# Patient Record
Sex: Male | Born: 1960 | Race: White | Hispanic: No | Marital: Married | Smoking: Former smoker
Health system: Southern US, Community
[De-identification: ages and names within clinical notes are randomized; demographics above are authoritative.]

## PROBLEM LIST (undated history)

## (undated) ENCOUNTER — Ambulatory Visit

## (undated) ENCOUNTER — Encounter

## (undated) ENCOUNTER — Encounter: Attending: Gastroenterology | Primary: Gastroenterology

## (undated) ENCOUNTER — Telehealth

## (undated) ENCOUNTER — Ambulatory Visit: Payer: BLUE CROSS/BLUE SHIELD | Attending: Gastroenterology | Primary: Gastroenterology

## (undated) ENCOUNTER — Telehealth
Attending: Pharmacist Clinician (PhC)/ Clinical Pharmacy Specialist | Primary: Pharmacist Clinician (PhC)/ Clinical Pharmacy Specialist

## (undated) ENCOUNTER — Encounter
Attending: Pharmacist Clinician (PhC)/ Clinical Pharmacy Specialist | Primary: Pharmacist Clinician (PhC)/ Clinical Pharmacy Specialist

## (undated) ENCOUNTER — Ambulatory Visit: Payer: BLUE CROSS/BLUE SHIELD

## (undated) ENCOUNTER — Ambulatory Visit: Attending: Hematology & Oncology | Primary: Hematology & Oncology

## (undated) ENCOUNTER — Encounter
Attending: Student in an Organized Health Care Education/Training Program | Primary: Student in an Organized Health Care Education/Training Program

## (undated) ENCOUNTER — Ambulatory Visit: Payer: PRIVATE HEALTH INSURANCE

## (undated) ENCOUNTER — Ambulatory Visit: Attending: Medical | Primary: Medical

## (undated) ENCOUNTER — Encounter: Attending: Internal Medicine | Primary: Internal Medicine

## (undated) DIAGNOSIS — E785 Hyperlipidemia, unspecified: Secondary | ICD-10-CM

## (undated) DIAGNOSIS — I4891 Unspecified atrial fibrillation: Secondary | ICD-10-CM

## (undated) DIAGNOSIS — K8020 Calculus of gallbladder without cholecystitis without obstruction: Secondary | ICD-10-CM

## (undated) DIAGNOSIS — K219 Gastro-esophageal reflux disease without esophagitis: Secondary | ICD-10-CM

## (undated) DIAGNOSIS — G510 Bell's palsy: Secondary | ICD-10-CM

## (undated) DIAGNOSIS — M109 Gout, unspecified: Secondary | ICD-10-CM

## (undated) DIAGNOSIS — I10 Essential (primary) hypertension: Secondary | ICD-10-CM

## (undated) DIAGNOSIS — M1990 Unspecified osteoarthritis, unspecified site: Secondary | ICD-10-CM

## (undated) DIAGNOSIS — G4730 Sleep apnea, unspecified: Secondary | ICD-10-CM

## (undated) DIAGNOSIS — K635 Polyp of colon: Secondary | ICD-10-CM

## (undated) DIAGNOSIS — K922 Gastrointestinal hemorrhage, unspecified: Secondary | ICD-10-CM

## (undated) DIAGNOSIS — D696 Thrombocytopenia, unspecified: Secondary | ICD-10-CM

## (undated) DIAGNOSIS — R519 Headache, unspecified: Secondary | ICD-10-CM

## (undated) DIAGNOSIS — E119 Type 2 diabetes mellitus without complications: Secondary | ICD-10-CM

## (undated) DIAGNOSIS — I864 Gastric varices: Secondary | ICD-10-CM

## (undated) DIAGNOSIS — G8929 Other chronic pain: Secondary | ICD-10-CM

## (undated) DIAGNOSIS — K7460 Unspecified cirrhosis of liver: Secondary | ICD-10-CM

## (undated) HISTORY — PX: BACK SURGERY: SHX140

## (undated) HISTORY — DX: Gastric varices: I86.4

## (undated) HISTORY — PX: OTHER SURGICAL HISTORY: SHX169

## (undated) HISTORY — PX: KNEE SURGERY: SHX244

## (undated) HISTORY — PX: CARDIAC VALVE REPLACEMENT: SHX585

## (undated) HISTORY — PX: ABDOMINAL SURGERY: SHX537

## (undated) HISTORY — PX: NASAL SINUS SURGERY: SHX719

## (undated) HISTORY — DX: Calculus of gallbladder without cholecystitis without obstruction: K80.20

## (undated) HISTORY — PX: HERNIA REPAIR: SHX51

## (undated) HISTORY — PX: COLON SURGERY: SHX602

## (undated) HISTORY — PX: APPENDECTOMY: SHX54

## (undated) HISTORY — PX: JOINT REPLACEMENT: SHX530

## (undated) HISTORY — PX: CARDIAC CATHETERIZATION: SHX172

---

## 2012-09-04 DIAGNOSIS — M109 Gout, unspecified: Secondary | ICD-10-CM | POA: Insufficient documentation

## 2012-09-04 DIAGNOSIS — F5101 Primary insomnia: Secondary | ICD-10-CM | POA: Insufficient documentation

## 2012-09-04 DIAGNOSIS — G43009 Migraine without aura, not intractable, without status migrainosus: Secondary | ICD-10-CM | POA: Insufficient documentation

## 2012-09-04 DIAGNOSIS — G4733 Obstructive sleep apnea (adult) (pediatric): Secondary | ICD-10-CM | POA: Insufficient documentation

## 2012-09-04 DIAGNOSIS — K219 Gastro-esophageal reflux disease without esophagitis: Secondary | ICD-10-CM | POA: Insufficient documentation

## 2012-09-04 DIAGNOSIS — Z96652 Presence of left artificial knee joint: Secondary | ICD-10-CM | POA: Insufficient documentation

## 2012-10-03 DIAGNOSIS — E1142 Type 2 diabetes mellitus with diabetic polyneuropathy: Secondary | ICD-10-CM | POA: Insufficient documentation

## 2012-10-03 DIAGNOSIS — E1165 Type 2 diabetes mellitus with hyperglycemia: Secondary | ICD-10-CM | POA: Insufficient documentation

## 2013-01-12 DIAGNOSIS — I10 Essential (primary) hypertension: Secondary | ICD-10-CM | POA: Insufficient documentation

## 2013-04-23 DIAGNOSIS — R7401 Elevation of levels of liver transaminase levels: Secondary | ICD-10-CM | POA: Insufficient documentation

## 2013-04-23 DIAGNOSIS — E1142 Type 2 diabetes mellitus with diabetic polyneuropathy: Secondary | ICD-10-CM | POA: Insufficient documentation

## 2013-10-29 DIAGNOSIS — H04121 Dry eye syndrome of right lacrimal gland: Secondary | ICD-10-CM | POA: Insufficient documentation

## 2014-01-23 DIAGNOSIS — R251 Tremor, unspecified: Secondary | ICD-10-CM | POA: Insufficient documentation

## 2014-01-23 DIAGNOSIS — E785 Hyperlipidemia, unspecified: Secondary | ICD-10-CM | POA: Insufficient documentation

## 2014-01-23 DIAGNOSIS — E1169 Type 2 diabetes mellitus with other specified complication: Secondary | ICD-10-CM | POA: Insufficient documentation

## 2015-05-30 DIAGNOSIS — M5416 Radiculopathy, lumbar region: Secondary | ICD-10-CM | POA: Insufficient documentation

## 2015-07-30 DIAGNOSIS — M79606 Pain in leg, unspecified: Secondary | ICD-10-CM | POA: Insufficient documentation

## 2015-07-30 DIAGNOSIS — M545 Low back pain, unspecified: Secondary | ICD-10-CM | POA: Insufficient documentation

## 2015-11-03 DIAGNOSIS — R29810 Facial weakness: Secondary | ICD-10-CM | POA: Insufficient documentation

## 2015-11-13 DIAGNOSIS — R519 Headache, unspecified: Secondary | ICD-10-CM | POA: Insufficient documentation

## 2016-04-16 DIAGNOSIS — L659 Nonscarring hair loss, unspecified: Secondary | ICD-10-CM | POA: Insufficient documentation

## 2018-10-15 ENCOUNTER — Other Ambulatory Visit: Payer: Self-pay

## 2018-10-15 ENCOUNTER — Ambulatory Visit
Admission: EM | Admit: 2018-10-15 | Discharge: 2018-10-15 | Disposition: A | Discharge status: Home / Self Care | Payer: 59

## 2018-10-15 ENCOUNTER — Encounter: Payer: Self-pay | Admitting: Emergency Medicine

## 2018-10-15 DIAGNOSIS — M5442 Lumbago with sciatica, left side: Secondary | ICD-10-CM | POA: Diagnosis not present

## 2018-10-15 HISTORY — DX: Type 2 diabetes mellitus without complications: E11.9

## 2018-10-15 MED ORDER — PREDNISONE 10 MG PO TABS: ORAL_TABLET | ORAL | 0 refills | Status: DC

## 2018-10-15 MED ORDER — CYCLOBENZAPRINE HCL 10 MG PO TABS: 10.0000 mg | ORAL_TABLET | Freq: Two times a day (BID) | ORAL | 0 refills | Status: DC | PRN

## 2018-10-15 NOTE — ED Provider Notes (Signed)
[facility] ____________________________________________  Time seen: Approximately 3:41 PM  I have reviewed the triage vital signs and the nursing notes.   HISTORY  Chief Complaint Back Pain   HPI Juan Garza is a 58 y.o. male presenting for evaluation of 3 weeks of low back pain.  Patient reports he has a history of chronic back pain with 2 previous lumbar surgeries.  Reports lumbar fusion L4-S1.  Reports of the last 3 weeks he has been having pain to the low back, sometimes more on the right side and sometimes more on the left.  States having some intermittent pain described as a burning pain going down left posterior leg.  Denies paresthesias.  Denies urinary bowel retention or incontinence.  Denies fall or direct injury.  States this is been gradual and not abrupt onset.  No fevers or rash.  States he has been taken over-the-counter Tylenol and ibuprofen which does help and it will ease up some but no resolution.  States feels like similar previous flareup.  Continues to eat and drink well.  Reports otherwise doing well.  No recent fever or sickness.  Denies chest pain or shortness of breath.   Past Medical History:  Diagnosis Date  . Diabetes mellitus without complication ([place])     There are no active problems to display for this patient.   Past Surgical History:  Procedure Laterality Date  . ABDOMINAL SURGERY    . APPENDECTOMY    . BACK SURGERY    . HERNIA REPAIR    . KNEE SURGERY Left      No current facility-administered medications for this encounter.   Current Outpatient Medications:  .  atorvastatin (LIPITOR) 10 MG tablet, Take 10 mg by mouth daily., Disp: , Rfl:  .  empagliflozin (JARDIANCE) 10 MG TABS tablet, Take 10 mg by mouth daily., Disp: , Rfl:  .  gabapentin (NEURONTIN) 300 MG capsule, Take 300 mg by mouth at bedtime., Disp: , Rfl:  .  glipiZIDE (GLUCOTROL) 10 MG tablet, Take 10 mg by mouth daily before breakfast., Disp: , Rfl:  .   lisinopril (ZESTRIL) 10 MG tablet, Take 10 mg by mouth daily., Disp: , Rfl:  .  omeprazole (PRILOSEC) 40 MG capsule, Take 40 mg by mouth daily., Disp: , Rfl:  .  propranolol (INDERAL) 20 MG tablet, Take 20 mg by mouth 2 (two) times daily., Disp: , Rfl:  .  sitaGLIPtin-metformin (JANUMET) 50-1000 MG tablet, Take 1 tablet by mouth daily., Disp: , Rfl:  .  cyclobenzaprine (FLEXERIL) 10 MG tablet, Take 1 tablet (10 mg total) by mouth 2 (two) times daily as needed for muscle spasms. Do not drive while taking as can cause drowsiness, Disp: 15 tablet, Rfl: 0 .  predniSONE (DELTASONE) 10 MG tablet, Start 60 mg po day one, then 50 mg po day two, taper by 10 mg daily until complete., Disp: 21 tablet, Rfl: 0  Allergies Imitrex [sumatriptan], Seroquel [quetiapine fumarate], and Adhesive [tape]  Family History  Problem Relation Age of Onset  . Atrial fibrillation Mother   . Diabetes Father     Social History Social History   Tobacco Use  . Smoking status: Former Research scientist (life sciences)  . Smokeless tobacco: Never Used  Substance Use Topics  . Alcohol use: Yes  . Drug use: Never    Review of Systems Constitutional: No fever. ENT: No sore throat. Cardiovascular: Denies chest pain. Respiratory: Denies shortness of breath. Gastrointestinal: No abdominal pain.  No nausea, no vomiting.  No diarrhea.  No constipation. Genitourinary: Negative for dysuria. Musculoskeletal: Positive for back pain. Skin: Negative for rash. Neurological: Negative for focal weakness or numbness.  ____________________________________________   PHYSICAL EXAM:  VITAL SIGNS: ED Triage Vitals  Enc Vitals Group     BP 10/15/18 1425 (!) 143/83     Pulse Rate 10/15/18 1425 70     Resp 10/15/18 1425 16     Temp 10/15/18 1425 98.4 F (36.9 C)     Temp Source 10/15/18 1425 Oral     SpO2 10/15/18 1425 99 %     Weight 10/15/18 1418 275 lb (124.7 kg)     Height 10/15/18 1418 6\' 2"  (1.88 m)     Head Circumference --      Peak Flow --       Pain Score 10/15/18 1418 6     Pain Loc --      Pain Edu? --      Excl. in [place]? --     Constitutional: Alert and oriented. Well appearing and in no acute distress. Eyes: Conjunctivae are normal.  ENT      Head: Normocephalic and atraumatic. Cardiovascular: Normal rate, regular rhythm. Grossly normal heart sounds.  Good peripheral circulation. Respiratory: Normal respiratory effort without tachypnea nor retractions. Breath sounds are clear and equal bilaterally. No wheezes, rales, rhonchi. Gastrointestinal: Soft and nontender. No CVA tenderness. Musculoskeletal: No midline cervical or thoracic tenderness palpation.  Bilateral pedal pulses equal and easily palpated.   Except: Mild midline lower lumbar tenderness palpation over surgical scar, no right tenderness, left paralumbar tenderness and over greater sciatic notch, no rash, no edema, minimal pain with right and left lumbar rotation, pain with flexion and extension but full range of motion present.  No saddle anesthesia.  Changes positions quickly.  No pain with standing bilateral knee lifts. Neurologic:  Normal speech and language. No gross focal neurologic deficits are appreciated. Speech is normal. No gait instability.  Skin:  Skin is warm, dry and intact. No rash noted. Psychiatric: Mood and affect are normal. Speech and behavior are normal. Patient exhibits appropriate insight and judgment   ___________________________________________   LABS (all labs ordered are listed, but only abnormal results are displayed)  Labs Reviewed - No data to display   PROCEDURES Procedures    INITIAL IMPRESSION / ASSESSMENT AND PLAN / ED COURSE  Pertinent labs & imaging results that were available during my care of the patient were reviewed by me and considered in my medical decision making (see chart for details).  Well-appearing patient.  No acute distress.  Low back pain acute on chronic.  No trauma, will defer x-ray, patient agrees.   Will treat with prednisone and PRN Flexeril, patient reports he is taken these medications in the past and done well.  Ice, heat, stretching.  Supportive care.Discussed indication, risks and benefits of medications with patient.  Discussed follow up with Primary care physician this week. Discussed follow up and return parameters including no resolution or any worsening concerns. Patient verbalized understanding and agreed to plan.   ____________________________________________   FINAL CLINICAL IMPRESSION(S) / ED DIAGNOSES  Final diagnoses:  Acute midline low back pain with left-sided sciatica     ED Discharge Orders         Ordered    predniSONE (DELTASONE) 10 MG tablet     10/15/18 1513    cyclobenzaprine (FLEXERIL) 10 MG tablet  2 times daily PRN     10/15/18 1513  Note: This dictation was prepared with Dragon dictation along with smaller phrase technology. Any transcriptional errors that result from this process are unintentional.         Marylene Land, NP 10/15/18 1549

## 2018-10-15 NOTE — Discharge Instructions (Signed)
Take medication as prescribed. Rest. Drink plenty of fluids. Ice/Heat.   Follow up with your primary care physician this week as needed. Return to Urgent care for new or worsening concerns.

## 2018-10-15 NOTE — ED Triage Notes (Signed)
Patient c/o lower back pain for the past 3 weeks.  Patient denies injury or fall.

## 2018-12-18 ENCOUNTER — Ambulatory Visit
Admission: EM | Admit: 2018-12-18 | Discharge: 2018-12-18 | Disposition: A | Discharge status: Home / Self Care | Payer: 59 | Attending: Family Medicine | Admitting: Family Medicine

## 2018-12-18 ENCOUNTER — Other Ambulatory Visit: Payer: Self-pay

## 2018-12-18 DIAGNOSIS — M5416 Radiculopathy, lumbar region: Secondary | ICD-10-CM | POA: Diagnosis not present

## 2018-12-18 MED ORDER — METAXALONE 800 MG PO TABS: 800.0000 mg | ORAL_TABLET | Freq: Three times a day (TID) | ORAL | 0 refills | Status: DC

## 2018-12-18 MED ORDER — MELOXICAM 15 MG PO TABS: 15.0000 mg | ORAL_TABLET | Freq: Every day | ORAL | 0 refills | Status: DC

## 2018-12-18 MED ORDER — KETOROLAC TROMETHAMINE 60 MG/2ML IM SOLN
60.0000 mg | Freq: Once | INTRAMUSCULAR | Status: AC
  Administered 2018-12-18: 60 mg via INTRAMUSCULAR

## 2018-12-18 NOTE — ED Triage Notes (Signed)
Pt states he fell 3 weeks ago onto concrete. Pain to middle of back and shooting down left leg.

## 2018-12-18 NOTE — ED Provider Notes (Signed)
[facility]    CSN: [id number] Arrival date & time: 12/18/18  0931      History   Chief Complaint Chief Complaint  Patient presents with  . Fall    HPI Juan Garza is a 58 y.o. male.   HPI  38-old male presents with low back pain with radiation into his left lower extremity.  He states that he fell 3 weeks ago onto concrete.  He had swelling of his left leg for quite some time but that has since subsided.  Now he is having continued back pain with radicular symptoms.  He is diabetic.  He has a previous history of back surgery.  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  He denies any bowel or bladder dysfunction       Past Medical History:  Diagnosis Date  . Diabetes mellitus without complication ([place])     There are no active problems to display for this patient.   Past Surgical History:  Procedure Laterality Date  . ABDOMINAL SURGERY    . APPENDECTOMY    . BACK SURGERY    . HERNIA REPAIR    . KNEE SURGERY Left        Home Medications    Prior to Admission medications   Medication Sig Start Date End Date Taking? Authorizing Provider  finasteride (PROSCAR) 5 MG tablet Take 5 mg by mouth daily.   Yes [provider]  atorvastatin (LIPITOR) 10 MG tablet Take 10 mg by mouth daily.    [provider]  empagliflozin (JARDIANCE) 10 MG TABS tablet Take 10 mg by mouth daily.    [provider]  gabapentin (NEURONTIN) 300 MG capsule Take 300 mg by mouth at bedtime.    [provider]  glipiZIDE (GLUCOTROL) 10 MG tablet Take 10 mg by mouth daily before breakfast.    [provider]  lisinopril (ZESTRIL) 10 MG tablet Take 10 mg by mouth daily.    [provider]  meloxicam (MOBIC) 15 MG tablet Take 1 tablet (15 mg total) by mouth daily. 12/18/18   Lorin Picket, PA-C  metaxalone (SKELAXIN) 800 MG tablet Take 1 tablet (800 mg total)  by mouth 3 (three) times daily. 12/18/18   Lorin Picket, PA-C  omeprazole (PRILOSEC) 40 MG capsule Take 40 mg by mouth daily.    [provider]  propranolol (INDERAL) 20 MG tablet Take 20 mg by mouth 2 (two) times daily.    [provider]  sitaGLIPtin-metformin (JANUMET) 50-1000 MG tablet Take 1 tablet by mouth daily.    [provider]    Family History Family History  Problem Relation Age of Onset  . Atrial fibrillation Mother   . Diabetes Father     Social History Social History   Tobacco Use  . Smoking status: Former Research scientist (life sciences)  . Smokeless tobacco: Never Used  Substance Use Topics  . Alcohol use: Yes    Comment: social  . Drug use: Never     Allergies   Imitrex [sumatriptan], Seroquel [quetiapine fumarate], and Adhesive [tape]   Review of Systems Review of Systems  Constitutional: Positive for activity change. Negative for appetite change, chills and fatigue.  Musculoskeletal: Positive for back pain and myalgias.  All other systems reviewed and are negative.    Physical Exam Triage Vital Signs ED Triage Vitals  Enc Vitals Group     BP 12/18/18 0949 (!) 163/81  Pulse Rate 12/18/18 0949 75     Resp 12/18/18 0949 18     Temp 12/18/18 0949 98.1 F (36.7 C)     Temp Source 12/18/18 0949 Oral     SpO2 12/18/18 0949 100 %     Weight 12/18/18 0945 281 lb (127.5 kg)     Height 12/18/18 0945 6\' 3"  (1.905 m)     Head Circumference --      Peak Flow --      Pain Score 12/18/18 0945 7     Pain Loc --      Pain Edu? --      Excl. in [place]? --    No data found.  Updated Vital Signs BP (!) 163/81 (BP Location: Right Arm)   Pulse 75   Temp 98.1 F (36.7 C) (Oral)   Resp 18   Ht 6\' 3"  (1.905 m)   Wt 281 lb (127.5 kg)   SpO2 100%   BMI 35.12 kg/m   Visual Acuity Right Eye Distance:   Left Eye Distance:   Bilateral Distance:    Right Eye Near:   Left Eye Near:    Bilateral Near:     Physical Exam Vitals signs and nursing  note reviewed.  Constitutional:      Appearance: Normal appearance.  Eyes:     Conjunctiva/sclera: Conjunctivae normal.  Neck:     Musculoskeletal: Normal range of motion and neck supple.  Cardiovascular:     Rate and Rhythm: Normal rate and regular rhythm.     Heart sounds: Normal heart sounds.  Pulmonary:     Effort: Pulmonary effort is normal.     Breath sounds: Normal breath sounds.  Musculoskeletal: Normal range of motion.     Comments: Examination of the lumbar spine shows the patient having limited range of motion with pain able to forward flex with his hands to the level of his mid thighs.  With resuming upright posture is more difficult.  Lateral flexion is painful bilaterally.  He is able to toe and heel walk.  DTR shows a absent knee jerk on the left.  It is 2+/4 on the right.  Sensation shows a hip esthesia to light touch over the medial left calf.  Straight leg raise testing on the right is cross positisve  causing left-sided low back and leg pain.  90 degrees bilaterally negative on the left.  Skin:    General: Skin is warm and dry.  Neurological:     General: No focal deficit present.     Mental Status: He is alert and oriented to person, place, and time.  Psychiatric:        Mood and Affect: Mood normal.        Behavior: Behavior normal.        Thought Content: Thought content normal.        Judgment: Judgment normal.      UC Treatments / Results  Labs (all labs ordered are listed, but only abnormal results are displayed) Labs Reviewed - No data to display  EKG   Radiology No results found.  Procedures Procedures (including critical care time)  Medications Ordered in UC Medications  ketorolac (TORADOL) injection 60 mg (60 mg Intramuscular Given 12/18/18 1026)    Initial Impression / Assessment and Plan / UC Course  I have reviewed the triage vital signs and the nursing notes.  Pertinent labs & imaging results that were available during my care of the  patient were reviewed by  me and considered in my medical decision making (see chart for details).   58 year old male presents with low back pain with radiation into the left lower extremity.  He fell approximate 3 weeks ago and has recovered mostly except for his back.  He was given an injection of Toradol.  I will also treat him conservatively with Mobic and Skelaxin.  He should follow-up with his primary care physician if he is not improving.   Final Clinical Impressions(s) / UC Diagnoses   Final diagnoses:  Left lumbar radiculopathy     Discharge Instructions     Avoid symptoms as much as possible.  Sitting lifting or bending are the worst things and are likely to exacerbate your back pain.  If you are not improving or worsening ,return to our clinic.    ED Prescriptions    Medication Sig Dispense Auth. Provider   meloxicam (MOBIC) 15 MG tablet Take 1 tablet (15 mg total) by mouth daily. 30 tablet Lorin Picket, PA-C   metaxalone (SKELAXIN) 800 MG tablet Take 1 tablet (800 mg total) by mouth 3 (three) times daily. 21 tablet Lorin Picket, PA-C     PDMP not reviewed this encounter.   Lorin Picket, PA-C 12/18/18 1735

## 2018-12-18 NOTE — Discharge Instructions (Addendum)
Avoid symptoms as much as possible.  Sitting lifting or bending are the worst things and are likely to exacerbate your back pain.  If you are not improving or worsening ,return to our clinic.

## 2018-12-22 ENCOUNTER — Ambulatory Visit
Admission: EM | Admit: 2018-12-22 | Discharge: 2018-12-22 | Disposition: A | Discharge status: Home / Self Care | Payer: 59 | Attending: Emergency Medicine | Admitting: Emergency Medicine

## 2018-12-22 ENCOUNTER — Encounter: Payer: Self-pay | Admitting: Emergency Medicine

## 2018-12-22 ENCOUNTER — Ambulatory Visit (INDEPENDENT_AMBULATORY_CARE_PROVIDER_SITE_OTHER): Payer: 59

## 2018-12-22 ENCOUNTER — Other Ambulatory Visit: Payer: Self-pay

## 2018-12-22 DIAGNOSIS — M545 Low back pain: Secondary | ICD-10-CM

## 2018-12-22 DIAGNOSIS — M25552 Pain in left hip: Secondary | ICD-10-CM

## 2018-12-22 DIAGNOSIS — M5442 Lumbago with sciatica, left side: Secondary | ICD-10-CM | POA: Diagnosis not present

## 2018-12-22 DIAGNOSIS — W19XXXD Unspecified fall, subsequent encounter: Secondary | ICD-10-CM

## 2018-12-22 DIAGNOSIS — M79605 Pain in left leg: Secondary | ICD-10-CM

## 2018-12-22 MED ORDER — TIZANIDINE HCL 4 MG PO TABS: 4.0000 mg | ORAL_TABLET | Freq: Three times a day (TID) | ORAL | 0 refills | Status: DC | PRN

## 2018-12-22 MED ORDER — NAPROXEN 500 MG PO TABS: 500.0000 mg | ORAL_TABLET | Freq: Two times a day (BID) | ORAL | 0 refills | Status: DC

## 2018-12-22 MED ORDER — METHYLPREDNISOLONE 4 MG PO TBPK: ORAL_TABLET | Freq: Every day | ORAL | 0 refills | Status: DC

## 2018-12-22 NOTE — ED Provider Notes (Signed)
HPI  SUBJECTIVE:  Juan Garza is a 58 y.o. male who presents with persistent left low back pain with radiation down his left leg.  He describes it as sharp, stabbing, constant, states that he can "barely walk" secondary to the pain.  Patient had a mechanical fall 3 weeks ago onto concrete, landing on his left knee and buttock.  States that his foot slipped out from underneath him.  Patient was seen here 4 days ago for this back pain,  given Toradol with significant improvement in his symptoms, sent home with Mobic and Skelaxin.  No imaging done on that visit.  He states that he has been taking the Mobic, Skelaxin, and taking Tylenol and ibuprofen as well.  Symptoms are better with lying flat, worse with sitting, standing, walking, lying on his right side.  No urinary or fecal incontinence, urinary retention, fevers, saddle anesthesia.  No leg weakness.  No numbness or tingling in his leg.  Patient states that his hip is "okay".  He has a past medical history of diabetes, hypertension, 2 lumbar spinal fusions, left knee replacement.  He is not on any anticoagulants or antiplatelet medications.  PMD: In [place].  None here.   Past Medical History:  Diagnosis Date  . Diabetes mellitus without complication [facility])     Past Surgical History:  Procedure Laterality Date  . ABDOMINAL SURGERY    . APPENDECTOMY    . BACK SURGERY    . HERNIA REPAIR    . KNEE SURGERY Left     Family History  Problem Relation Age of Onset  . Atrial fibrillation Mother   . Diabetes Father     Social History   Tobacco Use  . Smoking status: Former Research scientist (life sciences)  . Smokeless tobacco: Never Used  Substance Use Topics  . Alcohol use: Yes    Comment: social  . Drug use: Never    No current facility-administered medications for this encounter.   Current Outpatient Medications:  .  atorvastatin (LIPITOR) 10 MG tablet, Take 10 mg by mouth daily., Disp: , Rfl:  .  empagliflozin (JARDIANCE) 10 MG TABS tablet, Take 10 mg  by mouth daily., Disp: , Rfl:  .  finasteride (PROSCAR) 5 MG tablet, Take 5 mg by mouth daily., Disp: , Rfl:  .  gabapentin (NEURONTIN) 300 MG capsule, Take 300 mg by mouth at bedtime., Disp: , Rfl:  .  glipiZIDE (GLUCOTROL) 10 MG tablet, Take 10 mg by mouth daily before breakfast., Disp: , Rfl:  .  lisinopril (ZESTRIL) 10 MG tablet, Take 10 mg by mouth daily., Disp: , Rfl:  .  omeprazole (PRILOSEC) 40 MG capsule, Take 40 mg by mouth daily., Disp: , Rfl:  .  propranolol (INDERAL) 20 MG tablet, Take 20 mg by mouth 2 (two) times daily., Disp: , Rfl:  .  sitaGLIPtin-metformin (JANUMET) 50-1000 MG tablet, Take 1 tablet by mouth daily., Disp: , Rfl:  .  methylPREDNISolone (MEDROL DOSEPAK) 4 MG TBPK tablet, Take by mouth daily. Follow package instructions, Disp: 21 tablet, Rfl: 0 .  naproxen (NAPROSYN) 500 MG tablet, Take 1 tablet (500 mg total) by mouth 2 (two) times daily., Disp: 20 tablet, Rfl: 0 .  tiZANidine (ZANAFLEX) 4 MG tablet, Take 1 tablet (4 mg total) by mouth every 8 (eight) hours as needed for muscle spasms., Disp: 30 tablet, Rfl: 0  Allergies  Allergen Reactions  . Imitrex [Sumatriptan] Other (See Comments)    shaking  . Seroquel [Quetiapine Fumarate] Other (See Comments)  Leg spasms  . Adhesive [Tape] Rash     ROS  As noted in HPI.   Physical Exam  BP (!) 149/78 (BP Location: Left Arm)   Pulse 64   Temp 97.7 F (36.5 C) (Oral)   Resp 16   Ht 6\' 2"  (1.88 m)   Wt 127 kg   SpO2 100%   BMI 35.95 kg/m   Constitutional: Well developed, well nourished, appears uncomfortable. Eyes:  EOMI, conjunctiva normal bilaterally HENT: Normocephalic, atraumatic,mucus membranes moist Respiratory: Normal inspiratory effort Cardiovascular: Normal rate GI: nondistended skin: No rash, skin intact Musculoskeletal: Healed L-spine surgical scar. - paralumbar, QL tenderness,  -paralumbar muscle spasm. No bony tenderness.  No Tenderness over the SI joint.  Tenderness left lateral hip and  along IT band. intact DP pulses, CR<2 secs all digits bilaterally.  Pain with left internal hip rotation, flexion extension at the knee, flexion extension at the hip.  SLR positive left side.  Sensation baseline light touch bilaterally for Pt, DTR's KJ intact bilaterally, slightly diminished on the left.  Motor symmetric bilateral 5/5 hip flexion, quadriceps, hamstrings, EHL, foot dorsiflexion, foot plantarflexion, gait antalgic but without apparent new ataxia.  Knee nontender. Neurologic: Alert & oriented x 3, no focal neuro deficits Psychiatric: Speech and behavior appropriate   ED Course   Medications - No data to display  Orders Placed This Encounter  Procedures  . DG Hip Unilat With Pelvis 2-3 Views Left    Standing Status:   Standing    Number of Occurrences:   1    Order Specific Question:   Symptom/Reason for Exam    Answer:   Leg pain, diffuse, left [[id number]]    Order Specific Question:   Call Results- Best Contact Number?    Answer:   [phone]    Order Specific Question:   Radiology Contrast Protocol - do NOT remove file path    Answer:   [email or web address]  . DG Lumbar Spine Complete    Standing Status:   Standing    Number of Occurrences:   1    Order Specific Question:   Reason for Exam (SYMPTOM  OR DIAGNOSIS REQUIRED)    Answer:   fall 3 weeks ago L radicular leg pain. s/p 2 lumbar fusions    No results found for this or any previous visit (from the past 24 hour(s)). Dg Lumbar Spine Complete  Result Date: 12/22/2018 CLINICAL DATA:  Pain status post fall EXAM: LUMBAR SPINE - COMPLETE 4+ VIEW COMPARISON:  None. FINDINGS: The patient is status post prior L3 through L5 posterior fusion. The hardware appears intact. There are interbody spacers at the L3-L4 and L4-L5 levels. There is no acute displaced fracture. No dislocation. Multilevel facet arthrosis is noted throughout the lower lumbar spine. IMPRESSION: No acute osseous  abnormality. Electronically Signed   By: Constance Holster M.D.   On: 12/22/2018 18:24   Dg Hip Unilat With Pelvis 2-3 Views Left  Result Date: 12/22/2018 CLINICAL DATA:  Left hip pain after fall 3 weeks ago. Intense occasion over the last 2 days. EXAM: DG HIP (WITH OR WITHOUT PELVIS) 2-3V LEFT COMPARISON:  None. FINDINGS: Amorphous calcification just above the left greater trochanter. Spurring of both femoral heads. Preserved articular space in the hips. No well-defined fracture. IMPRESSION: 1. Amorphous calcification just above the left greater trochanter, suspicious for calcific tendinopathy. 2. Mild degenerative spurring of both femoral heads. 3. No acute bony findings are identified. Electronically Signed   By: Cindra Eves.D.  On: 12/22/2018 18:23    ED Clinical Impression  1. Acute left-sided low back pain with left-sided sciatica   2. Leg pain, diffuse, left   3. Fall, subsequent encounter      ED Assessment/Plan  Previous records reviewed.  As noted in HPI.  Will x-ray L-spine and hip.  Will reevaluate.  Patient took ibuprofen and Mobic today within 6 hours of evaluation, with holding Toradol.  [place] Narcotic database reviewed for this patient, and feel that the risk/benefit ratio today is favorable for proceeding with a prescription for controlled substance.  No opiate prescriptions since 2019.  Reviewed imaging independently. L-spine hardware intact.  No acute displaced fracture.  No acute bony findings left hip.  See radiology report for full details.  Patient has responded to NSAIDs in the past, states that the Toradol that he got last time really helped him.   Will discontinue the Mobic, try Naprosyn 500 mg twice daily with a Tylenol containing product.  will discontinue the Skelaxin, try Zanaflex.  We discussed the risks and benefits of doing a steroid taper, patient agreed to try a taper.  Advised him that this will elevate his sugars.  He will need to follow-up with a  neurosurgeon if not getting any better.  Providing information for several practices in [place].  Work note return on Tuesday.  He is not working over the weekend.   will also provide primary care list for ongoing care.  Discussed imaging, MDM, treatment plan, and plan for follow-up with patient. Discussed sn/sx that should prompt return to the ED. patient agrees with plan.   Meds ordered this encounter  Medications  . tiZANidine (ZANAFLEX) 4 MG tablet    Sig: Take 1 tablet (4 mg total) by mouth every 8 (eight) hours as needed for muscle spasms.    Dispense:  30 tablet    Refill:  0  . naproxen (NAPROSYN) 500 MG tablet    Sig: Take 1 tablet (500 mg total) by mouth 2 (two) times daily.    Dispense:  20 tablet    Refill:  0  . methylPREDNISolone (MEDROL DOSEPAK) 4 MG TBPK tablet    Sig: Take by mouth daily. Follow package instructions    Dispense:  21 tablet    Refill:  0    *This clinic note was created using Dragon dictation software. Therefore, there may be occasional mistakes despite careful proofreading.   ?    Melynda Ripple, MD 12/22/18 1900

## 2018-12-22 NOTE — Discharge Instructions (Addendum)
Your x-rays were negative for any acute fractures today.  The hardware seems to be in place.  Stop the Mobic and the Skelaxin.  We are going to try 500 mg of Naprosyn twice a day.  You may take a Tylenol containing product 4 times a day.  Take 2 extra strength, or 1000 mg of Tylenol with the Naprosyn for mild to moderate pain or 1-2 Norco for severe pain.  You can take either plain Tylenol or d the Norco an additional 2 times a day.  Do not take Tylenol and Norco as they both have Tylenol in them and too much Tylenol can hurt your liver.  The steroids will elevate your glucose.  Keep a close eye on this.  Stop the Skelaxin, try the Zanaflex.  Fortunately, there are no neurosurgeons in [place], however there are several excellent practices in [place].  Give them a call if you are not better in 5 to 7 days.  Here is a list of primary care providers who are taking new patients:  Dr. Otilio Miu, Dr. Adline Potter [address] [place] [address] [place] [address]  [phone]  [facility] [address] [place], [place] [address] [phone]  [facility]  [phone] [place], [place] [address]  Here are clinics/ other resources who will see you if you do not have insurance. Some have certain criteria that you must meet. Call them and find out what they are:  [facility]: [address], [place], [place] [address] Phone: [phone] Hours: First and Third Saturdays of each Month, 9 a.m. - 1 p.m.  [facility]: [address] [place], [place], [place] [address] Phone: [phone] Hours: Tuesday, 4 p.m. - 8 p.m. Thursday, 1 p.m. - 8 p.m. Wednesday, 9 a.m. - [facility] [address] [place], [place] [address] Phone: [phone] Pharmacy Phone Number: [phone] Dental Phone Number: [phone] [place] Help:  [phone]  Dental Hours: Monday - Thursday, 8 a.m. - 6 p.m.  [facility] [address] [place], [place] [address] Phone: [phone] Pharmacy Phone Number: [phone] [facility] Insurance Help: [phone]  [facility] [address] [place], [place] [address] Phone: [phone] Pharmacy Phone Number: [phone] [facility] Insurance Help: [phone]  [facility] [address] [place], [place] [address] Phone: [phone] [facility] Insurance Help: [phone]   [address] [place], [place] [address] Phone: [phone]  Go to www.goodrx.com to look up your medications. This will give you a list of where you can find your prescriptions at the most affordable prices. Or ask the pharmacist what the cash price is, or if they have any other discount programs available to help make your medication more affordable. This can be less expensive than what you would pay with insurance.

## 2018-12-22 NOTE — ED Triage Notes (Signed)
Patient c/o left leg pain that has not improved since he was seen back on 11/9 for fall and left leg injury.

## 2018-12-25 ENCOUNTER — Encounter
Admit: 2018-12-25 | Discharge: 2018-12-26 | Disposition: A | Discharge status: Home / Self Care | Payer: PRIVATE HEALTH INSURANCE

## 2018-12-25 DIAGNOSIS — M549 Dorsalgia, unspecified: Principal | ICD-10-CM

## 2018-12-25 MED ORDER — OXYCODONE 5 MG TABLET
ORAL_TABLET | Freq: Four times a day (QID) | ORAL | 0 refills | 2 days | Status: CP | PRN
Start: 2018-12-25 — End: 2018-12-30

## 2018-12-25 MED ORDER — LIDOCAINE 5 % TOPICAL PATCH
MEDICATED_PATCH | TRANSDERMAL | 0 refills | 14.00000 days | Status: CP
Start: 2018-12-25 — End: 2019-01-24

## 2019-01-03 DIAGNOSIS — R29898 Other symptoms and signs involving the musculoskeletal system: Secondary | ICD-10-CM | POA: Insufficient documentation

## 2019-01-03 DIAGNOSIS — Z981 Arthrodesis status: Secondary | ICD-10-CM | POA: Insufficient documentation

## 2019-02-09 HISTORY — PX: LUMBAR DISC SURGERY: SHX700

## 2019-08-08 ENCOUNTER — Encounter: Payer: Self-pay | Admitting: Emergency Medicine

## 2019-08-08 ENCOUNTER — Ambulatory Visit
Admission: EM | Admit: 2019-08-08 | Discharge: 2019-08-08 | Disposition: A | Discharge status: Home / Self Care | Payer: Commercial Managed Care - PPO | Attending: Family Medicine | Admitting: Family Medicine

## 2019-08-08 ENCOUNTER — Other Ambulatory Visit: Payer: Self-pay

## 2019-08-08 DIAGNOSIS — E11628 Type 2 diabetes mellitus with other skin complications: Secondary | ICD-10-CM | POA: Diagnosis not present

## 2019-08-08 DIAGNOSIS — L089 Local infection of the skin and subcutaneous tissue, unspecified: Secondary | ICD-10-CM

## 2019-08-08 MED ORDER — CLINDAMYCIN HCL 150 MG PO CAPS: 450.0000 mg | ORAL_CAPSULE | Freq: Three times a day (TID) | ORAL | 0 refills | Status: AC

## 2019-08-08 MED ORDER — MUPIROCIN 2 % EX OINT: 1.0000 "application " | TOPICAL_OINTMENT | Freq: Two times a day (BID) | CUTANEOUS | 0 refills | Status: AC

## 2019-08-08 NOTE — Discharge Instructions (Signed)
Keep a close eye.  Antibiotic as prescribed.  If worsens, go to the ER.  Take care  Dr. Lacinda Axon

## 2019-08-08 NOTE — ED Provider Notes (Signed)
[facility]    CSN: [id number] Arrival date & time: 08/08/19  6237  History   Chief Complaint Chief Complaint  Patient presents with  . Toe Pain    right great toe   HPI  59 year old male presents with the above complaint.  Patient is diabetic.  Patient reports that over the past 4 to 5 days he has had what he believes is an infection of his right great toe.  He has an area of callus with a central opening and some drainage.  Surrounding erythema.  Rates his pain as 1/10 in severity.  He has been applying topical antibiotic ointment without resolution.  No fever.  No other associated symptoms.  No other complaints.  Past Medical History:  Diagnosis Date  . Diabetes mellitus without complication ([place])   Hyperlipidemia  hypertension  Past Surgical History:  Procedure Laterality Date  . ABDOMINAL SURGERY    . APPENDECTOMY    . BACK SURGERY    . HERNIA REPAIR    . KNEE SURGERY Left     Home Medications    Prior to Admission medications   Medication Sig Start Date End Date Taking? Authorizing Provider  atorvastatin (LIPITOR) 10 MG tablet Take 10 mg by mouth daily.   Yes [provider]  dutasteride (AVODART) 0.5 MG capsule Take by mouth. 12/18/18  Yes [provider]  empagliflozin (JARDIANCE) 10 MG TABS tablet Take 10 mg by mouth daily.   Yes [provider]  eszopiclone (LUNESTA) 2 MG TABS tablet Take by mouth. 04/26/19  Yes [provider]  finasteride (PROSCAR) 5 MG tablet Take 5 mg by mouth daily.   Yes [provider]  glipiZIDE (GLUCOTROL) 10 MG tablet Take 10 mg by mouth daily before breakfast.   Yes [provider]  lisinopril (ZESTRIL) 10 MG tablet Take 10 mg by mouth daily.   Yes [provider]  naproxen (NAPROSYN) 500 MG tablet Take 1 tablet (500 mg total) by mouth 2 (two) times daily. 12/22/18  Yes Melynda Ripple, MD  omeprazole (PRILOSEC) 40 MG capsule Take 40 mg by mouth daily.   Yes  [provider]  pregabalin (LYRICA) 75 MG capsule Take by mouth. 01/18/19  Yes [provider]  propranolol (INDERAL) 20 MG tablet Take 20 mg by mouth 2 (two) times daily.   Yes [provider]  Semaglutide,0.25 or 0.5MG /DOS, (OZEMPIC, 0.25 OR 0.5 MG/DOSE,) 2 MG/1.5ML SOPN Inject into the skin. 05/22/19  Yes [provider]  clindamycin (CLEOCIN) 150 MG capsule Take 3 capsules (450 mg total) by mouth 3 (three) times daily for 10 days. 08/08/19 08/18/19  Coral Spikes, DO  mupirocin ointment (BACTROBAN) 2 % Apply 1 application topically 2 (two) times daily for 7 days. 08/08/19 08/15/19  Coral Spikes, DO  gabapentin (NEURONTIN) 300 MG capsule Take 300 mg by mouth at bedtime.  08/08/19  [provider]  sitaGLIPtin-metformin (JANUMET) 50-1000 MG tablet Take 1 tablet by mouth daily.  08/08/19  [provider]    Family History Family History  Problem Relation Age of Onset  . Atrial fibrillation Mother   . Diabetes Father     Social History Social History   Tobacco Use  . Smoking status: Former Research scientist (life sciences)  . Smokeless tobacco: Never Used  Vaping Use  . Vaping Use: Never used  Substance Use Topics  . Alcohol use: Yes    Comment: social  . Drug use: Never     Allergies   Imitrex [  sumatriptan], Seroquel [quetiapine fumarate], and Adhesive [tape]   Review of Systems Review of Systems  Constitutional: Negative for fever.  Skin: Positive for wound.   Physical Exam Triage Vital Signs ED Triage Vitals  Enc Vitals Group     BP 08/08/19 0856 134/69     Pulse Rate 08/08/19 0856 64     Resp 08/08/19 0856 18     Temp 08/08/19 0856 98.2 F (36.8 C)     Temp Source 08/08/19 0856 Oral     SpO2 08/08/19 0856 98 %     Weight 08/08/19 0853 260 lb (117.9 kg)     Height 08/08/19 0853 6\' 2"  (1.88 m)     Head Circumference --      Peak Flow --      Pain Score 08/08/19 0853 1     Pain Loc --      Pain Edu? --      Excl. in [place]? --    Updated  Vital Signs BP 134/69 (BP Location: Right Arm)   Pulse 64   Temp 98.2 F (36.8 C) (Oral)   Resp 18   Ht 6\' 2"  (1.88 m)   Wt 117.9 kg   SpO2 98%   BMI 33.38 kg/m   Visual Acuity Right Eye Distance:   Left Eye Distance:   Bilateral Distance:    Right Eye Near:   Left Eye Near:    Bilateral Near:     Physical Exam Vitals and nursing note reviewed.  Constitutional:      General: He is not in acute distress.    Appearance: Normal appearance. He is not ill-appearing.  HENT:     Head: Normocephalic and atraumatic.  Eyes:     General:        Right eye: No discharge.        Left eye: No discharge.     Conjunctiva/sclera: Conjunctivae normal.  Pulmonary:     Effort: Pulmonary effort is normal. No respiratory distress.  Feet:     Comments: Right great toe -medial aspect of the toe with callus with a central opening.  Small amount of pus exuded with pressure.  Surrounding erythema. Neurological:     Mental Status: He is alert.  Psychiatric:        Mood and Affect: Mood normal.        Behavior: Behavior normal.    UC Treatments / Results  Labs (all labs ordered are listed, but only abnormal results are displayed) Labs Reviewed - No data to display  EKG   Radiology No results found.  Procedures Procedures (including critical care time)  Medications Ordered in UC Medications - No data to display  Initial Impression / Assessment and Plan / UC Course  I have reviewed the triage vital signs and the nursing notes.  Pertinent labs & imaging results that were available during my care of the patient were reviewed by me and considered in my medical decision making (see chart for details).    59 year old male presents with a diabetic foot infection.  Treating with clindamycin and Bactroban ointment.  Final Clinical Impressions(s) / UC Diagnoses   Final diagnoses:  Diabetic foot infection [facility])     Discharge Instructions     Keep a close eye.  Antibiotic as  prescribed.  If worsens, go to the ER.  Take care  Dr. Lacinda Axon    ED Prescriptions    Medication Sig Dispense Auth. Provider   clindamycin (CLEOCIN) 150 MG capsule Take 3  capsules (450 mg total) by mouth 3 (three) times daily for 10 days. 90 capsule Myria Steenbergen G, DO   mupirocin ointment (BACTROBAN) 2 % Apply 1 application topically 2 (two) times daily for 7 days. 22 g Coral Spikes, DO     PDMP not reviewed this encounter.   Coral Spikes, [place] 08/08/19 [phone]

## 2019-08-08 NOTE — ED Triage Notes (Signed)
Patient c/o infection in right great toe x 4-5 days. He thinks it may have started with a spider bite.

## 2019-10-09 DIAGNOSIS — L649 Androgenic alopecia, unspecified: Secondary | ICD-10-CM | POA: Insufficient documentation

## 2019-10-09 DIAGNOSIS — Z79899 Other long term (current) drug therapy: Secondary | ICD-10-CM | POA: Insufficient documentation

## 2019-11-26 NOTE — Progress Notes (Signed)
[facility]  [address] [place], [place] [address] Phone: [phone]  Fax: [phone]   Clinic Day:  11/27/2019  Referring physician: Toni Arthurs, NP  Chief Complaint: Juan Garza is a 59 y.o. male with leukopenia and thrombocytopenia who is referred in consultation by Toni Arthurs, NP for assessment and management.   HPI: The patient saw Toni Arthurs, NP on 11/14/2019 for a follow up regarding foot pain. Hematocrit was 43.3, hemoglobin 14.2, MCV 85.9, platelets 77,000, WBC 3,600 (ANC 2,000).  CBC followed: 04/28/2010:  Hematocrit 37.4, hemoglobin 13.0, MCV 87.8, platelets 173,000, WBC 12,700. 12/29/2012:  Hematocrit 47.9, hemoglobin 16.1, MCV 92.6, platelets 164,000, WBC   5,890. 01/16/2014:  Hematocrit 48.7, hemoglobin 17.0, MCV 87.4, platelets 135,000, WBC   5,820. 12/04/2014:  Hematocrit 49.3, hemoglobin 17.3, NCV 88.2, platelets 158,000, WBC   7,700. 07/22/2015:  Hematocrit 39.4, hemoglobin 13.8, MCV 87.9, platelets 122,000, WBC   8,300. 09/24/2016:  Hematocrit 48.0, hemoglobin 16.7, MCV 87.8, platelets 103,000, WBC   5,930. 12/26/2017:  Hematocrit 45.8, hemoglobin 15.3, MCV 88.8, platelets   78,000, WBC   4,480. 05/28/2019:  Hematocrit 36.6, hemoglobin 10.7, MCV 77.1, platelets   84,000, WBC   3,290 ([place] 1,960). 10/17/2019:  Hematocrit 44.1, hemoglobin 14.0, MCV 85.5, platelets   78,000, WBC   3,800 ([place] 2,180).  11/14/2019:  Hematocrit 43.3, hemoglobin 14.2, MCV 85.9, platelets   77,000, WBC   3,600 (ANC 2,000).  Additional labs:  Ferritin was 9 on 05/28/2019 and 19 on 10/17/2019. Iron saturation was 6% and TIBC was 467 on 05/28/2019. Reticulocyte count was 1.34% on 10/17/2019. Sed rate was 2 and CRP was 1 on 11/14/2019.  SPEP was normal on 10/17/2019. Vitamin B12 was 838 on 10/17/2019.  At home hemoccult test was positive on 10/25/2019.  The patient had Bell's palsy in 2017. He has a history of upper GI bleed felt related to  ibuprofen in 06/2019.  The patient's father had a stroke.  Symptomatically, he has been "ok".  He reports occasional headaches, appetite loss, weight loss (40 lbs in 6 months), nosebleeds once per week, reflux, occasional blood in the stool, right/middle abdominal pain, dark stools, and nausea. He has pain in his left 2nd finger and hips. He denies any issues with infections.  The patient denies fevers, sweats, changes in vision, runny nose, sore throat, cough, shortness of breath, chest pain, palpitations, vomiting, diarrhea, urinary symptoms, skin changes, numbness, weakness, balance or coordination problems, and bleeding of any kind.  The patient follows a gluten-free diet. He eats a lot of rice and beans, cheesecakes, applesauce, ice cream, and milk. He eats red meat 3-4 x per week. He started taking oral iron once daily in the beginning of 10/2019. He had previously taken it twice daily. He denies ice pica or any other cravings. He reports early satiety.  He had a problem with one of the discs in his back about a year ago and was taking a lot of ibuprofen and Tylenol. He went in for routine labs in 05/2019 and "everything came back low." He has an appointment with Dr. Alice Reichert next week.  He fractured his right foot in 10/2019 but it was not diagnosed until 2 weeks ago.  He had a partial colectomy and appendectomy in 2007 due to a polyp.  He does not take any herbal products. He has diabetes. He has never had a transfusion. He denies a family history or blood disorders or cancer.  He denies a history of hepatitis. He consents  to HIV and hepatitis testing.   Past Medical History:  Diagnosis Date  . Diabetes mellitus without complication [place])     Past Surgical History:  Procedure Laterality Date  . ABDOMINAL SURGERY    . APPENDECTOMY    . BACK SURGERY    . HERNIA REPAIR    . KNEE SURGERY Left     Family History  Problem Relation Age of Onset  . Atrial fibrillation Mother   .  Diabetes Father     Social History:  reports that he has quit smoking. He has never used smokeless tobacco. He reports current alcohol use. He reports that he does not use drugs. He smoked 1.5 packs per day for ~20 years and quit in 1996. He denies drug use. He drinks 3 drinks weekly, usually rum and coke. 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 He lives in [place]. 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 The patient is alone today.  Allergies:  Allergies  Allergen Reactions  . Imitrex [Sumatriptan] Other (See Comments)    shaking  . Seroquel [Quetiapine Fumarate] Other (See Comments)    Leg spasms  . Adhesive [Tape] Rash    Current Medications: Current Outpatient Medications  Medication Sig Dispense Refill  . atorvastatin (LIPITOR) 10 MG tablet Take 10 mg by mouth daily.    Marland Kitchen dutasteride (AVODART) 0.5 MG capsule Take by mouth.    . empagliflozin (JARDIANCE) 10 MG TABS tablet Take 10 mg by mouth daily.    . eszopiclone (LUNESTA) 2 MG TABS tablet Take by mouth.    . finasteride (PROSCAR) 5 MG tablet Take 5 mg by mouth daily.    Marland Kitchen lisinopril (ZESTRIL) 10 MG tablet Take 10 mg by mouth daily.    Marland Kitchen omeprazole (PRILOSEC) 40 MG capsule Take 40 mg by mouth daily.    . pregabalin (LYRICA) 75 MG capsule Take by mouth.    . propranolol (INDERAL) 20 MG tablet Take 20 mg by mouth 2 (two) times daily.    . Semaglutide,0.25 or 0.5MG /DOS, (OZEMPIC, 0.25 OR 0.5 MG/DOSE,) 2 MG/1.5ML SOPN Inject into the skin.    Marland Kitchen glipiZIDE (GLUCOTROL) 10 MG tablet Take 10 mg by mouth daily before breakfast. (Patient not taking: Reported on 11/27/2019)    . naproxen (NAPROSYN) 500 MG tablet Take 1 tablet (500 mg total) by mouth 2 (two) times daily. (Patient not taking: Reported on 11/27/2019) 20 tablet 0   No current facility-administered medications for this visit.    Review of Systems  Constitutional: Positive for weight loss (40 lbs). Negative for chills, diaphoresis, fever and malaise/fatigue.  HENT:  Positive for nosebleeds (once a week). Negative for congestion, ear discharge, ear pain, hearing loss, sinus pain, sore throat and tinnitus.   Eyes: Negative for blurred vision and double vision.  Respiratory: Negative for cough, hemoptysis, sputum production and shortness of breath.   Cardiovascular: Negative for chest pain, palpitations and leg swelling.  Gastrointestinal: Positive for abdominal pain (center or right side), blood in stool (occasional), heartburn (reflux), melena (on oral iron) and nausea. Negative for constipation, diarrhea and vomiting.       Poor appetite. Early satiety. Denies ice pica. Gluten-free diet.  Genitourinary: Negative for dysuria, frequency, hematuria and urgency.  Musculoskeletal: Positive for back pain and joint pain (hips, left 2nd finger). Negative for falls, myalgias and neck pain.       Fractured foot.  Skin: Negative for itching and rash.  Neurological: Positive for headaches. Negative for dizziness, tingling,  sensory change and weakness.  Endo/Heme/Allergies: Does not bruise/bleed easily.  Psychiatric/Behavioral: Negative for depression and memory loss. The patient is not nervous/anxious and does not have insomnia.   All other systems reviewed and are negative.  Performance status (ECOG): 1  Vitals Blood pressure 133/83, pulse 77, temperature (!) 97.1 F (36.2 C), temperature source Tympanic, resp. rate 18, height 6\' 2"  (1.88 m), weight 257 lb 11.5 oz (116.9 kg), SpO2 99 %.   Physical Exam Vitals and nursing note reviewed.  Constitutional:      General: He is not in acute distress.    Appearance: He is not diaphoretic.  HENT:     Head: Normocephalic and atraumatic.     Mouth/Throat:     Mouth: Mucous membranes are moist.     Pharynx: Oropharynx is clear.  Eyes:     General: No scleral icterus.    Extraocular Movements: Extraocular movements intact.     Conjunctiva/sclera: Conjunctivae normal.     Pupils: Pupils are equal, round, and reactive  to light.  Cardiovascular:     Rate and Rhythm: Normal rate and regular rhythm.     Heart sounds: Normal heart sounds. No murmur heard.   Pulmonary:     Effort: Pulmonary effort is normal. No respiratory distress.     Breath sounds: Normal breath sounds. No wheezing or rales.  Chest:     Chest wall: No tenderness.  Abdominal:     General: Bowel sounds are normal. There is no distension.     Palpations: Abdomen is soft. There is no hepatomegaly, splenomegaly or mass.     Tenderness: There is abdominal tenderness (RUQ). There is no guarding or rebound.  Musculoskeletal:        General: No swelling or tenderness. Normal range of motion.     Cervical back: Normal range of motion and neck supple.     Comments: Right foot fracture, wearing boot. Left axillary tenderness.  Lymphadenopathy:     Head:     Right side of head: No preauricular, posterior auricular or occipital adenopathy.     Left side of head: No preauricular, posterior auricular or occipital adenopathy.     Cervical: No cervical adenopathy.     Upper Body:     Right upper body: No supraclavicular or axillary adenopathy.     Left upper body: No supraclavicular or axillary adenopathy.     Lower Body: No right inguinal adenopathy. No left inguinal adenopathy.  Skin:    General: Skin is warm and dry.     Comments: Bruise on upper abdomen.  Neurological:     Mental Status: He is alert and oriented to person, place, and time.  Psychiatric:        Behavior: Behavior normal.        Thought Content: Thought content normal.        Judgment: Judgment normal.    No visits with results within 3 Day(s) from this visit.  Latest known visit with results is:  No results found for any previous visit.    Assessment:  Juan Garza is a 59 y.o. male with leukopenia and thrombocytopenia.  He denies any new medication or herbal product.  CBC on 11/14/2019 revealed a hematocrit 43.3, hemoglobin 14.2, MCV 85.9, platelets 77,000, WBC 3,600  (ANC 2,000).  SPEP was normal on 10/17/2019. Vitamin B12 was 838 on 10/17/2019.  TSH and free T4 were normal on 04/16/2019.  He has iron deficiency.  He has a history of upper GI bleed  felt related to ibuprofen in 06/2019. Ferritin was 9 on 05/28/2019 and 19 on 10/17/2019. Iron saturation was 6% and TIBC was 467 on 05/28/2019. Reticulocyte count was 1.34% on 10/17/2019. Sed rate was 2 and CRP was 1 on 11/14/2019.  At home hemoccult test was positive on 10/25/2019.  The patient received the [employer]good COVID-19 vaccine on 05/02/2019 and 05/23/2019.  Symptomatically, he notes appetite loss, weight loss (40 lbs in 6 months), nosebleeds once per week, occasional blood in the stool, right/middle abdominal pain, dark stools, and nausea.  Exam reveals no adenopathy or hepatosplenomegaly.  He follows a gluten-free diet. Exam reveals no adenopathy or hepatosplenomegaly.   Plan: 1.   Labs today:  CBC with diff, platelet count in a blue top tube, CMP, PT, PTT, hepatitis B core antibody total, hepatitis C antibody, HIV testing, ANA with reflex, H pylori serologies, folate, TSH, free T4, LDH. 2.   Peripheral smear for path review. 3.   Thrombocytopenia and leukopenia  Differential diagnosis includes: pseudothrombocytopenia, infections, autoimmune, medication, herbal products, alcohol, liver disease, vitamin deficiencies, marrow replacement.  Discuss work-up.  No new medications or herbal products implicated.  No history of recurrent infections. 4.   Iron deficiency  Patient has a h/o upper GI bleed related to ibuprofen in 06/2019.  Guaiac card was + on 10/25/2019.  Patient describes abdominal discomfort, appetite loss, and a 40 pound weight loss.  Patient is scheduled for a GI evaluation with Dr Alice Reichert.  Anticipate abdominal and pelvis CT in addition to endoscopies. 5.   RTC in 1 week for MD assessment, review of work-up and discussion regarding direction of therapy.  I discussed the assessment and treatment  plan with the patient.  The patient was provided an opportunity to ask questions and all were answered.  The patient agreed with the plan and demonstrated an understanding of the instructions.  The patient was advised to call back if the symptoms worsen or if the condition fails to improve as anticipated.  I provided 33 minutes of face-to-face time during this this encounter and > 50% was spent counseling as documented under my assessment and plan.  An additional 10 minutes were spent reviewing his chart (Epic and Care Everywhere) including notes, labs, and imaging studies.    Lubna Stegeman C. Mike Gip, MD, PhD    11/27/2019, 2:19 PM  I, Mirian Mo Tufford, am acting as Education administrator for [employer]. Mike Gip, MD, PhD.  I, Johnwilliam Shepperson C. Mike Gip, MD, have reviewed the above documentation for accuracy and completeness, and I agree with the above.

## 2019-11-27 ENCOUNTER — Other Ambulatory Visit: Payer: Self-pay

## 2019-11-27 ENCOUNTER — Inpatient Hospital Stay: Payer: Commercial Managed Care - PPO | Attending: Hematology and Oncology | Admitting: Hematology and Oncology

## 2019-11-27 ENCOUNTER — Inpatient Hospital Stay: Payer: Commercial Managed Care - PPO

## 2019-11-27 ENCOUNTER — Encounter: Payer: Self-pay | Admitting: Hematology and Oncology

## 2019-11-27 VITALS — BP 133/83 | HR 77 | Temp 97.1°F | Resp 18 | Ht 74.0 in | Wt 257.7 lb

## 2019-11-27 DIAGNOSIS — D696 Thrombocytopenia, unspecified: Secondary | ICD-10-CM | POA: Diagnosis present

## 2019-11-27 DIAGNOSIS — E119 Type 2 diabetes mellitus without complications: Secondary | ICD-10-CM | POA: Insufficient documentation

## 2019-11-27 DIAGNOSIS — D72819 Decreased white blood cell count, unspecified: Secondary | ICD-10-CM

## 2019-11-27 DIAGNOSIS — E611 Iron deficiency: Secondary | ICD-10-CM | POA: Diagnosis not present

## 2019-11-27 DIAGNOSIS — R630 Anorexia: Secondary | ICD-10-CM | POA: Insufficient documentation

## 2019-11-27 DIAGNOSIS — Z79899 Other long term (current) drug therapy: Secondary | ICD-10-CM | POA: Diagnosis not present

## 2019-11-27 DIAGNOSIS — D509 Iron deficiency anemia, unspecified: Secondary | ICD-10-CM

## 2019-11-27 DIAGNOSIS — R109 Unspecified abdominal pain: Secondary | ICD-10-CM | POA: Diagnosis not present

## 2019-11-27 DIAGNOSIS — R634 Abnormal weight loss: Secondary | ICD-10-CM

## 2019-11-27 LAB — CBC WITH DIFFERENTIAL/PLATELET
Abs Immature Granulocytes: 0.02 10*3/uL (ref 0.00–0.07)
Basophils Absolute: 0 10*3/uL (ref 0.0–0.1)
Basophils Relative: 1 %
Eosinophils Absolute: 0.1 10*3/uL (ref 0.0–0.5)
Eosinophils Relative: 2 %
HCT: 47.5 % (ref 39.0–52.0)
Hemoglobin: 15.3 g/dL (ref 13.0–17.0)
Immature Granulocytes: 0 %
Lymphocytes Relative: 20 %
Lymphs Abs: 1 10*3/uL (ref 0.7–4.0)
MCH: 27.4 pg (ref 26.0–34.0)
MCHC: 32.2 g/dL (ref 30.0–36.0)
MCV: 85.1 fL (ref 80.0–100.0)
Monocytes Absolute: 0.3 10*3/uL (ref 0.1–1.0)
Monocytes Relative: 6 %
Neutro Abs: 3.6 10*3/uL (ref 1.7–7.7)
Neutrophils Relative %: 71 %
Platelets: 82 10*3/uL — ABNORMAL LOW (ref 150–400)
RBC: 5.58 MIL/uL (ref 4.22–5.81)
RDW: 16.3 % — ABNORMAL HIGH (ref 11.5–15.5)
WBC: 5 10*3/uL (ref 4.0–10.5)
nRBC: 0 % (ref 0.0–0.2)

## 2019-11-27 LAB — COMPREHENSIVE METABOLIC PANEL
ALT: 45 U/L — ABNORMAL HIGH (ref 0–44)
AST: 45 U/L — ABNORMAL HIGH (ref 15–41)
Albumin: 4.3 g/dL (ref 3.5–5.0)
Alkaline Phosphatase: 64 U/L (ref 38–126)
Anion gap: 7 (ref 5–15)
BUN: 14 mg/dL (ref 6–20)
CO2: 26 mmol/L (ref 22–32)
Calcium: 9 mg/dL (ref 8.9–10.3)
Chloride: 104 mmol/L (ref 98–111)
Creatinine, Ser: 0.71 mg/dL (ref 0.61–1.24)
GFR, Estimated: >60 mL/min (ref >60)
Glucose, Bld: 134 mg/dL — ABNORMAL HIGH (ref 70–99)
Potassium: 4 mmol/L (ref 3.5–5.1)
Sodium: 137 mmol/L (ref 135–145)
Total Bilirubin: 1.8 mg/dL — ABNORMAL HIGH (ref 0.3–1.2)
Total Protein: 7.5 g/dL (ref 6.5–8.1)

## 2019-11-27 LAB — LACTATE DEHYDROGENASE: LDH: 176 U/L (ref 98–192)

## 2019-11-27 LAB — APTT: aPTT: 31 seconds (ref 24–36)

## 2019-11-27 LAB — PROTIME-INR
INR: 1.1 (ref 0.8–1.2)
Prothrombin Time: 14.1 seconds (ref 11.4–15.2)

## 2019-11-27 LAB — T4, FREE: Free T4: 0.7 ng/dL (ref 0.61–1.12)

## 2019-11-27 LAB — TSH: TSH: 1.579 u[IU]/mL (ref 0.350–4.500)

## 2019-11-27 LAB — PLATELET BY CITRATE: Platelet CT in Citrate: 81.4

## 2019-11-27 LAB — FOLATE: Folate: 6.8 ng/mL (ref >5.9)

## 2019-11-27 NOTE — Progress Notes (Signed)
The patient c/o lower back pain / right foot pain due to breaking it.  Pain level ( 4-5)

## 2019-11-28 LAB — H. PYLORI ANTIBODY, IGG: H Pylori IgG: 0.26 Index Value (ref 0.00–0.79)

## 2019-11-28 LAB — PATHOLOGIST SMEAR REVIEW

## 2019-11-28 LAB — HEPATITIS C ANTIBODY: HCV Ab: NONREACTIVE

## 2019-11-28 LAB — HIV ANTIBODY (ROUTINE TESTING W REFLEX): HIV Screen 4th Generation wRfx: NONREACTIVE

## 2019-11-28 LAB — ANA W/REFLEX: Anti Nuclear Antibody (ANA): NEGATIVE

## 2019-11-28 LAB — HEPATITIS B CORE ANTIBODY, TOTAL: Hep B Core Total Ab: NONREACTIVE

## 2019-12-02 LAB — COPPER, SERUM: Copper: 78 ug/dL (ref 69–132)

## 2019-12-03 NOTE — Progress Notes (Signed)
[facility]  [address] [place], [place] [address] Phone: [phone]  Fax: [phone]   Clinic Day:  12/04/2019  Referring physician: Ezequiel Kayser, MD  Chief Complaint: Juan Garza is a 59 y.o. male with leukopenia and thrombocytopenia who is seen for review of work-up and discussion regarding direction of therapy.  HPI: The patient was last seen in the hematology clinic on 11/27/2019 for new patient assessment. At that time, he was noted to have progressive thrombocytopenia since 07/2015 and a low WBC since 05/28/2019.  He denied any bleeding.  He was on a gluten free diet.  He denied any prior history of blood transfusions or hepatitis.  Work-up revealed a hematocrit of 47.5, hemoglobin 15.3, platelets 82,000, WBC 5,000. Platelet count in a citrate was 81,400.  AST was 45, ALT 45, and bilirubin 1.8. LDH was 176. Copper was 78 (normal). TSH was 1.579 with a free T4 of 0.70 (normal). Folate was 6.8. H pylori antibody was 0.26 (negative). ANA was negative. HIV antibody, hepatitis B core antibody, and hepatitis C antibody were negative.  PTT was 31 and INR 1.1.  Peripheral smear revealed mild thrombocytopenia without clumping, which has been persistent. RBCs were normochromic, with mild anisocytosis. There was no poikilocytosis or fragmentation.  There were no abnormal or immature leukocytes were identified.  During the interim, he has been "okay".  He denies any new concerns. He bruises easily. His weight has gone up slightly but he has intentionally been eating more. He still has back pain, hip pain, weekly nosebleeds, reflux, nausea, and black stools due to oral iron. His abdominal pain comes and goes. Denies any blood in the stool.  He has had an elevated bilirubin before but it always comes back down.    Past Medical History:  Diagnosis Date  . Diabetes mellitus without complication [facility])     Past Surgical History:  Procedure Laterality Date   . ABDOMINAL SURGERY    . APPENDECTOMY    . BACK SURGERY    . HERNIA REPAIR    . KNEE SURGERY Left     Family History  Problem Relation Age of Onset  . Atrial fibrillation Mother   . Diabetes Father     Social History:  reports that he has quit smoking. He has never used smokeless tobacco. He reports current alcohol use. He reports that he does not use drugs. He smoked 1.5 packs per day for ~20 years and quit in 1996. He denies drug use. He drinks 3 drinks weekly, usually rum and coke. He worked at a [employer] in his early 20s. He lives in [place]. 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 The patient is alone today.  Allergies:  Allergies  Allergen Reactions  . Imitrex [Sumatriptan] Other (See Comments)    shaking  . Seroquel [Quetiapine Fumarate] Other (See Comments)    Leg spasms  . Adhesive [Tape] Rash    Current Medications: Current Outpatient Medications  Medication Sig Dispense Refill  . atorvastatin (LIPITOR) 10 MG tablet Take 10 mg by mouth daily.    Marland Kitchen dutasteride (AVODART) 0.5 MG capsule Take by mouth.    . empagliflozin (JARDIANCE) 10 MG TABS tablet Take 10 mg by mouth daily.    . eszopiclone (LUNESTA) 2 MG TABS tablet Take by mouth.    . finasteride (PROSCAR) 5 MG tablet Take 5 mg by mouth daily.    Marland Kitchen lisinopril (ZESTRIL) 10 MG tablet Take 10 mg by mouth daily.    Marland Kitchen  omeprazole (PRILOSEC) 40 MG capsule Take 40 mg by mouth daily.    . pregabalin (LYRICA) 75 MG capsule Take by mouth.    . propranolol (INDERAL) 20 MG tablet Take 20 mg by mouth 2 (two) times daily.    . Semaglutide,0.25 or 0.5MG /DOS, (OZEMPIC, 0.25 OR 0.5 MG/DOSE,) 2 MG/1.5ML SOPN Inject into the skin.    Marland Kitchen glipiZIDE (GLUCOTROL) 10 MG tablet Take 10 mg by mouth daily before breakfast. (Patient not taking: Reported on 11/27/2019)    . naproxen (NAPROSYN) 500 MG tablet Take 1 tablet (500 mg total) by mouth 2 (two) times daily. (Patient not taking: Reported on 11/27/2019) 20 tablet 0   No current  facility-administered medications for this visit.    Review of Systems  Constitutional: Negative for chills, diaphoresis, fever, malaise/fatigue and weight loss (up 3 lbs).  HENT: Positive for nosebleeds (once a week). Negative for congestion, ear discharge, ear pain, hearing loss, sinus pain, sore throat and tinnitus.   Eyes: Negative for blurred vision and double vision.  Respiratory: Negative for cough, hemoptysis, sputum production and shortness of breath.   Cardiovascular: Negative for chest pain, palpitations and leg swelling.  Gastrointestinal: Positive for abdominal pain (comes and goes), heartburn (reflux), melena (on oral iron) and nausea. Negative for blood in stool, constipation, diarrhea and vomiting.       Trying to eat more.  Genitourinary: Negative for dysuria, frequency, hematuria and urgency.  Musculoskeletal: Positive for back pain and joint pain (hips, left 2nd finger). Negative for falls, myalgias and neck pain.  Skin: Negative for itching and rash.  Neurological: Negative for dizziness, tingling, sensory change, weakness and headaches.  Endo/Heme/Allergies: Bruises/bleeds easily.  Psychiatric/Behavioral: Negative for depression and memory loss. The patient is not nervous/anxious and does not have insomnia.   All other systems reviewed and are negative.   Performance status (ECOG): 1  Vitals Blood pressure 137/77, pulse 75, temperature 98.5 F (36.9 C), temperature source Tympanic, resp. rate 18, height 6\' 2"  (1.88 m), weight 260 lb 7.6 oz (118.2 kg), SpO2 98 %.   Physical Exam Vitals and nursing note reviewed.  Constitutional:      General: He is not in acute distress.    Appearance: He is not diaphoretic.  Eyes:     General: No scleral icterus.    Conjunctiva/sclera: Conjunctivae normal.     Comments: Blue eyes.  Neurological:     Mental Status: He is alert and oriented to person, place, and time.  Psychiatric:        Behavior: Behavior normal.         Thought Content: Thought content normal.        Judgment: Judgment normal.      No visits with results within 3 Day(s) from this visit.  Latest known visit with results is:  Appointment on 11/27/2019  Component Date Value Ref Range Status  . Path Review 11/27/2019 Blood smear is reviewed.   Final   Comment: There is mild thrombocytopenia without clumping, which has been persistent. RBCs are normochromic, with mild anisocytosis. There is no poikilocytosis or fragmentation. No abnormal or immature leukocytes are identified. Reviewed by Lemmie Evens. Dicie Beam, MD. Performed at [facility], [address] [place], [place] [address]   . H Pylori IgG 11/27/2019 0.26  0.00 - 0.79 Index Value Final   Comment: (NOTE)  Negative           <0.80                             Equivocal    0.80 - 0.89                             Positive           >0.89 Performed At: [facility] [place], [place] [id number] Rush Farmer MD [id number]   . LDH 11/27/2019 176  98 - 192 U/L Final   Performed at [facility], [address] [place], [place] [address]  . Copper 11/27/2019 78  69 - 132 ug/dL Final   Comment: (NOTE) This test was developed and its performance characteristics determined by [facility]. It has not been cleared or approved by the Food and Drug Administration.                                Detection Limit = 5 Performed At: [facility] [place], [place] [id number] Rush Farmer MD [id number]   . Free T4 11/27/2019 0.70  0.61 - 1.12 ng/dL Final   Comment: (NOTE) Biotin ingestion may interfere with free T4 tests. If the results are inconsistent with the TSH level, previous test results, or the clinical presentation, then consider biotin interference. If needed, order repeat testing after stopping biotin. Performed at [facility], [address] [place], [place] [address]   . TSH  11/27/2019 1.579  0.350 - 4.500 uIU/mL Final   Comment: Performed by a 3rd Generation assay with a functional sensitivity of <=0.01 uIU/mL. Performed at [facility] [address], [place], [place] [address]   . Folate 11/27/2019 6.8  >5.9 ng/mL Final   Performed at [facility], [address] [place], [place] [address]  . Anti Nuclear Antibody (ANA) 11/27/2019 Negative  Negative Final   Comment: (NOTE) Performed At: [facility] [place], [place] [id number] Rush Farmer MD [id number]   . HIV Screen 4th Generation wRfx 11/27/2019 Non Reactive  Non Reactive Final   Performed at [facility], [place] [address], [place], [place] [address]  . HCV Ab 11/27/2019 NON REACTIVE  NON REACTIVE Final   Comment: (NOTE) Nonreactive HCV antibody screen is consistent with no HCV infections,  unless recent infection is suspected or other evidence exists to indicate HCV infection.  Performed at [facility], [place] [address] [place], [place] [address]   . Hep B Core Total Ab 11/27/2019 NON REACTIVE  NON REACTIVE Final   Performed at [facility], [place] [address], [place], [place] [address]  . aPTT 11/27/2019 31  24 - 36 seconds Final   Performed at [facility], [address] [place], [place] [address]  . Prothrombin Time 11/27/2019 14.1  11.4 - 15.2 seconds Final  . INR 11/27/2019 1.1  0.8 - 1.2 Final   Comment: (NOTE) INR goal varies based on device and disease states. Performed at [facility], [address], [place], [place] [address]   . Platelet CT in Citrate 11/27/2019 81.4   Final   Comment: CONSISTANT WITH ORIGINAL PLATELET COUNT Performed at [facility], [address] [place], [place] [address]   . Sodium  11/27/2019 137  135 - 145 mmol/L Final  . Potassium 11/27/2019 4.0  3.5 - 5.1 mmol/L Final  . Chloride 11/27/2019 104  98 - 111 mmol/L Final  . CO2 11/27/2019 26  22 - 32 mmol/L  Final  . Glucose, Bld 11/27/2019 134* 70 - 99 mg/dL Final   Glucose reference range applies only to samples taken after fasting for at least 8 hours.  . BUN 11/27/2019 14  6 - 20 mg/dL Final  . Creatinine, Ser 11/27/2019 0.71  0.61 - 1.24 mg/dL Final  . Calcium 11/27/2019 9.0  8.9 - 10.3 mg/dL Final  . Total Protein 11/27/2019 7.5  6.5 - 8.1 g/dL Final  . Albumin 11/27/2019 4.3  3.5 - 5.0 g/dL Final  . AST 11/27/2019 45* 15 - 41 U/L Final  . ALT 11/27/2019 45* 0 - 44 U/L Final  . Alkaline Phosphatase 11/27/2019 64  38 - 126 U/L Final  . Total Bilirubin 11/27/2019 1.8* 0.3 - 1.2 mg/dL Final  . GFR, Estimated 11/27/2019 >60  >60 mL/min Final  . Anion gap 11/27/2019 7  5 - 15 Final   Performed at [facility], [address] [place], [place] [address]  . WBC 11/27/2019 5.0  4.0 - 10.5 K/uL Final  . RBC 11/27/2019 5.58  4.22 - 5.81 MIL/uL Final  . Hemoglobin 11/27/2019 15.3  13.0 - 17.0 g/dL Final  . HCT 11/27/2019 47.5  39 - 52 % Final  . MCV 11/27/2019 85.1  80.0 - 100.0 fL Final  . MCH 11/27/2019 27.4  26.0 - 34.0 pg Final  . MCHC 11/27/2019 32.2  30.0 - 36.0 g/dL Final  . RDW 11/27/2019 16.3* 11.5 - 15.5 % Final  . Platelets 11/27/2019 82* 150 - 400 K/uL Final   Comment: Immature Platelet Fraction may be clinically indicated, consider ordering this additional test [id number]   . nRBC 11/27/2019 0.0  0.0 - 0.2 % Final  . Neutrophils Relative % 11/27/2019 71  % Final  . Neutro Abs 11/27/2019 3.6  1.7 - 7.7 K/uL Final  . Lymphocytes Relative 11/27/2019 20  % Final  . Lymphs Abs 11/27/2019 1.0  0.7 - 4.0 K/uL Final  . Monocytes Relative 11/27/2019 6  % Final  . Monocytes Absolute 11/27/2019 0.3  0.1 - 1.0 K/uL Final  . Eosinophils Relative 11/27/2019 2  % Final  . Eosinophils Absolute 11/27/2019 0.1  0.0 - 0.5 K/uL Final  . Basophils Relative 11/27/2019 1  % Final  . Basophils Absolute 11/27/2019 0.0  0.0 - 0.1 K/uL Final  . Immature Granulocytes 11/27/2019 0  % Final    . Abs Immature Granulocytes 11/27/2019 0.02  0.00 - 0.07 K/uL Final   Performed at [facility], [address], [place], [place] [address]    Assessment:  Juan Garza is a 59 y.o. male with leukopenia and thrombocytopenia.  CBC on 11/14/2019 revealed a hematocrit 43.3, hemoglobin 14.2, MCV 85.9, platelets 77,000, WBC 3,600 (ANC 2,000).  SPEP was normal on 10/17/2019. Vitamin B12 was 838 on 10/17/2019.  TSH and free T4 were normal on 04/16/2019.  Work-up on 11/27/2019 revealed a hematocrit of 47.5, hemoglobin 15.3, platelets 82,000, WBC 5,000. Platelet count in a citrate was 81,400.  AST was 45, ALT 45, and bilirubin 1.8. Normal labs included PT (31), PT (14.1), LDH (176),  copper (78), TSH (1.579), free T4 (0.70), folate (6.8), ANA, H pylori antibody, HIV antibody, hepatitis B core antibody, and hepatitis C antibody were negative.  Peripheral smear revealed mild thrombocytopenia without clumping, which has been persistent. RBCs were normochromic, with mild anisocytosis. There was no poikilocytosis or fragmentation.  There were no abnormal or immature leukocytes were identified.  He has iron deficiency.  He has a history of upper GI bleed felt related to ibuprofen in 06/2019. Ferritin was 9 on 05/28/2019 and 19 on 10/17/2019. Iron saturation was 6% and TIBC was 467 on 05/28/2019. Reticulocyte count was 1.34% on 10/17/2019. Sed rate was 2 and CRP was 1 on 11/14/2019.  At home hemoccult test was positive on 10/25/2019.  The patient received the [place] COVID-19 vaccine on 05/02/2019 and 05/23/2019.  Symptomatically, he has felt "ok".  He bruises easily.  He has black stools due to oral iron. His abdominal pain comes and goes. He denies any blood in the stool.  Plan: 1.   Review work-up from 11/27/2019. 2   Thrombocytopenia and leukopenia             Platelet count 82,000.  WBC 5000 with a normal differential.    Leukopenia has resolved.  Peripheral smear revealed no  abnormalities.    Work-up revealed no evidence of pseudothrombocytopenia, autoimmune, infectious etiologies, thyroid dysfunction or copper deficiency.            No new medications or herbal products implicated.             No history of recurrent infections.  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   Check abdominal ultrasound to rule out splenomegaly (not c/w ITP).   We did tentatively discussed treatment options for ITP. 3.   Iron deficiency             Patient has a h/o upper GI bleed related to ibuprofen in 06/2019.             Guaiac card was + on 10/25/2019.             He notes a history of abdominal discomfort, appetite loss, and a 40 pound weight loss.             He is scheduled to see Dr. Alice Reichert for GI evaluation.             Given intermittent abdominal pain, would also proceed with abdominal and pelvis CT if endoscopies are negative. 4.   Abdominal ultrasound on 12/07/2019. 5.   RTC in 1 week for MD assessment, labs (CBC, LFTs, direct bilirubin, immature platelet fraction), and review of abdominal ultrasound.  I discussed the assessment and treatment plan with the patient.  The patient was provided an opportunity to ask questions and all were answered.  The patient agreed with the plan and demonstrated an understanding of the instructions.  The patient was advised to call back if the symptoms worsen or if the condition fails to improve as anticipated.   Charnee Turnipseed C. Mike Gip, MD, PhD    12/04/2019, 1:52 PM  I, Mirian Mo Tufford, am acting as Education administrator for [employer]. Mike Gip, MD, PhD.  I, Keanon Bevins C. Mike Gip, MD, have reviewed the above documentation for accuracy and completeness, and I agree with the above.

## 2019-12-04 ENCOUNTER — Other Ambulatory Visit: Payer: Self-pay

## 2019-12-04 ENCOUNTER — Inpatient Hospital Stay (HOSPITAL_BASED_OUTPATIENT_CLINIC_OR_DEPARTMENT_OTHER): Payer: Commercial Managed Care - PPO | Admitting: Hematology and Oncology

## 2019-12-04 ENCOUNTER — Encounter: Payer: Self-pay | Admitting: Hematology and Oncology

## 2019-12-04 VITALS — BP 137/77 | HR 75 | Temp 98.5°F | Resp 18 | Ht 74.0 in | Wt 260.5 lb

## 2019-12-04 DIAGNOSIS — D696 Thrombocytopenia, unspecified: Secondary | ICD-10-CM

## 2019-12-04 DIAGNOSIS — D72819 Decreased white blood cell count, unspecified: Secondary | ICD-10-CM

## 2019-12-04 DIAGNOSIS — D509 Iron deficiency anemia, unspecified: Secondary | ICD-10-CM | POA: Diagnosis not present

## 2019-12-04 NOTE — Progress Notes (Signed)
No new changes noted today 

## 2019-12-07 ENCOUNTER — Ambulatory Visit
Admission: RE | Admit: 2019-12-07 | Discharge: 2019-12-07 | Disposition: A | Discharge status: Home / Self Care | Payer: Commercial Managed Care - PPO | Source: Ambulatory Visit | Attending: Hematology and Oncology | Admitting: Hematology and Oncology

## 2019-12-07 ENCOUNTER — Other Ambulatory Visit: Payer: Self-pay

## 2019-12-07 DIAGNOSIS — D696 Thrombocytopenia, unspecified: Secondary | ICD-10-CM

## 2019-12-10 NOTE — Progress Notes (Signed)
[facility]  [address] [place], [place] [address] Phone: [phone]  Fax: [phone]   Clinic Day:  12/11/2019  Referring physician: Ezequiel Kayser, MD  Chief Complaint: Juan Garza is a 59 y.o. male with leukopenia and thrombocytopenia who is seen for review of interval abdominal ultrasound and discussion regarding direction of therapy.  HPI: The patient was last seen in the hematology clinic on 12/04/2019. At that time, he felt "ok".  He denied any excess bruising or bleeding.  Abdominal ultrasound on 12/07/2019 revealed diffusely increased echotexture throughout the liver compatible with fatty infiltration. Portal vein was patent on Doppler with normal direction of blood flow toward the liver.  There was splenomegaly (13.8 cm). There was cholelithiasis. There was no evidence of acute cholecystitis.  During the interim, he has been "ok".  He agrees to a bone marrow biopsy and states that he "would like to figure out what is going on."   Past Medical History:  Diagnosis Date  . Diabetes mellitus without complication [facility])     Past Surgical History:  Procedure Laterality Date  . ABDOMINAL SURGERY    . APPENDECTOMY    . BACK SURGERY    . HERNIA REPAIR    . KNEE SURGERY Left     Family History  Problem Relation Age of Onset  . Atrial fibrillation Mother   . Diabetes Father     Social History:  reports that he has quit smoking. He has never used smokeless tobacco. He reports current alcohol use. He reports that he does not use drugs. He smoked 1.5 packs per day for ~20 years and quit in 1996. He denies drug use. He drinks 3 drinks weekly, usually rum and coke. He worked at a [employer] in his early 70s. He lives in [place]. 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 The patient is alone today.  Allergies:  Allergies  Allergen Reactions  . Imitrex [Sumatriptan] Other (See Comments)    shaking  . Seroquel [Quetiapine Fumarate] Other (See  Comments)    Leg spasms  . Adhesive [Tape] Rash    Current Medications: Current Outpatient Medications  Medication Sig Dispense Refill  . atorvastatin (LIPITOR) 10 MG tablet Take 10 mg by mouth daily.    Marland Kitchen dutasteride (AVODART) 0.5 MG capsule Take by mouth.    . empagliflozin (JARDIANCE) 10 MG TABS tablet Take 10 mg by mouth daily.    . eszopiclone (LUNESTA) 2 MG TABS tablet Take by mouth.    . finasteride (PROSCAR) 5 MG tablet Take 5 mg by mouth daily.    Marland Kitchen glipiZIDE (GLUCOTROL) 10 MG tablet Take 10 mg by mouth daily before breakfast. (Patient not taking: Reported on 11/27/2019)    . lisinopril (ZESTRIL) 10 MG tablet Take 10 mg by mouth daily.    . naproxen (NAPROSYN) 500 MG tablet Take 1 tablet (500 mg total) by mouth 2 (two) times daily. (Patient not taking: Reported on 11/27/2019) 20 tablet 0  . omeprazole (PRILOSEC) 40 MG capsule Take 40 mg by mouth daily.    . pregabalin (LYRICA) 75 MG capsule Take by mouth.    . propranolol (INDERAL) 20 MG tablet Take 20 mg by mouth 2 (two) times daily.    . Semaglutide,0.25 or 0.5MG/DOS, (OZEMPIC, 0.25 OR 0.5 MG/DOSE,) 2 MG/1.5ML SOPN Inject into the skin.     No current facility-administered medications for this visit.    Review of Systems  Constitutional: Positive for weight loss (15 lbs). Negative for  chills, diaphoresis, fever and malaise/fatigue.       Feels "ok".  HENT: Positive for nosebleeds (once a week). Negative for congestion, ear discharge, ear pain, hearing loss, sinus pain, sore throat and tinnitus.   Eyes: Negative for blurred vision and double vision.  Respiratory: Negative for cough, hemoptysis, sputum production and shortness of breath.   Cardiovascular: Negative for chest pain, palpitations and leg swelling.  Gastrointestinal: Positive for abdominal pain (comes and goes), heartburn (reflux), melena (on oral iron) and nausea. Negative for blood in stool, constipation, diarrhea and vomiting.       Trying to eat more.    Genitourinary: Negative for dysuria, frequency, hematuria and urgency.  Musculoskeletal: Positive for back pain and joint pain (hips, left 2nd finger). Negative for falls, myalgias and neck pain.  Skin: Negative for itching and rash.  Neurological: Negative for dizziness, tingling, sensory change, weakness and headaches.  Endo/Heme/Allergies: Bruises/bleeds easily.  Psychiatric/Behavioral: Negative for depression and memory loss. The patient is not nervous/anxious and does not have insomnia.   All other systems reviewed and are negative.  Performance status (ECOG): 1  Vitals Blood pressure 128/78, pulse 76, resp. rate 18, height 6' 2" (1.88 m), weight 245 lb 14.8 oz (111.5 kg), SpO2 98 %.   Physical Exam Vitals and nursing note reviewed.  Constitutional:      General: He is not in acute distress.    Appearance: He is not diaphoretic.  HENT:     Head:     Comments: Short gray hair. Eyes:     General: No scleral icterus.    Conjunctiva/sclera: Conjunctivae normal.     Comments: Blue eyes.  Neurological:     Mental Status: He is alert and oriented to person, place, and time.  Psychiatric:        Behavior: Behavior normal.        Thought Content: Thought content normal.        Judgment: Judgment normal.    Appointment on 12/11/2019  Component Date Value Ref Range Status  . Immature Platelet Fraction 12/11/2019 7.9  1.2 - 8.6 % Final   Performed at [facility], [address] [place], [place] [address]  . WBC 12/11/2019 4.3  4.0 - 10.5 K/uL Final  . RBC 12/11/2019 5.42  4.22 - 5.81 MIL/uL Final  . Hemoglobin 12/11/2019 14.8  13.0 - 17.0 g/dL Final  . HCT 12/11/2019 46.1  39 - 52 % Final  . MCV 12/11/2019 85.1  80.0 - 100.0 fL Final  . MCH 12/11/2019 27.3  26.0 - 34.0 pg Final  . MCHC 12/11/2019 32.1  30.0 - 36.0 g/dL Final  . RDW 12/11/2019 16.3* 11.5 - 15.5 % Final  . Platelets 12/11/2019 77* 150 - 400 K/uL Final  . nRBC 12/11/2019 0.0  0.0 - 0.2 % Final  .  Neutrophils Relative % 12/11/2019 58  % Final  . Neutro Abs 12/11/2019 2.5  1.7 - 7.7 K/uL Final  . Lymphocytes Relative 12/11/2019 31  % Final  . Lymphs Abs 12/11/2019 1.3  0.7 - 4.0 K/uL Final  . Monocytes Relative 12/11/2019 8  % Final  . Monocytes Absolute 12/11/2019 0.3  0.1 - 1.0 K/uL Final  . Eosinophils Relative 12/11/2019 2  % Final  . Eosinophils Absolute 12/11/2019 0.1  0.0 - 0.5 K/uL Final  . Basophils Relative 12/11/2019 1  % Final  . Basophils Absolute 12/11/2019 0.0  0.0 - 0.1 K/uL Final  . Immature Granulocytes 12/11/2019 0  % Final  .  Abs Immature Granulocytes 12/11/2019 0.01  0.00 - 0.07 K/uL Final   Performed at [facility], [address] [place], [place] [address]  . Total Protein 12/11/2019 6.7  6.5 - 8.1 g/dL Final  . Albumin 12/11/2019 4.0  3.5 - 5.0 g/dL Final  . AST 12/11/2019 34  15 - 41 U/L Final  . ALT 12/11/2019 35  0 - 44 U/L Final  . Alkaline Phosphatase 12/11/2019 59  38 - 126 U/L Final  . Total Bilirubin 12/11/2019 1.5* 0.3 - 1.2 mg/dL Final  . Bilirubin, Direct 12/11/2019 0.3* 0.0 - 0.2 mg/dL Final  . Indirect Bilirubin 12/11/2019 1.2* 0.3 - 0.9 mg/dL Final   Performed at [facility], [address] [place], [place] [address]    Assessment:  Juan Garza is a 59 y.o. male with leukopenia and thrombocytopenia.  CBC on 11/14/2019 revealed a hematocrit 43.3, hemoglobin 14.2, MCV 85.9, platelets 77,000, WBC 3,600 (ANC 2,000).  SPEP was normal on 10/17/2019. Vitamin B12 was 838 on 10/17/2019.  TSH and free T4 were normal on 04/16/2019.  Work-up on 11/27/2019 revealed a hematocrit of 47.5, hemoglobin 15.3, platelets 82,000, WBC 5,000. Platelet count in a citrate was 81,400.  AST was 45, ALT 45, and bilirubin 1.8. Normal labs included PT (31), PT (14.1), LDH (176),  copper (78), TSH (1.579), free T4 (0.70), folate (6.8), ANA, H pylori antibody, HIV antibody, hepatitis B core antibody, and hepatitis C antibody were negative.     Peripheral smear revealed mild thrombocytopenia without clumping, which has been persistent. RBCs were normochromic, with mild anisocytosis. There was no poikilocytosis or fragmentation.  There were no abnormal or immature leukocytes were identified.  Abdominal ultrasound on 12/07/2019 revealed diffusely increased echotexture throughout the liver c/w fatty infiltration. Portal vein was patent on Doppler with normal direction of blood flow toward the liver.  There was splenomegaly (13.8 cm). There was cholelithiasis. There was no evidence of acute cholecystitis.  He has iron deficiency.  He has a history of upper GI bleed felt related to ibuprofen in 06/2019. Ferritin was 9 on 05/28/2019 and 19 on 10/17/2019. Iron saturation was 6% and TIBC was 467 on 05/28/2019. Reticulocyte count was 1.34% on 10/17/2019. Sed rate was 2 and CRP was 1 on 11/14/2019.  At home hemoccult test was positive on 10/25/2019.  The patient received the [place] COVID-19 vaccine on 05/02/2019 and 05/23/2019.  Symptomatically, he feels "ok".  Platelets are 77,000.  Bilirubin is 1.5 (direct 0.3).  Plan: 1.   Labs today: CBC, LFTs, direct bilirubin, immature platelet fraction. 2.   Thrombocytopenia and leukopenia  Platelet count is 77,000 and WBC is 4300 ([place] 2500) today.  Ensure platelet fraction is 7.9% (normal range) suggesting a possible marrow issue.  Prior work-up suggests no evidence of pseudothrombocytopenia, autoimmune disease, infectious etiology, thyroid dysfunction or copper deficiency.  Spleen is mildly enlarged at 13.8 cm.  Discuss marrow aspirate and biopsy.  Description of procedure was reviewed in detail.     Risk versus benefits were reviewed.   Patient consented to procedure. 3.   Iron deficiency  [place] card was + on 10/25/2019. He notes a history of abdominal discomfort, appetite loss, and a 40 pound weight loss.  He is scheduled to see Dr. Alice Reichert for GI evaluation on  12/19/2019. Anticipate abdominal and pelvis CT if endoscopies are negative. 4.   Bone marrow biopsy on 12/17/2019. 5.   RTC 10 days after bone marrow for MD assessment, review of work-up and discussion  regarding direction of therapy.  I discussed the assessment and treatment plan with the patient.  The patient was provided an opportunity to ask questions and all were answered.  The patient agreed with the plan and demonstrated an understanding of the instructions.  The patient was advised to call back if the symptoms worsen or if the condition fails to improve as anticipated.   Melissa C. Mike Gip, MD, PhD    12/11/2019, 10:32 AM  I, Mirian Mo Tufford, am acting as Education administrator for [employer]. Mike Gip, MD, PhD.  I, Melissa C. Mike Gip, MD, have reviewed the above documentation for accuracy and completeness, and I agree with the above.

## 2019-12-11 ENCOUNTER — Inpatient Hospital Stay: Payer: Commercial Managed Care - PPO

## 2019-12-11 ENCOUNTER — Other Ambulatory Visit: Payer: Self-pay

## 2019-12-11 ENCOUNTER — Encounter: Payer: Self-pay | Admitting: Hematology and Oncology

## 2019-12-11 ENCOUNTER — Inpatient Hospital Stay: Payer: Commercial Managed Care - PPO | Attending: Hematology and Oncology | Admitting: Hematology and Oncology

## 2019-12-11 VITALS — BP 128/78 | HR 76 | Resp 18 | Ht 74.0 in | Wt 245.9 lb

## 2019-12-11 DIAGNOSIS — F17210 Nicotine dependence, cigarettes, uncomplicated: Secondary | ICD-10-CM | POA: Diagnosis not present

## 2019-12-11 DIAGNOSIS — M549 Dorsalgia, unspecified: Secondary | ICD-10-CM | POA: Diagnosis not present

## 2019-12-11 DIAGNOSIS — E611 Iron deficiency: Secondary | ICD-10-CM | POA: Diagnosis not present

## 2019-12-11 DIAGNOSIS — D696 Thrombocytopenia, unspecified: Secondary | ICD-10-CM

## 2019-12-11 DIAGNOSIS — R109 Unspecified abdominal pain: Secondary | ICD-10-CM | POA: Diagnosis not present

## 2019-12-11 DIAGNOSIS — K760 Fatty (change of) liver, not elsewhere classified: Secondary | ICD-10-CM | POA: Insufficient documentation

## 2019-12-11 DIAGNOSIS — R161 Splenomegaly, not elsewhere classified: Secondary | ICD-10-CM | POA: Diagnosis not present

## 2019-12-11 DIAGNOSIS — K8020 Calculus of gallbladder without cholecystitis without obstruction: Secondary | ICD-10-CM | POA: Insufficient documentation

## 2019-12-11 DIAGNOSIS — Z9049 Acquired absence of other specified parts of digestive tract: Secondary | ICD-10-CM | POA: Diagnosis not present

## 2019-12-11 DIAGNOSIS — D72819 Decreased white blood cell count, unspecified: Secondary | ICD-10-CM

## 2019-12-11 DIAGNOSIS — D509 Iron deficiency anemia, unspecified: Secondary | ICD-10-CM

## 2019-12-11 DIAGNOSIS — Z888 Allergy status to other drugs, medicaments and biological substances status: Secondary | ICD-10-CM | POA: Insufficient documentation

## 2019-12-11 DIAGNOSIS — Z79899 Other long term (current) drug therapy: Secondary | ICD-10-CM | POA: Insufficient documentation

## 2019-12-11 DIAGNOSIS — R17 Unspecified jaundice: Secondary | ICD-10-CM | POA: Diagnosis not present

## 2019-12-11 DIAGNOSIS — R718 Other abnormality of red blood cells: Secondary | ICD-10-CM | POA: Diagnosis not present

## 2019-12-11 DIAGNOSIS — M2550 Pain in unspecified joint: Secondary | ICD-10-CM | POA: Insufficient documentation

## 2019-12-11 DIAGNOSIS — Z8249 Family history of ischemic heart disease and other diseases of the circulatory system: Secondary | ICD-10-CM | POA: Diagnosis not present

## 2019-12-11 DIAGNOSIS — E119 Type 2 diabetes mellitus without complications: Secondary | ICD-10-CM | POA: Insufficient documentation

## 2019-12-11 DIAGNOSIS — Z833 Family history of diabetes mellitus: Secondary | ICD-10-CM | POA: Diagnosis not present

## 2019-12-11 LAB — CBC WITH DIFFERENTIAL/PLATELET
Abs Immature Granulocytes: 0.01 10*3/uL (ref 0.00–0.07)
Basophils Absolute: 0 10*3/uL (ref 0.0–0.1)
Basophils Relative: 1 %
Eosinophils Absolute: 0.1 10*3/uL (ref 0.0–0.5)
Eosinophils Relative: 2 %
HCT: 46.1 % (ref 39.0–52.0)
Hemoglobin: 14.8 g/dL (ref 13.0–17.0)
Immature Granulocytes: 0 %
Lymphocytes Relative: 31 %
Lymphs Abs: 1.3 10*3/uL (ref 0.7–4.0)
MCH: 27.3 pg (ref 26.0–34.0)
MCHC: 32.1 g/dL (ref 30.0–36.0)
MCV: 85.1 fL (ref 80.0–100.0)
Monocytes Absolute: 0.3 10*3/uL (ref 0.1–1.0)
Monocytes Relative: 8 %
Neutro Abs: 2.5 10*3/uL (ref 1.7–7.7)
Neutrophils Relative %: 58 %
Platelets: 77 10*3/uL — ABNORMAL LOW (ref 150–400)
RBC: 5.42 MIL/uL (ref 4.22–5.81)
RDW: 16.3 % — ABNORMAL HIGH (ref 11.5–15.5)
WBC: 4.3 10*3/uL (ref 4.0–10.5)
nRBC: 0 % (ref 0.0–0.2)

## 2019-12-11 LAB — HEPATIC FUNCTION PANEL
ALT: 35 U/L (ref 0–44)
AST: 34 U/L (ref 15–41)
Albumin: 4 g/dL (ref 3.5–5.0)
Alkaline Phosphatase: 59 U/L (ref 38–126)
Bilirubin, Direct: 0.3 mg/dL — ABNORMAL HIGH (ref 0.0–0.2)
Indirect Bilirubin: 1.2 mg/dL — ABNORMAL HIGH (ref 0.3–0.9)
Total Bilirubin: 1.5 mg/dL — ABNORMAL HIGH (ref 0.3–1.2)
Total Protein: 6.7 g/dL (ref 6.5–8.1)

## 2019-12-11 LAB — IMMATURE PLATELET FRACTION: Immature Platelet Fraction: 7.9 % (ref 1.2–8.6)

## 2019-12-11 NOTE — Progress Notes (Signed)
No new changes noted today 

## 2019-12-13 ENCOUNTER — Encounter: Payer: Self-pay | Admitting: Hematology and Oncology

## 2019-12-13 NOTE — Progress Notes (Signed)
Patient called to ask if he needed a driver to drive him home on Monday after his bmb.  I called him back to let him know he would receive moderate sedation during his procedure and this requires someone to drive him home.  Patient verbalized understanding and he is going to make arrangements

## 2019-12-14 ENCOUNTER — Other Ambulatory Visit: Payer: Self-pay | Admitting: Radiology

## 2019-12-14 NOTE — Progress Notes (Signed)
Patient on schedule for BMB 12/17/2019,called and spoke with patient with pre procedure instructions given. Made aware to be here @ 0730, NPO after Mn prior, and driver for home after discharge. To hold am dose of diabetic meds, stated understanding.

## 2019-12-17 ENCOUNTER — Other Ambulatory Visit: Payer: Self-pay

## 2019-12-17 ENCOUNTER — Ambulatory Visit
Admission: RE | Admit: 2019-12-17 | Discharge: 2019-12-17 | Disposition: A | Discharge status: Home / Self Care | Payer: Commercial Managed Care - PPO | Source: Ambulatory Visit | Attending: Hematology and Oncology | Admitting: Hematology and Oncology

## 2019-12-17 DIAGNOSIS — Z87891 Personal history of nicotine dependence: Secondary | ICD-10-CM | POA: Diagnosis not present

## 2019-12-17 DIAGNOSIS — Z7984 Long term (current) use of oral hypoglycemic drugs: Secondary | ICD-10-CM | POA: Insufficient documentation

## 2019-12-17 DIAGNOSIS — Z79899 Other long term (current) drug therapy: Secondary | ICD-10-CM | POA: Diagnosis not present

## 2019-12-17 DIAGNOSIS — D696 Thrombocytopenia, unspecified: Secondary | ICD-10-CM | POA: Diagnosis not present

## 2019-12-17 DIAGNOSIS — D72819 Decreased white blood cell count, unspecified: Secondary | ICD-10-CM | POA: Insufficient documentation

## 2019-12-17 DIAGNOSIS — R161 Splenomegaly, not elsewhere classified: Secondary | ICD-10-CM

## 2019-12-17 DIAGNOSIS — Z888 Allergy status to other drugs, medicaments and biological substances status: Secondary | ICD-10-CM | POA: Insufficient documentation

## 2019-12-17 LAB — CBC WITH DIFFERENTIAL/PLATELET
Abs Immature Granulocytes: 0.02 10*3/uL (ref 0.00–0.07)
Basophils Absolute: 0 10*3/uL (ref 0.0–0.1)
Basophils Relative: 1 %
Eosinophils Absolute: 0.1 10*3/uL (ref 0.0–0.5)
Eosinophils Relative: 2 %
HCT: 44.9 % (ref 39.0–52.0)
Hemoglobin: 14.5 g/dL (ref 13.0–17.0)
Immature Granulocytes: 1 %
Lymphocytes Relative: 31 %
Lymphs Abs: 1.3 10*3/uL (ref 0.7–4.0)
MCH: 27.5 pg (ref 26.0–34.0)
MCHC: 32.3 g/dL (ref 30.0–36.0)
MCV: 85 fL (ref 80.0–100.0)
Monocytes Absolute: 0.4 10*3/uL (ref 0.1–1.0)
Monocytes Relative: 9 %
Neutro Abs: 2.4 10*3/uL (ref 1.7–7.7)
Neutrophils Relative %: 56 %
Platelets: 81 10*3/uL — ABNORMAL LOW (ref 150–400)
RBC: 5.28 MIL/uL (ref 4.22–5.81)
RDW: 16.1 % — ABNORMAL HIGH (ref 11.5–15.5)
WBC: 4.3 10*3/uL (ref 4.0–10.5)
nRBC: 0 % (ref 0.0–0.2)

## 2019-12-17 MED ORDER — MIDAZOLAM HCL 5 MG/5ML IJ SOLN
INTRAMUSCULAR | Status: AC
  Filled 2019-12-17: qty 5

## 2019-12-17 MED ORDER — FENTANYL CITRATE (PF) 100 MCG/2ML IJ SOLN
INTRAMUSCULAR | Status: AC | PRN
Start: 2019-12-17 — End: 2019-12-17
  Administered 2019-12-17 (×2): 50 ug via INTRAVENOUS

## 2019-12-17 MED ORDER — HEPARIN SOD (PORK) LOCK FLUSH 100 UNIT/ML IV SOLN
INTRAVENOUS | Status: AC
  Filled 2019-12-17: qty 5

## 2019-12-17 MED ORDER — MIDAZOLAM HCL 5 MG/5ML IJ SOLN
INTRAMUSCULAR | Status: AC | PRN
  Administered 2019-12-17 (×2): 1 mg via INTRAVENOUS

## 2019-12-17 MED ORDER — SODIUM CHLORIDE 0.9 % IV SOLN
INTRAVENOUS | Status: DC
  Administered 2019-12-17: 1000 mL via INTRAVENOUS

## 2019-12-17 MED ORDER — FENTANYL CITRATE (PF) 100 MCG/2ML IJ SOLN
INTRAMUSCULAR | Status: AC
  Filled 2019-12-17: qty 2

## 2019-12-17 NOTE — Discharge Instructions (Signed)
Bone Marrow Aspiration and Bone Marrow Biopsy, Adult, Care After This sheet gives you information about how to care for yourself after your procedure. If you have problems or questions, contact your health care provider.  What can I expect after the procedure?  After the procedure, it is common to have:  Mild pain and tenderness.  Swelling.  Bruising.  Follow these instructions at home:  Take over-the-counter or prescription medicines only as told by your health care provider.  You may shower tomorrow ? Remove band aid tomorrow, replace with another bandaid if  site has any drainage from biopsy site. ? Wash your hands with soap and water before you touch your biopsy site  If soap and water are not available, use hand sanitizer. ? Change your dressing frequently for bleeding and/or drainage.  Check your puncture site every day for signs of infection. Check for: ? More redness, swelling, or pain. ? More fluid or blood. ? Warmth. ? Pus or a bad smell.  Return to your normal activities in 24hours.   Do not drive for 24 hours if you were given a medicine to help you relax (sedative).  Keep all follow-up visits as told by your health care provider. This is important. Contact a health care provider if:  You have more redness, swelling, or pain around the puncture site.  You have more fluid or blood coming from the puncture site.  Your puncture site feels warm to the touch.  You have pus or a bad smell coming from the puncture site.  You have a fever.  Your pain is not controlled with medicine. This information is not intended to replace advice given to you by your health care provider. Make sure you discuss any questions you have with your health care provider. Document Released: 08/14/2004 Document Revised: 08/15/2015 Document Reviewed: 07/09/2015 Elsevier Interactive Patient Education  2018 [employer].

## 2019-12-17 NOTE — Procedures (Signed)
  Procedure: CT bone marrow biopsy R iliac EBL:   minimal Complications:  none immediate  See full dictation in [facility].  Dillard Cannon MD Main # [phone] Pager  [phone] Mobile [phone]

## 2019-12-17 NOTE — H&P (Signed)
Chief Complaint: Leukopenia and thrombocytopenia. The request is for bone marrow biopsy.   Referring Physician(s): Onaway C  Supervising Physician: Arne Cleveland  Patient Status: [facility] - Out-pt  History of Present Illness: Juan Garza is a 59 y.o. male. History of DM, HTN, iron deficiency,with history of a GI bleed. Found to have leukopenia and  thrombocytopenia while being worked up for a injury to his foot. [place] from 10.29.21 reads fatty infiltration to his liver. Team is requesting a bone marrow biopsy for further determination of a hematologic disorder.    Past Medical History:  Diagnosis Date  . Diabetes mellitus without complication [facility])     Past Surgical History:  Procedure Laterality Date  . ABDOMINAL SURGERY    . APPENDECTOMY    . BACK SURGERY    . HERNIA REPAIR    . KNEE SURGERY Left     Allergies: Imitrex [sumatriptan], Seroquel [quetiapine fumarate], and Adhesive [tape]  Medications: Prior to Admission medications   Medication Sig Start Date End Date Taking? Authorizing Provider  atorvastatin (LIPITOR) 10 MG tablet Take 10 mg by mouth daily.   Yes [provider]  dutasteride (AVODART) 0.5 MG capsule Take by mouth. 12/18/18  Yes [provider]  empagliflozin (JARDIANCE) 10 MG TABS tablet Take 10 mg by mouth daily.   Yes [provider]  finasteride (PROSCAR) 5 MG tablet Take 5 mg by mouth daily.   Yes [provider]  glipiZIDE (GLUCOTROL) 10 MG tablet Take 10 mg by mouth daily before breakfast.    Yes [provider]  lisinopril (ZESTRIL) 10 MG tablet Take 10 mg by mouth daily.   Yes [provider]  omeprazole (PRILOSEC) 40 MG capsule Take 40 mg by mouth daily.   Yes [provider]  pregabalin (LYRICA) 75 MG capsule Take by mouth. 01/18/19  Yes [provider]  propranolol (INDERAL) 20 MG tablet Take 20 mg by mouth 2 (two) times daily.   Yes [provider]   eszopiclone (LUNESTA) 2 MG TABS tablet Take by mouth. 04/26/19   [provider]  naproxen (NAPROSYN) 500 MG tablet Take 1 tablet (500 mg total) by mouth 2 (two) times daily. Patient not taking: Reported on 11/27/2019 12/22/18   Melynda Ripple, MD  Semaglutide,0.25 or 0.5MG/DOS, (OZEMPIC, 0.25 OR 0.5 MG/DOSE,) 2 MG/1.5ML SOPN Inject into the skin. 05/22/19   [provider]  gabapentin (NEURONTIN) 300 MG capsule Take 300 mg by mouth at bedtime.  08/08/19  [provider]  sitaGLIPtin-metformin (JANUMET) 50-1000 MG tablet Take 1 tablet by mouth daily.  08/08/19  [provider]     Family History  Problem Relation Age of Onset  . Atrial fibrillation Mother   . Diabetes Father     Social History   Socioeconomic History  . Marital status: Married    Spouse name: Not on file  . Number of children: Not on file  . Years of education: Not on file  . Highest education level: Not on file  Occupational History  . Not on file  Tobacco Use  . Smoking status: Former Research scientist (life sciences)  . Smokeless tobacco: Never Used  Vaping Use  . Vaping Use: Never used  Substance and Sexual Activity  . Alcohol use: Yes    Comment: social  . Drug use: Never  . Sexual activity: Not on file  Other Topics Concern  . Not on file  Social History Narrative  . Not on file   Social Determinants of Health  Financial Resource Strain:   . Difficulty of Paying Living Expenses: Not on file  Food Insecurity:   . Worried About Charity fundraiser in the Last Year: Not on file  . Ran Out of Food in the Last Year: Not on file  Transportation Needs:   . Lack of Transportation (Medical): Not on file  . Lack of Transportation (Non-Medical): Not on file  Physical Activity:   . Days of Exercise per Week: Not on file  . Minutes of Exercise per Session: Not on file  Stress:   . Feeling of Stress : Not on file  Social Connections:   . Frequency of Communication with Friends and Family: Not  on file  . Frequency of Social Gatherings with Friends and Family: Not on file  . Attends Religious Services: Not on file  . Active Member of Clubs or Organizations: Not on file  . Attends Archivist Meetings: Not on file  . Marital Status: Not on file     Review of Systems: A 12 point ROS discussed and pertinent positives are indicated in the HPI above.  All other systems are negative.  Review of Systems  Constitutional: Negative for fever.  HENT: Negative for congestion.   Respiratory: Negative for cough and shortness of breath.   Cardiovascular: Negative for chest pain.  Gastrointestinal: Negative for abdominal pain.  Neurological: Negative for headaches.  Psychiatric/Behavioral: Negative for behavioral problems and confusion.    Vital Signs: BP 135/79   Pulse 84   Temp 98.1 F (36.7 C)   Resp 20   Ht '6\' 2"'  (1.88 m)   Wt 254 lb (115.2 kg)   SpO2 98%   BMI 32.61 kg/m   Physical Exam Vitals and nursing note reviewed.  Constitutional:      Appearance: He is well-developed.  HENT:     Head: Normocephalic.  Cardiovascular:     Rate and Rhythm: Normal rate and regular rhythm.     Pulses: Normal pulses.     Heart sounds: Normal heart sounds.  Pulmonary:     Effort: Pulmonary effort is normal.     Breath sounds: Normal breath sounds.  Musculoskeletal:        General: Normal range of motion.     Cervical back: Normal range of motion.  Skin:    General: Skin is dry.  Neurological:     Mental Status: He is alert and oriented to person, place, and time.     Imaging: US Abdomen Complete  Result Date: 12/08/2019 CLINICAL DATA:  Thrombocytopenia.  Assess liver and spleen. EXAM: ABDOMEN ULTRASOUND COMPLETE COMPARISON:  None. FINDINGS: Gallbladder: 2.3 cm mobile gallstone.  No wall thickening. Common bile duct: Diameter: Normal caliber, 3 mm Liver: Diffusely increased echotexture compatible with fatty infiltration. No focal hepatic abnormality. Portal vein is  patent on color Doppler imaging with normal direction of blood flow towards the liver. IVC: No abnormality visualized. Pancreas: Visualized portion unremarkable. Spleen: Enlarged with a craniocaudal length of 13.8 cm and a splenic volume of 1045 mL. Right Kidney: Length: 12.6 cm. Echogenicity within normal limits. No mass or hydronephrosis visualized. Left Kidney: Length: 12.8 cm. Echogenicity within normal limits. No mass or hydronephrosis visualized. Abdominal aorta: No aneurysm visualized. Other findings: None. IMPRESSION: Diffusely increased echotexture throughout the liver compatible with fatty infiltration. Splenomegaly. Cholelithiasis.  No evidence of acute cholecystitis. Electronically Signed   By: Rolm Baptise M.D.   On: 12/08/2019 20:52    Labs:  CBC: Recent Labs  11/27/19 1501 12/11/19 0939  WBC 5.0 4.3  HGB 15.3 14.8  HCT 47.5 46.1  PLT 82* 77*    COAGS: Recent Labs    11/27/19 1501  INR 1.1  APTT 31    BMP: Recent Labs    11/27/19 1501  NA 137  K 4.0  CL 104  CO2 26  GLUCOSE 134*  BUN 14  CALCIUM 9.0  CREATININE 0.71  GFRNONAA >60    LIVER FUNCTION TESTS: Recent Labs    11/27/19 1501 12/11/19 0939  BILITOT 1.8* 1.5*  AST 45* 34  ALT 45* 35  ALKPHOS 64 59  PROT 7.5 6.7  ALBUMIN 4.3 4.0     Assessment and Plan:  59 y.o. male outpatient. History of DM, HTN, iron deficiency with history of a GI bleed. Found to have leukopenia and  thrombocytopenia while being worked up for a injury to his foot. [place] from 10.29.21 reads fatty infiltration to his liver. Team is requesting a bone marrow biopsy for further determination of a hematologic disorder.   All labs and medications are within acceptable parameters. Allergies include tape. Patient has been NPO since midnight.   Risks and benefits of bone marrow was discussed with the patient and/or patient's family including, but not limited to bleeding, infection, damage to adjacent structures or low yield  requiring additional tests.  All of the questions were answered and there is agreement to proceed.  Consent signed and in chart.  Thank you for this interesting consult.  I greatly enjoyed meeting Juan Garza and look forward to participating in their care.  A copy of this report was sent to the requesting provider on this date.  Electronically Signed: Jacqualine Mau, NP 12/17/2019, 8:17 AM   I spent a total of  30 Minutes   in face to face in clinical consultation, greater than 50% of which was counseling/coordinating care for bone marrow biopsy

## 2019-12-24 ENCOUNTER — Encounter (HOSPITAL_COMMUNITY): Payer: Self-pay | Admitting: Hematology and Oncology

## 2019-12-25 LAB — SURGICAL PATHOLOGY

## 2019-12-26 NOTE — Progress Notes (Signed)
[facility]  [address], [address] [place], [place] [address] Phone: [phone]  Fax: [phone]   Clinic Day:  12/27/2019  Referring physician: Ezequiel Kayser, MD  Chief Complaint: Juan Garza is a 59 y.o. male with leukopenia and thrombocytopenia who is seen for review of interval bone marrow and discussion regarding direction of therapy.  HPI: The patient was last seen in the hematology clinic on 12/11/2019. At that time, he felt "ok". Hematocrit was 46.1, hemoglobin 14.8, platelets 77,000, WBC 4,300 (ANC 2400). Immature platelet fraction was 7.9%. Total bilirubin was 1.5 (direct 0.3).  We discussed bone marrow biopsy.  Bone marrow on 12/17/2019 revealed a hypercellular marrow with dysmegakaryocytopoiesis. Blasts were not increased.  Storage iron was absent.  The features were suggestive of a low-grade myelodysplastic syndrome. Cytogenetics were normal (46, XY).  He was seen by Stephens November, NP in the GI [facility] on 12/19/2019.  He is scheduled for an EGD and colonoscopy on 01/22/2020. Abdomen and pelvis CT was scheduled.  During the interim, he has been fine. He has been eating well. He still has nosebleeds every week, joint pain, heartburn, and abdominal pain. His nausea has worsened over the past few days. He is not taking oral iron. He denies melena.  He has had no problems at the bone marrow site.   Past Medical History:  Diagnosis Date  . Diabetes mellitus without complication [facility])     Past Surgical History:  Procedure Laterality Date  . ABDOMINAL SURGERY    . APPENDECTOMY    . BACK SURGERY    . HERNIA REPAIR    . KNEE SURGERY Left     Family History  Problem Relation Age of Onset  . Atrial fibrillation Mother   . Diabetes Father     Social History:  reports that he has quit smoking. He has never used smokeless tobacco. He reports current alcohol use. He reports that he does not use drugs. He smoked 1.5 packs per day for  ~20 years and quit in 1996. He denies drug use. He drinks 3 drinks weekly, usually rum and coke. He worked at a [employer] in his early 53s. He lives in [place]. 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 The patient is alone today.  Allergies:  Allergies  Allergen Reactions  . Imitrex [Sumatriptan] Other (See Comments)    shaking  . Seroquel [Quetiapine Fumarate] Other (See Comments)    Leg spasms  . Adhesive [Tape] Rash    Current Medications: Current Outpatient Medications  Medication Sig Dispense Refill  . atorvastatin (LIPITOR) 10 MG tablet Take 10 mg by mouth daily.    Marland Kitchen dutasteride (AVODART) 0.5 MG capsule Take by mouth.    . empagliflozin (JARDIANCE) 10 MG TABS tablet Take 10 mg by mouth daily.    . eszopiclone (LUNESTA) 2 MG TABS tablet Take by mouth.    . finasteride (PROSCAR) 5 MG tablet Take 5 mg by mouth daily.    Marland Kitchen glipiZIDE (GLUCOTROL) 10 MG tablet Take 10 mg by mouth daily before breakfast.     . lisinopril (ZESTRIL) 10 MG tablet Take 10 mg by mouth daily.    Marland Kitchen omeprazole (PRILOSEC) 40 MG capsule Take 40 mg by mouth 2 (two) times daily.     . pregabalin (LYRICA) 75 MG capsule Take by mouth.    . propranolol (INDERAL) 20 MG tablet Take 20 mg by mouth 2 (two) times daily.    . Semaglutide,0.25 or 0.5MG/DOS, (OZEMPIC, 0.25 OR  0.5 MG/DOSE,) 2 MG/1.5ML SOPN Inject into the skin.    . naproxen (NAPROSYN) 500 MG tablet Take 1 tablet (500 mg total) by mouth 2 (two) times daily. (Patient not taking: Reported on 11/27/2019) 20 tablet 0   No current facility-administered medications for this visit.    Review of Systems  Constitutional: Negative for chills, diaphoresis, fever, malaise/fatigue and weight loss (up 15 lbs).  HENT: Positive for nosebleeds (once a week). Negative for congestion, ear discharge, ear pain, hearing loss, sinus pain, sore throat and tinnitus.   Eyes: Negative for blurred vision and double vision.  Respiratory: Negative for cough, hemoptysis, sputum  production and shortness of breath.   Cardiovascular: Negative for chest pain, palpitations and leg swelling.  Gastrointestinal: Positive for abdominal pain (comes and goes), heartburn (reflux) and nausea (worsening). Negative for blood in stool, constipation, diarrhea, melena and vomiting.       Eating well.  Genitourinary: Negative for dysuria, frequency, hematuria and urgency.  Musculoskeletal: Positive for back pain and joint pain (hips, left 2nd finger). Negative for falls, myalgias and neck pain.  Skin: Negative for itching and rash.  Neurological: Negative for dizziness, tingling, sensory change, weakness and headaches.  Endo/Heme/Allergies: Bruises/bleeds easily.  Psychiatric/Behavioral: Negative for depression and memory loss. The patient is not nervous/anxious and does not have insomnia.   All other systems reviewed and are negative.  Performance status (ECOG): 1  Vitals Blood pressure 121/76, pulse 79, temperature (!) 97.1 F (36.2 C), temperature source Tympanic, weight 260 lb 9.3 oz (118.2 kg), SpO2 100 %.   Physical Exam Vitals and nursing note reviewed.  Constitutional:      General: He is not in acute distress.    Appearance: He is not diaphoretic.  HENT:     Head:     Comments: Short gray hair. Eyes:     General: No scleral icterus.    Conjunctiva/sclera: Conjunctivae normal.     Comments: Blue eyes.  Neurological:     Mental Status: He is alert and oriented to person, place, and time.  Psychiatric:        Behavior: Behavior normal.        Thought Content: Thought content normal.        Judgment: Judgment normal.    No visits with results within 3 Day(s) from this visit.  Latest known visit with results is:  Hospital Outpatient Visit on 12/17/2019  Component Date Value Ref Range Status  . WBC 12/17/2019 4.3  4.0 - 10.5 K/uL Final  . RBC 12/17/2019 5.28  4.22 - 5.81 MIL/uL Final  . Hemoglobin 12/17/2019 14.5  13.0 - 17.0 g/dL Final  . HCT 12/17/2019 44.9   39 - 52 % Final  . MCV 12/17/2019 85.0  80.0 - 100.0 fL Final  . MCH 12/17/2019 27.5  26.0 - 34.0 pg Final  . MCHC 12/17/2019 32.3  30.0 - 36.0 g/dL Final  . RDW 12/17/2019 16.1* 11.5 - 15.5 % Final  . Platelets 12/17/2019 81* 150 - 400 K/uL Final   Comment: Immature Platelet Fraction may be clinically indicated, consider ordering this additional test [id number]   . nRBC 12/17/2019 0.0  0.0 - 0.2 % Final  . Neutrophils Relative % 12/17/2019 56  % Final  . Neutro Abs 12/17/2019 2.4  1.7 - 7.7 K/uL Final  . Lymphocytes Relative 12/17/2019 31  % Final  . Lymphs Abs 12/17/2019 1.3  0.7 - 4.0 K/uL Final  . Monocytes Relative 12/17/2019 9  % Final  . Monocytes  Absolute 12/17/2019 0.4  0.1 - 1.0 K/uL Final  . Eosinophils Relative 12/17/2019 2  % Final  . Eosinophils Absolute 12/17/2019 0.1  0.0 - 0.5 K/uL Final  . Basophils Relative 12/17/2019 1  % Final  . Basophils Absolute 12/17/2019 0.0  0.0 - 0.1 K/uL Final  . Immature Granulocytes 12/17/2019 1  % Final  . Abs Immature Granulocytes 12/17/2019 0.02  0.00 - 0.07 K/uL Final   Performed at [facility], [address] [place], [place] [address]  . SURGICAL PATHOLOGY 12/17/2019    Final-Edited                   Value:Surgical Pathology THIS IS AN ADDENDUM REPORT CASE: [id number] PATIENT: Juan Garza Bone Marrow Report Addendum  Reason for Addendum #1:  Cytogenetics results  Clinical History: leukopenia, thrombocytopenia  DIAGNOSIS:  BONE MARROW, ASPIRATE, CLOT, CORE: -  Hypercellular marrow with dysmegakaryocytopoiesis -  See comment  PERIPHERAL BLOOD: -  Thrombocytopenia -  See complete blood cell count  COMMENT:  The features are suggestive of a low-grade myelodysplastic syndrome. Non-clonal causes of dysplasia must be excluded before the diagnosis of myelodysplasia is established. These include, but are not limited to, drug and toxin exposure, growth factor therapy, viral infections, immunologic  disorders, and nutritional deficiencies (e.g., B12, copper, etc.). Correlation with cytogenetics is recommended. Additional testing including FISH and/or NGS may be of interest.  MICROSCOPIC DESCRIPTION:  PERIPHERAL BLOOD SMEAR: The pe                         ripheral blood has thrombocytopenia. Neutrophils are occasionally f.  There is mild anisocytosis noted in the red cells.  Otherwise, the peripheral blood is unremarkable.  BONE MARROW ASPIRATE: Spicular, cellular and adequate for evaluation Erythroid precursors: Orderly maturation without overt dysplasia Granulocytic precursors: Orderly maturation without overt dysplasia Megakaryocytes: Increased, frequently hypolobated Lymphocytes/plasma cells: No lymphocytosis or plasmacytosis  TOUCH PREPARATIONS: No additional findings as compared to the aspirate smears  CLOT AND BIOPSY: Examination of the bone marrow core biopsy and clot section reveals a hypercellular marrow for age (90%).there is a slight erythroid predominance, but maturing myeloid elements are present (E-cadherin, MPO).  Blasts are not increased which is reported by CD34. Megakaryocytes are frequently hyperchromatic, hypolobated or with widely separated nuclear lobes.  Loose clustering of megakaryocytes is see                         n. There are no atypical lymphoid aggregates or granulomas identified. There is a streaming quality to the marrow suggestive of fibrosis (reticulin stain pending).  IRON STAIN: Iron stains are performed on a bone marrow aspirate or touch imprint smear and section of clot. The controls stained appropriately.       Storage Iron: Absent      Ring Sideroblasts: Not identified  ADDITIONAL DATA/TESTING: Cytogenetics is pending and will be reported separately.  CELL COUNT DATA:  Bone Marrow count performed on 500 cells shows: Blasts:   1%   Myeloid:  47% Promyelocytes: 2%   Erythroid:     41% Myelocytes:    6%   Lymphocytes:    7% Metamyelocytes:     2%   Plasma cells:  1% Bands:    5% Neutrophils:   28%  M:E ratio:     1.15 Eosinophils:   3% Basophils:     0% Monocytes:     4%  Lab Data:  CBC performed on 12/17/19 shows: WBC: 4.3 k/uL  Neutrophils:   61% Hgb: 14.5 g/dL Lymphocytes:   33% HCT: 44.9 %    Monocytes:     4% MCV: 85 fL     Eosinophils:   2% RDW: 16.1                          %    Basophils:     0% PLT: 81 k/uL  GROSS DESCRIPTION:  A.  Bone marrow aspirate smear.  B.  Received in B-plus fixative is a 0.6 x 0.6 x 0.1 cm aggregate of tan-brown soft tissue, submitted in 1 cassette.  C.  Received in B-plus fixative are 3 cores of tan-brown hard tissue ranging from 1 to 1.5 cm in length by 0.2 cm in diameter, submitted in 1 cassette following decalcification in Immunocal.  (AK 12/17/2019)   Final Diagnosis performed by Thressa Sheller, MD.   Electronically signed 12/20/2019 Technical component performed at [facility], [place] [address] [place], [place] [address].  Professional component performed at [facility]. [facility], [place] [address], [place], [place] [address].  Immunohistochemistry Technical component (if applicable) was performed at [facility]. [address] [place], [place], [place] [address].   IMMUNOHISTOCHEMISTRY DISCLAIMER (if applicable): Some of these immunohistochemical stains may                          have been developed and the performance characteristics determine by [facility]. Some may not have been cleared or approved by the U.S. Food and Drug Administration. The FDA has determined that such clearance or approval is not necessary. This test is used for clinical purposes. It should not be regarded as investigational or for research. This laboratory is certified under the [place] (CLIA-88) as qualified to perform high complexity clinical laboratory testing.  The  controls stained appropriately.  ADDENDUM:  Please note this testing was performed and interpreted by an outside facility ([facility]).  This addendum is only being added to provide a summary of the results for report completeness.  Please see electronic medical record for a copy of the full report.   Karyotype: 46,XY[20] Interpretation: NORMAL MALE KARYOTYPE  Cytogenetic analysis shows a normal male karyotype in all cells analyzed.                       Addendum #1 performed by Thressa Sheller, MD.   Electronically signed 12/25/2019 Technical component performed at [facility], [place] [address], [place], [place] [address].  Professional component performed at [facility]. [facility], [place] [address] [place], [place] [address].  Immunohistochemistry Technical component (if applicable) was performed at [facility]. [address], [place], [place], [place] [address].   IMMUNOHISTOCHEMISTRY DISCLAIMER (if applicable): Some of these immunohistochemical stains may have been developed and the performance characteristics determine by [facility]. Some may not have been cleared or approved by the U.S. Food and Drug Administration. The FDA has determined that such clearance or approval is not necessary. This test is used for clinical purposes. It should not be regarded as investigational or for research. This laboratory is certified under the [place] of  1988 (CLIA-88) as qualified to perform high complexity clinical laboratory testing.  The controls stained appropriately.    Assessment:  Juan Garza is a 59 y.o. 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  He presented with leukopenia and thrombocytopenia.  Bone marrow on 12/17/2019 revealed a hypercellular marrow with dysmegakaryocytopoiesis. The features were suggestive of a low-grade myelodysplastic syndrome.  Cytogenetics were normal (46, XY).  IPSS score 0 (low) and IPSS-R (0.5).  CBC on 11/14/2019 revealed a hematocrit 43.3, hemoglobin 14.2, MCV 85.9, platelets 77,000, WBC 3,600 (ANC 2,000).  SPEP was normal on 10/17/2019. Vitamin B12 was 838 on 10/17/2019.  TSH and free T4 were normal on 04/16/2019.  Work-up on 11/27/2019 revealed a hematocrit of 47.5, hemoglobin 15.3, platelets 82,000, WBC 5,000. Platelet count in a citrate was 81,400.  AST was 45, ALT 45, and bilirubin 1.8. Normal labs included PT (31), PT (14.1), LDH (176),  copper (78), TSH (1.579), free T4 (0.70), folate (6.8), ANA, H pylori antibody, HIV antibody, hepatitis B core antibody, and hepatitis C antibody were negative.  Immature platelet fraction was 7.9% (normal) on 12/11/2019.   Peripheral smear revealed mild thrombocytopenia without clumping, which has been persistent. RBCs were normochromic, with mild anisocytosis. There was no poikilocytosis or fragmentation.  There were no abnormal or immature leukocytes were identified.  Abdominal ultrasound on 12/07/2019 revealed diffusely increased echotexture throughout the liver c/w fatty infiltration. Portal vein was patent on Doppler with normal direction of blood flow toward the liver.  There was splenomegaly (13.8 cm). There was cholelithiasis. There was no evidence of acute cholecystitis.  He has iron deficiency.  He has a history of upper GI bleed felt related to ibuprofen in 06/2019. Ferritin was 9 on 05/28/2019 and 19 on 10/17/2019. Iron saturation was 6% and TIBC was 467 on 05/28/2019. Reticulocyte count was 1.34% on 10/17/2019. Sed rate was 2 and CRP was 1 on 11/14/2019.  At home hemoccult test was positive on 10/25/2019.  The patient received the [place] COVID-19 vaccine on 05/02/2019 and 05/23/2019.  Symptomatically,  he has felt "fine".  He has nosebleeds every week, joint pain, heartburn, and abdominal pain. His nausea has worsened over the past few days. He is not taking oral  iron. He denies melena.  Exam reveals no adenopathy or hepatosplenomegaly.  Plan: 1.   Low grade myelodysplastic syndrome  Patient presented with leukopenia and thrombocytopenia.  Review bone marrow from 12/17/2019.     Features are suggestive of a low grade myelodysplastic syndrome.   Await FISH studies.   IPSS sore is low.  Hematocrit 44.9.  Hemoglobin 14.5.  MCV 85.0.  Platelets 81,000.  WBC 4300 with an ANC of 2400.  Currently, patient only has mild thrombocytopenia.  Spleen is mildly enlarged at 13.8 cm.  Discuss myelodysplastic syndromes.  Information provided.  Discuss treatment with Vidaza, a hypomethylating agent, in the future, based on stability of counts. 2.   Iron deficiency  Guaiac card was + on 10/25/2019. He notes a history of abdominal discomfort, appetite loss, and a 40 pound weight loss.  He is scheduled for EGD and colonoscopy on 01/22/2020. Abdomen and pelvis CT is planned. 3.   Tumor board on 02/05/2020. 4.   RTC in 1 month for labs (CBC with diff). 5.   RTC in 2 months for MD assessment, labs (CBC with diff, CMP), and review of interval CT and endoscopies.  I discussed the assessment and treatment plan with the patient.  The patient was provided an opportunity to ask questions  and all were answered.  The patient agreed with the plan and demonstrated an understanding of the instructions.  The patient was advised to call back if the symptoms worsen or if the condition fails to improve as anticipated.   Ariam Mol C. Mike Gip, MD, PhD    12/27/2019, 10:01 AM  I, Mirian Mo Tufford, am acting as Education administrator for [employer]. Mike Gip, MD, PhD.  I, Min Tunnell C. Mike Gip, MD, have reviewed the above documentation for accuracy and completeness, and I agree with the above.

## 2019-12-27 ENCOUNTER — Other Ambulatory Visit: Payer: Self-pay

## 2019-12-27 ENCOUNTER — Encounter: Payer: Self-pay | Admitting: Hematology and Oncology

## 2019-12-27 ENCOUNTER — Inpatient Hospital Stay: Payer: Commercial Managed Care - PPO | Attending: Hematology and Oncology | Admitting: Hematology and Oncology

## 2019-12-27 VITALS — BP 121/76 | HR 79 | Temp 97.1°F | Wt 260.6 lb

## 2019-12-27 DIAGNOSIS — Z87891 Personal history of nicotine dependence: Secondary | ICD-10-CM | POA: Diagnosis not present

## 2019-12-27 DIAGNOSIS — D696 Thrombocytopenia, unspecified: Secondary | ICD-10-CM | POA: Diagnosis not present

## 2019-12-27 DIAGNOSIS — Z7984 Long term (current) use of oral hypoglycemic drugs: Secondary | ICD-10-CM | POA: Insufficient documentation

## 2019-12-27 DIAGNOSIS — E119 Type 2 diabetes mellitus without complications: Secondary | ICD-10-CM | POA: Diagnosis not present

## 2019-12-27 DIAGNOSIS — D469 Myelodysplastic syndrome, unspecified: Secondary | ICD-10-CM | POA: Diagnosis present

## 2019-12-27 DIAGNOSIS — D509 Iron deficiency anemia, unspecified: Secondary | ICD-10-CM | POA: Diagnosis not present

## 2019-12-27 DIAGNOSIS — Z79899 Other long term (current) drug therapy: Secondary | ICD-10-CM | POA: Insufficient documentation

## 2019-12-27 DIAGNOSIS — R161 Splenomegaly, not elsewhere classified: Secondary | ICD-10-CM | POA: Diagnosis not present

## 2019-12-27 DIAGNOSIS — E611 Iron deficiency: Secondary | ICD-10-CM | POA: Diagnosis not present

## 2019-12-27 NOTE — Patient Instructions (Signed)
Myelodysplastic Syndrome Myelodysplastic syndrome (MDS) is a group of cancers that affect blood cells. New blood cells are also called immature cells or stem cells. MDS starts in the bone marrow where new blood cells are made. The bone marrow makes:  Red blood cells. These carry oxygen.  White blood cells. These fight infection.  Platelets. These stop bleeding. With MDS, some immature cells do not grow into adult blood cells. These cells will die before they reach maturity. Immature blood cells build up in the bone marrow and crowd out normal adult blood cells. This causes a low blood count and can affect how many healthy red blood cells, white blood cells, and platelets you have. If you have too few red blood cells, your blood may not have enough oxygen, and you will feel tired. If you do not have enough white blood cells, you might get infections often. If you have too few platelets, you may bruise or bleed easily. What are the causes? This condition may be caused by:  Being exposed to certain medicines or chemicals.  Radiation treatment. In some cases, the cause may not be known. What increases the risk? This condition is more likely to develop in:  Caucasians.  Males.  People who are 37 years of age or older.  People who have been treated with cancer medicines or radiation.  People who have been exposed to tobacco smoke, pesticides, lead, mercury, or the benzene chemical.  People with a family history of MDS.  People who inherit certain abnormal genes that lead to bone marrow problems. What are the signs or symptoms? Symptoms often develop slowly over time. Symptoms depend on which blood cells are affected. Symptoms include:  Being tired all the time.  Feeling out of breath.  Having pale skin.  Getting infections often.  Having pain in the bones.  Feeling weak.  Having a fever often.  Bruising easily.  Bleeding that lasts longer than normal.  Having tiny  spots of bleeding under the skin (petechiae). How is this diagnosed? This condition is diagnosed based on your symptoms, medical history, and physical exam. You may also have tests, including:  A complete blood count. This test counts the number of red, white, and platelet blood cells.  Peripheral blood smear. This test checks the number and type of blood cells. It also checks their size and shape.  Bone marrow aspiration and biopsy. Aspiration involves using a needle to remove the liquid portion of the bone marrow, and a biopsy involves removing the bone marrow tissue. The samples removed will be examined under a microscope. How is this treated? There is no cure for MDS. Treatment focuses on relieving your symptoms and slowing down the production of immature blood cells. The type of treatment that is best for you depends on the type of MDS you have. Treatment may include:  Blood transfusions.  Chemotherapy. This is the use of medicines to kill cancer cells.  Targeted therapy. This treatment targets specific parts of cancer cells and the area around them to block the growth and spread of the cancer.  Immunotherapy or biologic therapy. This treatment helps your body boost its own immune system and natural defenses to stop the growth and spread of cancer cells.  Medicines that: ? Slow down the number of immature blood cells that are made. ? Help immature cells develop into adult cells. ? Make more blood cells. ? Treat infections (antibiotics).  Bone marrow stem cell transplant. In this procedure, your stem cells  will be replaced with healthy stem cells from a donor. Follow these instructions at home: Medicines  Take over-the-counter and prescription medicines only as told by your health care provider.  Do not take aspirin unless your health care provider approves. Aspirin can make bleeding more likely.  If you were prescribed an antibiotic, take it as told by your health care  provider. Do not stop taking the antibiotic even if you start to feel better. Lifestyle   Avoid dangerous activities that could lead to bleeding. Ask your health care provider what activities are safe for you.  Wash all fruits and vegetables before eating them. Make sure meat and eggs are well cooked. Do not eat raw fish and shellfish.  Wash your hands often with soap and water. If soap and water are not available, use hand sanitizer.  Do not use any tobacco products, such as cigarettes, chewing tobacco, and e-cigarettes. If you need help quitting, ask your health care provider. General instructions  Stay away from people who are sick.  Keep all follow-up visits as told by your health care provider. This is important.  Talk to your health care provider before having any medical or dental procedure. Contact a health care provider if:  You feel more tired than normal.  You have more bruising than usual.  You have trouble catching your breath.  You have any symptoms of infection, such as: ? Fever. ? Chills. ? Abnormal urinary symptoms. ? Cough. ? Body aches. ? Headache. ? Weakness. ? Dizziness. Get help right away if:  Your symptoms get worse.  You have bleeding that does not stop.  You have trouble breathing.  You have chest pain. This information is not intended to replace advice given to you by your health care provider. Make sure you discuss any questions you have with your health care provider. Document Revised: 05/19/2018 Document Reviewed: 02/09/2015 Elsevier Patient Education  2020 [employer].

## 2019-12-28 ENCOUNTER — Other Ambulatory Visit (HOSPITAL_COMMUNITY): Payer: Self-pay | Admitting: Gastroenterology

## 2019-12-28 ENCOUNTER — Other Ambulatory Visit: Payer: Self-pay | Admitting: Gastroenterology

## 2019-12-28 DIAGNOSIS — R1033 Periumbilical pain: Secondary | ICD-10-CM

## 2019-12-28 DIAGNOSIS — K921 Melena: Secondary | ICD-10-CM

## 2020-01-02 ENCOUNTER — Other Ambulatory Visit: Payer: Self-pay

## 2020-01-02 ENCOUNTER — Other Ambulatory Visit
Admission: RE | Admit: 2020-01-02 | Discharge: 2020-01-02 | Disposition: A | Discharge status: Home / Self Care | Payer: Commercial Managed Care - PPO | Source: Home / Self Care | Attending: Gastroenterology | Admitting: Gastroenterology

## 2020-01-02 ENCOUNTER — Ambulatory Visit
Admission: RE | Admit: 2020-01-02 | Discharge: 2020-01-02 | Disposition: A | Discharge status: Home / Self Care | Payer: Commercial Managed Care - PPO | Source: Ambulatory Visit | Attending: Gastroenterology | Admitting: Gastroenterology

## 2020-01-02 DIAGNOSIS — K921 Melena: Secondary | ICD-10-CM | POA: Insufficient documentation

## 2020-01-02 DIAGNOSIS — R1033 Periumbilical pain: Secondary | ICD-10-CM | POA: Insufficient documentation

## 2020-01-02 DIAGNOSIS — R198 Other specified symptoms and signs involving the digestive system and abdomen: Secondary | ICD-10-CM | POA: Insufficient documentation

## 2020-01-02 DIAGNOSIS — R634 Abnormal weight loss: Secondary | ICD-10-CM | POA: Insufficient documentation

## 2020-01-02 DIAGNOSIS — R748 Abnormal levels of other serum enzymes: Secondary | ICD-10-CM | POA: Insufficient documentation

## 2020-01-02 LAB — CREATININE, SERUM
Creatinine, Ser: 0.83 mg/dL (ref 0.61–1.24)
GFR, Estimated: >60 mL/min (ref >60)

## 2020-01-02 MED ORDER — IOHEXOL 300 MG/ML  SOLN
100.0000 mL | Freq: Once | INTRAMUSCULAR | Status: AC | PRN
  Administered 2020-01-02: 100 mL via INTRAVENOUS

## 2020-01-18 ENCOUNTER — Other Ambulatory Visit: Payer: Self-pay

## 2020-01-18 ENCOUNTER — Other Ambulatory Visit
Admission: RE | Admit: 2020-01-18 | Discharge: 2020-01-18 | Disposition: A | Discharge status: Home / Self Care | Payer: Commercial Managed Care - PPO | Source: Ambulatory Visit | Attending: Gastroenterology | Admitting: Gastroenterology

## 2020-01-18 DIAGNOSIS — Z01812 Encounter for preprocedural laboratory examination: Secondary | ICD-10-CM | POA: Insufficient documentation

## 2020-01-18 DIAGNOSIS — Z20822 Contact with and (suspected) exposure to covid-19: Secondary | ICD-10-CM | POA: Diagnosis not present

## 2020-01-19 LAB — SARS CORONAVIRUS 2 (TAT 6-24 HRS): SARS Coronavirus 2: NEGATIVE

## 2020-01-21 ENCOUNTER — Encounter: Payer: Self-pay | Admitting: *Deleted

## 2020-01-22 ENCOUNTER — Ambulatory Visit: Payer: Commercial Managed Care - PPO | Admitting: Certified Registered Nurse Anesthetist

## 2020-01-22 ENCOUNTER — Other Ambulatory Visit: Payer: Self-pay

## 2020-01-22 ENCOUNTER — Encounter
Admission: RE | Disposition: A | Discharge status: Home / Self Care | Payer: Self-pay | Source: Home / Self Care | Attending: Gastroenterology

## 2020-01-22 ENCOUNTER — Encounter: Payer: Self-pay | Admitting: *Deleted

## 2020-01-22 ENCOUNTER — Emergency Department
Admission: EM | Admit: 2020-01-22 | Discharge: 2020-01-23 | Disposition: A | Discharge status: Home / Self Care | Payer: Commercial Managed Care - PPO | Source: Home / Self Care | Attending: Emergency Medicine | Admitting: Emergency Medicine

## 2020-01-22 ENCOUNTER — Encounter: Payer: Self-pay | Admitting: Emergency Medicine

## 2020-01-22 ENCOUNTER — Ambulatory Visit
Admission: RE | Admit: 2020-01-22 | Discharge: 2020-01-22 | Disposition: A | Discharge status: Home / Self Care | Payer: Commercial Managed Care - PPO | Attending: Gastroenterology | Admitting: Gastroenterology

## 2020-01-22 DIAGNOSIS — Z8719 Personal history of other diseases of the digestive system: Secondary | ICD-10-CM | POA: Insufficient documentation

## 2020-01-22 DIAGNOSIS — R112 Nausea with vomiting, unspecified: Secondary | ICD-10-CM | POA: Insufficient documentation

## 2020-01-22 DIAGNOSIS — K219 Gastro-esophageal reflux disease without esophagitis: Secondary | ICD-10-CM | POA: Insufficient documentation

## 2020-01-22 DIAGNOSIS — K91840 Postprocedural hemorrhage and hematoma of a digestive system organ or structure following a digestive system procedure: Secondary | ICD-10-CM | POA: Diagnosis not present

## 2020-01-22 DIAGNOSIS — R1033 Periumbilical pain: Secondary | ICD-10-CM | POA: Diagnosis not present

## 2020-01-22 DIAGNOSIS — I8500 Esophageal varices without bleeding: Secondary | ICD-10-CM | POA: Diagnosis not present

## 2020-01-22 DIAGNOSIS — D124 Benign neoplasm of descending colon: Secondary | ICD-10-CM | POA: Diagnosis not present

## 2020-01-22 DIAGNOSIS — Z981 Arthrodesis status: Secondary | ICD-10-CM | POA: Insufficient documentation

## 2020-01-22 DIAGNOSIS — Z791 Long term (current) use of non-steroidal anti-inflammatories (NSAID): Secondary | ICD-10-CM | POA: Diagnosis not present

## 2020-01-22 DIAGNOSIS — K625 Hemorrhage of anus and rectum: Secondary | ICD-10-CM

## 2020-01-22 DIAGNOSIS — K2960 Other gastritis without bleeding: Secondary | ICD-10-CM | POA: Insufficient documentation

## 2020-01-22 DIAGNOSIS — D123 Benign neoplasm of transverse colon: Secondary | ICD-10-CM | POA: Diagnosis not present

## 2020-01-22 DIAGNOSIS — K766 Portal hypertension: Secondary | ICD-10-CM | POA: Insufficient documentation

## 2020-01-22 DIAGNOSIS — Z79899 Other long term (current) drug therapy: Secondary | ICD-10-CM | POA: Diagnosis not present

## 2020-01-22 DIAGNOSIS — Z96652 Presence of left artificial knee joint: Secondary | ICD-10-CM | POA: Insufficient documentation

## 2020-01-22 DIAGNOSIS — R634 Abnormal weight loss: Secondary | ICD-10-CM | POA: Insufficient documentation

## 2020-01-22 DIAGNOSIS — Z7984 Long term (current) use of oral hypoglycemic drugs: Secondary | ICD-10-CM | POA: Insufficient documentation

## 2020-01-22 DIAGNOSIS — Y839 Surgical procedure, unspecified as the cause of abnormal reaction of the patient, or of later complication, without mention of misadventure at the time of the procedure: Secondary | ICD-10-CM | POA: Insufficient documentation

## 2020-01-22 DIAGNOSIS — K3189 Other diseases of stomach and duodenum: Secondary | ICD-10-CM | POA: Diagnosis not present

## 2020-01-22 DIAGNOSIS — D509 Iron deficiency anemia, unspecified: Secondary | ICD-10-CM | POA: Diagnosis present

## 2020-01-22 DIAGNOSIS — E1140 Type 2 diabetes mellitus with diabetic neuropathy, unspecified: Secondary | ICD-10-CM | POA: Insufficient documentation

## 2020-01-22 DIAGNOSIS — Z87891 Personal history of nicotine dependence: Secondary | ICD-10-CM | POA: Insufficient documentation

## 2020-01-22 DIAGNOSIS — Z952 Presence of prosthetic heart valve: Secondary | ICD-10-CM | POA: Insufficient documentation

## 2020-01-22 DIAGNOSIS — Z86010 Personal history of colonic polyps: Secondary | ICD-10-CM | POA: Insufficient documentation

## 2020-01-22 HISTORY — PX: COLONOSCOPY WITH PROPOFOL: SHX5780

## 2020-01-22 HISTORY — PX: ESOPHAGOGASTRODUODENOSCOPY (EGD) WITH PROPOFOL: SHX5813

## 2020-01-22 HISTORY — DX: Other chronic pain: G89.29

## 2020-01-22 LAB — COMPREHENSIVE METABOLIC PANEL
ALT: 42 U/L (ref 0–44)
AST: 50 U/L — ABNORMAL HIGH (ref 15–41)
Albumin: 4.2 g/dL (ref 3.5–5.0)
Alkaline Phosphatase: 60 U/L (ref 38–126)
Anion gap: 9 (ref 5–15)
BUN: 8 mg/dL (ref 6–20)
CO2: 27 mmol/L (ref 22–32)
Calcium: 9.2 mg/dL (ref 8.9–10.3)
Chloride: 104 mmol/L (ref 98–111)
Creatinine, Ser: 0.73 mg/dL (ref 0.61–1.24)
GFR, Estimated: >60 mL/min (ref >60)
Glucose, Bld: 152 mg/dL — ABNORMAL HIGH (ref 70–99)
Potassium: 3.8 mmol/L (ref 3.5–5.1)
Sodium: 140 mmol/L (ref 135–145)
Total Bilirubin: 2 mg/dL — ABNORMAL HIGH (ref 0.3–1.2)
Total Protein: 7 g/dL (ref 6.5–8.1)

## 2020-01-22 LAB — CBC
HCT: 43.7 % (ref 39.0–52.0)
Hemoglobin: 14.4 g/dL (ref 13.0–17.0)
MCH: 27.4 pg (ref 26.0–34.0)
MCHC: 33 g/dL (ref 30.0–36.0)
MCV: 83.2 fL (ref 80.0–100.0)
Platelets: 73 10*3/uL — ABNORMAL LOW (ref 150–400)
RBC: 5.25 MIL/uL (ref 4.22–5.81)
RDW: 15.9 % — ABNORMAL HIGH (ref 11.5–15.5)
WBC: 3.2 10*3/uL — ABNORMAL LOW (ref 4.0–10.5)
nRBC: 0 % (ref 0.0–0.2)

## 2020-01-22 LAB — HEMOGLOBIN AND HEMATOCRIT, BLOOD
HCT: 40.1 % (ref 39.0–52.0)
Hemoglobin: 13.1 g/dL (ref 13.0–17.0)

## 2020-01-22 LAB — PROTIME-INR
INR: 1.1 (ref 0.8–1.2)
Prothrombin Time: 13.8 seconds (ref 11.4–15.2)

## 2020-01-22 LAB — GLUCOSE, CAPILLARY: Glucose-Capillary: 135 mg/dL — ABNORMAL HIGH (ref 70–99)

## 2020-01-22 LAB — TYPE AND SCREEN
ABO/RH(D): O POS
Antibody Screen: NEGATIVE

## 2020-01-22 SURGERY — ESOPHAGOGASTRODUODENOSCOPY (EGD) WITH PROPOFOL
Anesthesia: General

## 2020-01-22 MED ORDER — SODIUM CHLORIDE 0.9 % IV SOLN: INTRAVENOUS | Status: DC

## 2020-01-22 MED ORDER — PROPOFOL 500 MG/50ML IV EMUL
INTRAVENOUS | Status: DC | PRN
  Administered 2020-01-22: 150 ug/kg/min via INTRAVENOUS

## 2020-01-22 MED ORDER — LIDOCAINE HCL (PF) 2 % IJ SOLN
INTRAMUSCULAR | Status: AC
  Filled 2020-01-22: qty 5

## 2020-01-22 MED ORDER — PROPOFOL 10 MG/ML IV BOLUS
INTRAVENOUS | Status: DC | PRN
  Administered 2020-01-22: 20 mg via INTRAVENOUS
  Administered 2020-01-22: 30 mg via INTRAVENOUS

## 2020-01-22 MED ORDER — LIDOCAINE HCL (CARDIAC) PF 100 MG/5ML IV SOSY
PREFILLED_SYRINGE | INTRAVENOUS | Status: DC | PRN
  Administered 2020-01-22: 50 mg via INTRAVENOUS

## 2020-01-22 MED ORDER — PROPOFOL 500 MG/50ML IV EMUL
INTRAVENOUS | Status: AC
  Filled 2020-01-22: qty 50

## 2020-01-22 NOTE — Transfer of Care (Signed)
Immediate Anesthesia Transfer of Care Note  Patient: Juan Garza  Procedure(s) Performed: ESOPHAGOGASTRODUODENOSCOPY (EGD) WITH PROPOFOL (N/A ) COLONOSCOPY WITH PROPOFOL (N/A )  Patient Location: Endoscopy Unit  Anesthesia Type:General  Level of Consciousness: drowsy  Airway & Oxygen Therapy: Patient Spontanous Breathing  Post-op Assessment: Report given to RN and Post -op Vital signs reviewed and stable  Post vital signs: Reviewed and stable  Last Vitals:  Vitals Value Taken Time  BP    Temp    Pulse 109 01/22/20 1004  Resp 18 01/22/20 1004  SpO2 96 % 01/22/20 1004  Vitals shown include unvalidated device data.  Last Pain:  Vitals:   01/22/20 0848  TempSrc: Temporal  PainSc: 0-No pain         Complications: No complications documented.

## 2020-01-22 NOTE — H&P (Signed)
Outpatient short stay form Pre-procedure 01/22/2020 9:03 AM Raylene Miyamoto MD, MPH  Primary Physician: Dr. Raechel Ache  Reason for visit:  Nausea/vomiting/weight loss/cologuard positive  History of present illness:   59 y/o gentleman with likely cirrhosis here for EGD/Colonoscopy for nausea/vomiting/weight loss. He has diabetes and is on several medicines for it. No blood thinners but platelets are 77. INR is 1.1. No family history of GI malignancies or liver disease. History of ileocectomy for large polyp which was in 2006. Last colonoscopy was in 2015 with no report available. No NSAIDS currently.    Current Facility-Administered Medications:  .  0.9 %  sodium chloride infusion, , Intravenous, Continuous, Analia Zuk, Hilton Cork, MD  Medications Prior to Admission  Medication Sig Dispense Refill Last Dose  . dutasteride (AVODART) 0.5 MG capsule Take by mouth.   01/21/2020 at Unknown time  . eszopiclone (LUNESTA) 2 MG TABS tablet Take by mouth.   Past Month at Unknown time  . omeprazole (PRILOSEC) 40 MG capsule Take 40 mg by mouth 2 (two) times daily.    01/21/2020 at Unknown time  . pregabalin (LYRICA) 75 MG capsule Take by mouth.   01/22/2020 at Unknown time  . acetaminophen (TYLENOL) 500 MG tablet Take 500 mg by mouth every 6 (six) hours as needed.   01/19/2020  . atorvastatin (LIPITOR) 10 MG tablet Take 10 mg by mouth daily.   01/20/2020  . empagliflozin (JARDIANCE) 10 MG TABS tablet Take 10 mg by mouth daily.   01/20/2020  . glipiZIDE (GLUCOTROL) 10 MG tablet Take 10 mg by mouth daily before breakfast.      . lisinopril (ZESTRIL) 10 MG tablet Take 10 mg by mouth daily.     . naproxen (NAPROSYN) 500 MG tablet Take 1 tablet (500 mg total) by mouth 2 (two) times daily. 20 tablet 0   . propranolol (INDERAL) 20 MG tablet Take 20 mg by mouth 2 (two) times daily.   01/20/2020 at Unknown time  . Semaglutide,0.25 or 0.5MG /DOS, (OZEMPIC, 0.25 OR 0.5 MG/DOSE,) 2 MG/1.5ML SOPN Inject into the skin.    01/13/2020     Allergies  Allergen Reactions  . Imitrex [Sumatriptan] Other (See Comments)    shaking  . Seroquel [Quetiapine Fumarate] Other (See Comments)    Leg spasms  . Adhesive [Tape] Rash     Past Medical History:  Diagnosis Date  . Chronic back pain   . Diabetes mellitus without complication ([place])     Review of systems:  Otherwise negative.    Physical Exam  Gen: Alert, oriented. Appears stated age.  HEENT: PERRLA. Lungs: No respiratory distress CV: RRR Abd: soft, benign, no masses. Ext: No edema    Planned procedures: Proceed with EGD/colonoscopy. The patient understands the nature of the planned procedure, indications, risks, alternatives and potential complications including but not limited to bleeding, infection, perforation, damage to internal organs and possible oversedation/side effects from anesthesia. The patient agrees and gives consent to proceed.  Please refer to procedure notes for findings, recommendations and patient disposition/instructions.     Raylene Miyamoto MD, MPH Gastroenterology 01/22/2020  9:03 AM

## 2020-01-22 NOTE — Anesthesia Postprocedure Evaluation (Signed)
Anesthesia Post Note  Patient: Juan Garza  Procedure(s) Performed: ESOPHAGOGASTRODUODENOSCOPY (EGD) WITH PROPOFOL (N/A ) COLONOSCOPY WITH PROPOFOL (N/A )  Patient location during evaluation: Endoscopy Anesthesia Type: General Level of consciousness: awake and alert Pain management: pain level controlled Vital Signs Assessment: post-procedure vital signs reviewed and stable Respiratory status: spontaneous breathing, nonlabored ventilation, respiratory function stable and patient connected to nasal cannula oxygen Cardiovascular status: blood pressure returned to baseline and stable Postop Assessment: no apparent nausea or vomiting Anesthetic complications: no   No complications documented.   Last Vitals:  Vitals:   01/22/20 1020 01/22/20 1030  BP: 132/67 130/79  Pulse: 95 100  Resp: (!) 27 18  Temp:    SpO2: 96% 100%    Last Pain:  Vitals:   01/22/20 1000  TempSrc: Temporal  PainSc:                  Precious Haws [name]

## 2020-01-22 NOTE — Interval H&P Note (Signed)
History and Physical Interval Note:  01/22/2020 9:10 AM  Juan Garza  has presented today for surgery, with the diagnosis of Irregular bowel habits, periumbilical abdominal pain, hx of colon polyps, weight loss, melena. dyspepsia,.  The various methods of treatment have been discussed with the patient and family. After consideration of risks, benefits and other options for treatment, the patient has consented to  Procedure(s): ESOPHAGOGASTRODUODENOSCOPY (EGD) WITH PROPOFOL (N/A) COLONOSCOPY WITH PROPOFOL (N/A) as a surgical intervention.  The patient's history has been reviewed, patient examined, no change in status, stable for surgery.  I have reviewed the patient's chart and labs.  Questions were answered to the patient's satisfaction.     Lesly Rubenstein  Ok to proceed with EGD/Colonoscopy

## 2020-01-22 NOTE — Op Note (Signed)
[facility] Gastroenterology Patient Name: Juan Garza Procedure Date: 01/22/2020 9:01 AM MRN: [id number] Account #: [id number] Date of Birth: [birth year] Admit Type: Outpatient Age: 59 Room: [facility] ENDO ROOM 1 Gender: Male Note Status: Finalized Procedure:             Upper GI endoscopy Indications:           Iron deficiency anemia, Gastro-esophageal reflux                         disease, Nausea with vomiting Providers:             Andrey Farmer MD, MD Medicines:             Monitored Anesthesia Care Complications:         No immediate complications. Estimated blood loss:                         Minimal. Procedure:             Pre-Anesthesia Assessment:                        - Prior to the procedure, a History and Physical was                         performed, and patient medications and allergies were                         reviewed. The patient is competent. The risks and                         benefits of the procedure and the sedation options and                         risks were discussed with the patient. All questions                         were answered and informed consent was obtained.                         Patient identification and proposed procedure were                         verified by the physician, the nurse, the anesthetist                         and the technician in the endoscopy suite. Mental                         Status Examination: alert and oriented. Airway                         Examination: normal oropharyngeal airway and neck                         mobility. Respiratory Examination: clear to                         auscultation. CV Examination: normal. Prophylactic  Antibiotics: The patient does not require prophylactic                         antibiotics. Prior Anticoagulants: The patient has                         taken no previous anticoagulant or antiplatelet                         agents.  ASA Grade Assessment: III - A patient with                         severe systemic disease. After reviewing the risks and                         benefits, the patient was deemed in satisfactory                         condition to undergo the procedure. The anesthesia                         plan was to use monitored anesthesia care (MAC).                         Immediately prior to administration of medications,                         the patient was re-assessed for adequacy to receive                         sedatives. The heart rate, respiratory rate, oxygen                         saturations, blood pressure, adequacy of pulmonary                         ventilation, and response to care were monitored                         throughout the procedure. The physical status of the                         patient was re-assessed after the procedure.                        After obtaining informed consent, the endoscope was                         passed under direct vision. Throughout the procedure,                         the patient's blood pressure, pulse, and oxygen                         saturations were monitored continuously. The Endoscope                         was introduced through the mouth, and advanced to the  second part of duodenum. The upper GI endoscopy was                         accomplished without difficulty. The patient tolerated                         the procedure well. Findings:      Grade II varices were found in the lower third of the esophagus.      Scattered moderate inflammation characterized by erythema was found in       the gastric antrum. Biopsies were taken with a cold forceps for       Helicobacter pylori testing. Estimated blood loss was minimal.      Mild portal hypertensive gastropathy was found in the entire examined       stomach.      The examined duodenum was normal. Impression:            - Grade II esophageal  varices.                        - Gastritis. Biopsied.                        - Portal hypertensive gastropathy.                        - Normal examined duodenum. Recommendation:        - Discharge patient to home.                        - Resume previous diet.                        - Give a beta blocker with dosage titrated by the                         heart rate.                        - Await pathology results.                        - Return to referring physician as previously                         scheduled. Procedure Code(s):     --- Professional ---                        [id number], Esophagogastroduodenoscopy, flexible,                         transoral; with biopsy, single or multiple Diagnosis Code(s):     --- Professional ---                        I85.00, Esophageal varices without bleeding                        K29.70, Gastritis, unspecified, without bleeding                        K76.6, Portal hypertension  K31.89, Other diseases of stomach and duodenum                        D50.9, Iron deficiency anemia, unspecified                        K21.9, Gastro-esophageal reflux disease without                         esophagitis                        R11.2, Nausea with vomiting, unspecified CPT copyright 2019 American Medical Association. All rights reserved. The codes documented in this report are preliminary and upon coder review may  be revised to meet current compliance requirements. Andrey Farmer, MD Andrey Farmer MD, MD 01/22/2020 10:01:38 AM Number of Addenda: 0 Note Initiated On: 01/22/2020 9:01 AM Estimated Blood Loss:  Estimated blood loss was minimal.      [facility]

## 2020-01-22 NOTE — ED Triage Notes (Addendum)
Pt arrived via POV with reports of bright red and dark red blood when trying to have bowel movement. Pt reports he had colonoscopy and EGD this morning.  Pt had polyps removed x 4. Pt reports he has lower abd pain but states the pain is the same pain as it was prior to today's procedure, pt states the pain is not any worse.  Pt also has hx of cirrhosis of the liver which is a newer dx. Pt also has hx of low platelets.   Pt not taking any blood thinners.

## 2020-01-22 NOTE — Op Note (Signed)
[facility] Gastroenterology Patient Name: Juan Garza Procedure Date: 01/22/2020 9:00 AM MRN: [id number] Account #: [id number] Date of Birth: [birth year] Admit Type: Outpatient Age: 59 Room: [facility] ENDO ROOM 1 Gender: Male Note Status: Finalized Procedure:             Colonoscopy Indications:           Iron deficiency anemia, Positive Cologuard test Providers:             Andrey Farmer MD, MD Medicines:             Monitored Anesthesia Care Complications:         No immediate complications. Estimated blood loss:                         Minimal. Procedure:             Pre-Anesthesia Assessment:                        - Prior to the procedure, a History and Physical was                         performed, and patient medications and allergies were                         reviewed. The patient is competent. The risks and                         benefits of the procedure and the sedation options and                         risks were discussed with the patient. All questions                         were answered and informed consent was obtained.                         Patient identification and proposed procedure were                         verified by the physician, the nurse, the anesthetist                         and the technician in the endoscopy suite. Mental                         Status Examination: alert and oriented. Airway                         Examination: normal oropharyngeal airway and neck                         mobility. Respiratory Examination: clear to                         auscultation. CV Examination: normal. Prophylactic                         Antibiotics: The patient does not require prophylactic  antibiotics. Prior Anticoagulants: The patient has                         taken no previous anticoagulant or antiplatelet                         agents. ASA Grade Assessment: III - A patient with                          severe systemic disease. After reviewing the risks and                         benefits, the patient was deemed in satisfactory                         condition to undergo the procedure. The anesthesia                         plan was to use monitored anesthesia care (MAC).                         Immediately prior to administration of medications,                         the patient was re-assessed for adequacy to receive                         sedatives. The heart rate, respiratory rate, oxygen                         saturations, blood pressure, adequacy of pulmonary                         ventilation, and response to care were monitored                         throughout the procedure. The physical status of the                         patient was re-assessed after the procedure.                        After obtaining informed consent, the colonoscope was                         passed under direct vision. Throughout the procedure,                         the patient's blood pressure, pulse, and oxygen                         saturations were monitored continuously. The                         Colonoscope was introduced through the anus and                         advanced to the the ileocolonic anastomosis. The  colonoscopy was somewhat difficult due to a redundant                         colon. The patient tolerated the procedure well. The                         quality of the bowel preparation was fair. Findings:      The perianal and digital rectal examinations were normal.      A 2 mm polyp was found in the transverse colon. The polyp was sessile.       The polyp was removed with a jumbo cold forceps. Resection and retrieval       were complete. Estimated blood loss was minimal.      A 3 mm polyp was found in the transverse colon. The polyp was sessile.       The polyp was removed with a cold snare. Resection and retrieval were       complete.  Estimated blood loss was minimal.      Two sessile and semi-pedunculated polyps were found in the descending       colon. The polyps were 3 to 7 mm in size. These polyps were removed with       a cold snare. Resection and retrieval were complete. Estimated blood       loss was minimal.      The exam was otherwise without abnormality on direct and retroflexion       views. Impression:            - Preparation of the colon was fair.                        - One 2 mm polyp in the transverse colon, removed with                         a jumbo cold forceps. Resected and retrieved.                        - One 3 mm polyp in the transverse colon, removed with                         a cold snare. Resected and retrieved.                        - Two 3 to 7 mm polyps in the descending colon,                         removed with a cold snare. Resected and retrieved.                        - The examination was otherwise normal on direct and                         retroflexion views. Recommendation:        - Discharge patient to home.                        - Resume previous diet.                        -  Continue present medications.                        - Await pathology results.                        - Repeat colonoscopy in 1 year because the bowel                         preparation was suboptimal.                        - Return to referring physician as previously                         scheduled. Procedure Code(s):     --- Professional ---                        [phone], Colonoscopy, flexible; with removal of                         tumor(s), polyp(s), or other lesion(s) by snare                         technique                        45380, 25, Colonoscopy, flexible; with biopsy, single                         or multiple Diagnosis Code(s):     --- Professional ---                        K63.5, Polyp of colon                        D50.9, Iron deficiency anemia, unspecified                         R19.5, Other fecal abnormalities CPT copyright 2019 American Medical Association. All rights reserved. The codes documented in this report are preliminary and upon coder review may  be revised to meet current compliance requirements. Andrey Farmer, MD Andrey Farmer MD, MD 01/22/2020 10:07:12 AM Number of Addenda: 0 Note Initiated On: 01/22/2020 9:00 AM Scope Withdrawal Time: 0 hours 18 minutes 9 seconds  Total Procedure Duration: 0 hours 25 minutes 2 seconds  Estimated Blood Loss:  Estimated blood loss was minimal.      [facility]

## 2020-01-22 NOTE — Anesthesia Preprocedure Evaluation (Signed)
Anesthesia Evaluation  Patient identified by MRN, date of birth, ID band Patient awake    Reviewed: Allergy & Precautions, H&P , NPO status , Patient's Chart, lab work & pertinent test results  History of Anesthesia Complications Negative for: history of anesthetic complications  Airway Mallampati: III  TM Distance: >3 FB Neck ROM: limited    Dental  (+) Chipped   Pulmonary neg shortness of breath, sleep apnea , former smoker,    Pulmonary exam normal        Cardiovascular Exercise Tolerance: Good hypertension, Normal cardiovascular exam     Neuro/Psych  Headaches,  Neuromuscular disease negative psych ROS   GI/Hepatic Neg liver ROS, GERD  Medicated and Controlled,  Endo/Other  diabetes, Type 2  Renal/GU negative Renal ROS  negative genitourinary   Musculoskeletal   Abdominal   Peds  Hematology negative hematology ROS (+)   Anesthesia Other Findings Past Medical History: No date: Chronic back pain No date: Diabetes mellitus without complication ([facility])  Past Surgical History: No date: ABDOMINAL SURGERY No date: APPENDECTOMY No date: BACK SURGERY     Comment:  posteriior lumbar spine fusion No date: CARDIAC CATHETERIZATION No date: CARDIAC VALVE REPLACEMENT No date: colectomy partial w/anastoamosis No date: HERNIA REPAIR No date: JOINT REPLACEMENT No date: KNEE SURGERY; Left No date: NASAL SINUS SURGERY  BMI    Body Mass Index: 32.87 kg/m      Reproductive/Obstetrics negative OB ROS                             Anesthesia Physical Anesthesia Plan  ASA: III  Anesthesia Plan: General   Post-op Pain Management:    Induction: Intravenous  PONV Risk Score and Plan: Propofol infusion and TIVA  Airway Management Planned: Natural Airway and Nasal Cannula  Additional Equipment:   Intra-op Plan:   Post-operative Plan:   Informed Consent: I have reviewed the patients  History and Physical, chart, labs and discussed the procedure including the risks, benefits and alternatives for the proposed anesthesia with the patient or authorized representative who has indicated his/her understanding and acceptance.     Dental Advisory Given  Plan Discussed with: Anesthesiologist, CRNA and Surgeon  Anesthesia Plan Comments: (Patient reports very bad contact dermatitis to EKG leads and would like [place] to minimize EKG lead contact time which we plan to do.  Plan to place EKG leads in procedure room.  Patient consented for risks of anesthesia including but not limited to:  - adverse reactions to medications - risk of airway placement if required - damage to eyes, teeth, lips or other oral mucosa - nerve damage due to positioning  - sore throat or hoarseness - Damage to heart, brain, nerves, lungs, other parts of body or loss of life  Patient voiced understanding.)        Anesthesia Quick Evaluation

## 2020-01-22 NOTE — ED Provider Notes (Addendum)
[facility] Emergency Department Provider Note   ____________________________________________   I have reviewed the triage vital signs and the nursing notes.   HISTORY  Chief Complaint Rectal Bleeding   History limited by: Not Limited   HPI Juan Garza is a 59 y.o. male who presents to the emergency department today because of concern for rectal bleeding. The patient did have a colonoscopy and EGD performed earlier today. The patient states that shortly after getting home he noticed bright red blood in his stool. This then happened roughly 3-4 times and last episode was at 5 pm. The patient denies any new abdominal pain. Denies any shortness of breath or weakness. Does have known history of thrombocytopenia.    Records reviewed. Per medical record review patient has a history of DM, thrombocytopenia.   Past Medical History:  Diagnosis Date  . Chronic back pain   . Diabetes mellitus without complication [facility])     Patient Active Problem List   Diagnosis Date Noted  . Myelodysplastic syndrome ([place]) 12/27/2019  . Splenomegaly 12/11/2019  . Elevated bilirubin 12/11/2019  . Thrombocytopenia ([place]) 11/27/2019  . Leukopenia 11/27/2019  . Iron deficiency anemia 11/27/2019  . High risk medication use 10/09/2019  . 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 History of lumbar spinal fusion 01/03/2019  . Left leg weakness 01/03/2019  . Hair loss 04/16/2016  . Severe obesity (BMI 35.0-39.9) with comorbidity ([place]) 04/16/2016  . Chronic daily headache 11/13/2015  . Facial weakness 11/03/2015  . Low back pain radiating down leg 07/30/2015  . Left lumbar radiculopathy 05/30/2015  . Hyperlipidemia associated with type 2 diabetes mellitus ([place]) 01/23/2014  . Hyperlipidemia with target LDL less than 70 01/23/2014  . Tremor 01/23/2014  . Dry eye of right side 10/29/2013  . Diabetic polyneuropathy associated with type 2 diabetes mellitus ([place]) 04/23/2013  . Elevated  transaminase level 04/23/2013  . Essential hypertension 01/12/2013  . Type 2 diabetes mellitus with peripheral neuropathy ([place]) 10/03/2012  . GERD (gastroesophageal reflux disease) 09/04/2012  . Gout 09/04/2012  . Migraine without aura and without status migrainosus, not intractable 09/04/2012  . Obstructive sleep apnea on CPAP 09/04/2012  . Primary insomnia 09/04/2012  . S/P total knee replacement, left 09/04/2012    Past Surgical History:  Procedure Laterality Date  . ABDOMINAL SURGERY    . APPENDECTOMY    . BACK SURGERY     posteriior lumbar spine fusion  . CARDIAC CATHETERIZATION    . CARDIAC VALVE REPLACEMENT    . colectomy partial w/anastoamosis    . HERNIA REPAIR    . JOINT REPLACEMENT    . KNEE SURGERY Left   . NASAL SINUS SURGERY      Prior to Admission medications   Medication Sig Start Date End Date Taking? Authorizing Provider  acetaminophen (TYLENOL) 500 MG tablet Take 500 mg by mouth every 6 (six) hours as needed.    [provider]  atorvastatin (LIPITOR) 10 MG tablet Take 10 mg by mouth daily.    [provider]  dutasteride (AVODART) 0.5 MG capsule Take by mouth. 12/18/18   [provider]  empagliflozin (JARDIANCE) 10 MG TABS tablet Take 10 mg by mouth daily.    [provider]  eszopiclone (LUNESTA) 2 MG TABS tablet Take by mouth. 04/26/19   [provider]  glipiZIDE (GLUCOTROL) 10 MG tablet Take 10 mg by mouth daily before breakfast.     [provider]  lisinopril (ZESTRIL) 10 MG tablet Take 10 mg by  mouth daily.    [provider]  naproxen (NAPROSYN) 500 MG tablet Take 1 tablet (500 mg total) by mouth 2 (two) times daily. 12/22/18   Melynda Ripple, MD  omeprazole (PRILOSEC) 40 MG capsule Take 40 mg by mouth 2 (two) times daily.     [provider]  pregabalin (LYRICA) 75 MG capsule Take by mouth. 01/18/19   [provider]  propranolol (INDERAL) 20 MG tablet Take 20 mg by  mouth 2 (two) times daily.    [provider]  Semaglutide,0.25 or 0.5MG /DOS, (OZEMPIC, 0.25 OR 0.5 MG/DOSE,) 2 MG/1.5ML SOPN Inject into the skin. 05/22/19   [provider]  gabapentin (NEURONTIN) 300 MG capsule Take 300 mg by mouth at bedtime.  08/08/19  [provider]  sitaGLIPtin-metformin (JANUMET) 50-1000 MG tablet Take 1 tablet by mouth daily.  08/08/19  [provider]    Allergies Imitrex [sumatriptan], Seroquel [quetiapine fumarate], and Adhesive [tape]  Family History  Problem Relation Age of Onset  . Atrial fibrillation Mother   . Diabetes Father     Social History Social History   Tobacco Use  . Smoking status: Former Research scientist (life sciences)  . Smokeless tobacco: Never Used  Vaping Use  . Vaping Use: Never used  Substance Use Topics  . Alcohol use: Yes    Comment: none last 24hrs  . Drug use: Never    Review of Systems Constitutional: No fever/chills Eyes: No visual changes. ENT: No sore throat. Cardiovascular: Denies chest pain. Respiratory: Denies shortness of breath. Gastrointestinal: Positive for abdominal pain and bright red blood per rectum.   Genitourinary: Negative for dysuria. Musculoskeletal: Negative for back pain. Skin: Negative for rash. Neurological: Negative for headaches, focal weakness or numbness.  ____________________________________________   PHYSICAL EXAM:  VITAL SIGNS: ED Triage Vitals  Enc Vitals Group     BP 01/22/20 2012 (!) 148/85     Pulse Rate 01/22/20 2012 77     Resp 01/22/20 2012 18     Temp 01/22/20 2012 98.4 F (36.9 C)     Temp Source 01/22/20 2012 Oral     SpO2 01/22/20 2012 99 %     Weight 01/22/20 2014 256 lb (116.1 kg)     Height 01/22/20 2014 6\' 2"  (1.88 m)     Head Circumference --      Peak Flow --      Pain Score 01/22/20 2013 5   Constitutional: Alert and oriented.  Eyes: Conjunctivae are normal.  ENT      Head: Normocephalic and atraumatic.      Nose: No  congestion/rhinnorhea.      Mouth/Throat: Mucous membranes are moist.      Neck: No stridor. Hematological/Lymphatic/Immunilogical: No cervical lymphadenopathy. Cardiovascular: Normal rate, regular rhythm.  No murmurs, rubs, or gallops.  Respiratory: Normal respiratory effort without tachypnea nor retractions. Breath sounds are clear and equal bilaterally. No wheezes/rales/rhonchi. Gastrointestinal: Soft and slightly tender to palpation on the left side. Genitourinary: Deferred Musculoskeletal: Normal range of motion in all extremities. No lower extremity edema. Neurologic:  Normal speech and language. No gross focal neurologic deficits are appreciated.  Skin:  Skin is warm, dry and intact. No rash noted. Psychiatric: Mood and affect are normal. Speech and behavior are normal. Patient exhibits appropriate insight and judgment.  ____________________________________________    LABS (pertinent positives/negatives)  CMP wnl except glu 152, ast 50, t bili 2.0 INR 1.1 CBC wbc 3.2, hgb 14.4, plt 73 Repeat hgb 13.1 ____________________________________________   EKG  None  ____________________________________________    RADIOLOGY  None  ____________________________________________   PROCEDURES  Procedures  ____________________________________________   INITIAL IMPRESSION / ASSESSMENT AND PLAN / ED COURSE  Pertinent labs & imaging results that were available during my care of the patient were reviewed by me and considered in my medical decision making (see chart for details).   Patient presented to the emergency department today because of concerns for rectal bleeding after having a colonoscopy performed earlier today.  Last episode was around 5 PM today.  Patient's initial hemoglobin 14.  Patient not tachycardic nor hypotensive.  Discussed with Dr. Bonna Gains with GI.  Will plan on repeat hemoglobin and if stable patient can be discharged.  I discussed plan with patient.  In  the event of discharge discussed return precautions.  Repeat hemoglobin 13.1.  I discussed this finding with the patient.  Did discuss that it was a drop in his blood level.  However patient without any further episodes of bloody stool here in the emergency department.  Patient's vital signs without any concerning tachycardia or hypotension.  I did discuss with the patient that I think would be reasonable to discharge him home.  Discussed strict return precautions.  I did offer to admit patient if he felt uncomfortable for further observation but again given lack of further bleeding and normal vital signs did think it be reasonable for patient to go home.  He stated he felt comfortable with discharge home.  We did discuss returning for any further bleeding.  Did discuss contacting GI office tomorrow.  Also discussed return for shortness of breath or weakness or other signs of blood loss.  ____________________________________________   FINAL CLINICAL IMPRESSION(S) / ED DIAGNOSES  Final diagnoses:  Rectal bleeding     Note: This dictation was prepared with Dragon dictation. Any transcriptional errors that result from this process are unintentional     Nance Pear, MD 01/22/20 0932    Nance Pear, MD 01/22/20 2328

## 2020-01-22 NOTE — Discharge Instructions (Addendum)
Please seek medical attention for any high fevers, chest pain, shortness of breath, change in behavior, persistent vomiting, bloody stool or any other new or concerning symptoms.  

## 2020-01-23 ENCOUNTER — Encounter: Payer: Self-pay | Admitting: Hematology and Oncology

## 2020-01-23 ENCOUNTER — Encounter: Payer: Self-pay | Admitting: Gastroenterology

## 2020-01-23 LAB — SURGICAL PATHOLOGY

## 2020-01-24 ENCOUNTER — Inpatient Hospital Stay: Payer: Commercial Managed Care - PPO

## 2020-02-20 ENCOUNTER — Other Ambulatory Visit: Payer: Self-pay | Admitting: Hematology and Oncology

## 2020-02-20 DIAGNOSIS — D509 Iron deficiency anemia, unspecified: Secondary | ICD-10-CM

## 2020-02-20 NOTE — Progress Notes (Signed)
[facility]  [address] [place], [place] [address] Phone: [phone]  Fax: [phone]   Clinic Day:  02/21/2020  Referring physician: Ezequiel Kayser, MD  Chief Complaint: Juan Garza is a 60 y.o. male with a low grade myelodysplastic syndrome who is seen for 2 month assessment.  HPI: The patient was last seen in the hematology clinic on 12/27/2019. At that time, he felt "fine".  He had nosebleeds every week, joint pain, heartburn, and abdominal pain. His nausea had worsened over the past few days. He was not taking oral iron. He denied melena.  Exam revealed no adenopathy or hepatosplenomegaly.  Abdomen and pelvis CT with contrast on 01/02/2020 revealed morphologic changes to the liver raising the possibility of cirrhosis. There were sequelae of portal venous hypertension including splenomegaly (15.5 cm) and upper abdominal collateral vessels. Consider further evaluation of the liver with pre and post contrast-enhanced MRI. There was cholelithiasis. There was mild gallbladder wall thickening which may be secondary to portal venous hypertension/cirrhosis. Consider further evaluation with right upper quadrant ultrasound as clinically indicated.  EGD on 01/22/2020 by Dr. Haig Prophet revealed grade II esophageal varices, gastritis, and portal hypertensive gastropathy. Colonoscopy showed one 2 mm polyp in the transverse colon, one 3 mm polyp in the transverse colon, and two 3 to 7 mm polyps in the descending colon (3 tubular adenomas, one hyperplastic polyp).  The patient was seen in the [facility] ER on 01/22/2020 for bright red blood in the stool after EGD and colonoscopy. His hemoglobin on arrival was 14.4. After discussing the case with Dr. Bonna Gains, the decision was made to repeat his hemoglobin. Repeat hemoglobin was 13.1. The patient had not had any additional episodes of blood in the stool and his vitals were stable. He was discharged home.  He was seen by  Laurine Blazer, PA on 02/19/2019. He was prescribed Carafate for nausea and reflux.  Repeat EGD is planned in 1 year.  AFP was 3.4 on 12/19/2019.  Follow up was planned for 2-3 months.  During the interim, he has been "ok." His nosebleeds, heartburn, abdominal pain, left second finger cramps, easy bruising, and joint pain are stable. His nausea has improved. He has not had any additional blood in the stool. He is "sort of" eating ok.  He denies any new medications besides Carafate. He denies herbal product use.  He states that he is not opposed to a transfusion support if needed.   Past Medical History:  Diagnosis Date  . Chronic back pain   . Diabetes mellitus without complication [facility])     Past Surgical History:  Procedure Laterality Date  . ABDOMINAL SURGERY    . APPENDECTOMY    . BACK SURGERY     posteriior lumbar spine fusion  . CARDIAC CATHETERIZATION    . CARDIAC VALVE REPLACEMENT    . colectomy partial w/anastoamosis    . COLONOSCOPY WITH PROPOFOL N/A 01/22/2020   Procedure: COLONOSCOPY WITH PROPOFOL;  Surgeon: Lesly Rubenstein, MD;  Location: [facility] ENDOSCOPY;  Service: Endoscopy;  Laterality: N/A;  . ESOPHAGOGASTRODUODENOSCOPY (EGD) WITH PROPOFOL N/A 01/22/2020   Procedure: ESOPHAGOGASTRODUODENOSCOPY (EGD) WITH PROPOFOL;  Surgeon: Lesly Rubenstein, MD;  Location: [facility] ENDOSCOPY;  Service: Endoscopy;  Laterality: N/A;  . HERNIA REPAIR    . JOINT REPLACEMENT    . KNEE SURGERY Left   . NASAL SINUS SURGERY      Family History  Problem Relation Age of Onset  . Atrial fibrillation Mother   . Diabetes Father  Social History:  reports that he has quit smoking. He has never used smokeless tobacco. He reports current alcohol use. He reports that he does not use drugs. He smoked 1.5 packs per day for ~20 years and quit in 1996. He denies drug use. He drinks 3 drinks weekly, usually rum and coke. 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 He lives in [place]. 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 The patient is alone today.  Allergies:  Allergies  Allergen Reactions  . Imitrex [Sumatriptan] Other (See Comments)    shaking  . Seroquel [Quetiapine Fumarate] Other (See Comments)    Leg spasms  . Adhesive [Tape] Rash    Current Medications: Current Outpatient Medications  Medication Sig Dispense Refill  . acetaminophen (TYLENOL) 500 MG tablet Take 500 mg by mouth every 6 (six) hours as needed.    Marland Kitchen. atorvastatin (LIPITOR) 10 MG tablet Take 10 mg by mouth daily.    Marland Kitchen. dutasteride (AVODART) 0.5 MG capsule Take by mouth.    . empagliflozin (JARDIANCE) 10 MG TABS tablet Take 10 mg by mouth daily.    . eszopiclone (LUNESTA) 2 MG TABS tablet Take by mouth.    Marland Kitchen. glipiZIDE (GLUCOTROL) 10 MG tablet Take 10 mg by mouth daily before breakfast.     . lisinopril (ZESTRIL) 10 MG tablet Take 10 mg by mouth daily.    . naproxen (NAPROSYN) 500 MG tablet Take 1 tablet (500 mg total) by mouth 2 (two) times daily. 20 tablet 0  . omeprazole (PRILOSEC) 40 MG capsule Take 40 mg by mouth 2 (two) times daily.     . pregabalin (LYRICA) 75 MG capsule Take by mouth.    . propranolol (INDERAL) 20 MG tablet Take 20 mg by mouth 2 (two) times daily.    . Semaglutide,0.25 or 0.5MG /DOS, (OZEMPIC, 0.25 OR 0.5 MG/DOSE,) 2 MG/1.5ML SOPN Inject into the skin.     No current facility-administered medications for this visit.    Review of Systems  Constitutional: Negative for chills, diaphoresis, fever, malaise/fatigue and weight loss (up 6 lbs).       Feels "ok."  HENT: Positive for nosebleeds (occasional). Negative for congestion, ear discharge, ear pain, hearing loss, sinus pain, sore throat and tinnitus.   Eyes: Negative for blurred vision and double vision.  Respiratory: Negative for cough, hemoptysis, sputum production and shortness of breath.   Cardiovascular: Negative for chest pain, palpitations and leg swelling.  Gastrointestinal: Positive for abdominal pain (comes and goes),  heartburn (reflux) and nausea (improved). Negative for blood in stool, constipation, diarrhea, melena and vomiting.       Eating well.  Genitourinary: Negative for dysuria, frequency, hematuria and urgency.  Musculoskeletal: Positive for joint pain (hips) and myalgias (left second finger). Negative for back pain, falls and neck pain.  Skin: Negative for itching and rash.  Neurological: Negative for dizziness, tingling, sensory change, weakness and headaches.  Endo/Heme/Allergies: Bruises/bleeds easily (easy bruising).  Psychiatric/Behavioral: Negative for depression and memory loss. The patient is not nervous/anxious and does not have insomnia.   All other systems reviewed and are negative.  Performance status (ECOG): 1  Vitals Blood pressure 120/67, pulse 67, temperature 97.8 F (36.6 C), resp. rate 20, weight 266 lb 13.9 oz (121.1 kg), SpO2 99 %.   Physical Exam Vitals and nursing note reviewed.  Constitutional:      General: He is not in acute distress.    Appearance: He is not diaphoretic.  HENT:  Head: Normocephalic and atraumatic.     Comments: Red hair and beard.    Mouth/Throat:     Mouth: Mucous membranes are moist.     Pharynx: Oropharynx is clear.  Eyes:     General: No scleral icterus.    Extraocular Movements: Extraocular movements intact.     Conjunctiva/sclera: Conjunctivae normal.     Pupils: Pupils are equal, round, and reactive to light.     Comments: Blue eyes.  Cardiovascular:     Rate and Rhythm: Normal rate and regular rhythm.     Heart sounds: Normal heart sounds. No murmur heard.   Pulmonary:     Effort: Pulmonary effort is normal. No respiratory distress.     Breath sounds: Normal breath sounds. No wheezing or rales.  Chest:     Chest wall: No tenderness.  Breasts:     Right: No axillary adenopathy or supraclavicular adenopathy.     Left: No axillary adenopathy or supraclavicular adenopathy.    Abdominal:     General: Bowel sounds are  normal. There is no distension.     Palpations: Abdomen is soft. There is no mass.     Tenderness: There is no abdominal tenderness. There is no guarding or rebound.  Musculoskeletal:        General: No swelling or tenderness. Normal range of motion.     Cervical back: Normal range of motion and neck supple.  Lymphadenopathy:     Head:     Right side of head: No preauricular, posterior auricular or occipital adenopathy.     Left side of head: No preauricular, posterior auricular or occipital adenopathy.     Cervical: No cervical adenopathy.     Upper Body:     Right upper body: No supraclavicular or axillary adenopathy.     Left upper body: No supraclavicular or axillary adenopathy.     Lower Body: No right inguinal adenopathy. No left inguinal adenopathy.  Skin:    General: Skin is warm and dry.     Comments: Lower abdominal bruising.  Neurological:     Mental Status: He is alert and oriented to person, place, and time.  Psychiatric:        Behavior: Behavior normal.        Thought Content: Thought content normal.        Judgment: Judgment normal.    Appointment on 02/21/2020  Component Date Value Ref Range Status  . Iron 02/21/2020 32* 45 - 182 ug/dL Final  . TIBC 02/21/2020 482* 250 - 450 ug/dL Final  . Saturation Ratios 02/21/2020 7* 17.9 - 39.5 % Final  . UIBC 02/21/2020 450  ug/dL Final   Performed at [facility], [address], [place], [place] [address]  . Ferritin 02/21/2020 23* 24 - 336 ng/mL Final   Performed at [facility], [address] [place], [place] [address]  . Sodium 02/21/2020 136  135 - 145 mmol/L Final  . Potassium 02/21/2020 4.3  3.5 - 5.1 mmol/L Final  . Chloride 02/21/2020 100  98 - 111 mmol/L Final  . CO2 02/21/2020 34* 22 - 32 mmol/L Final  . Glucose, Bld 02/21/2020 155* 70 - 99 mg/dL Final   Glucose reference range applies only to samples taken after fasting for at least 8 hours.  . BUN 02/21/2020 14  6 - 20 mg/dL Final  .  Creatinine, Ser 02/21/2020 0.72  0.61 - 1.24 mg/dL Final  . Calcium 02/21/2020 9.0  8.9 - 10.3 mg/dL Final  .  Total Protein 02/21/2020 6.3* 6.5 - 8.1 g/dL Final  . Albumin 02/21/2020 3.9  3.5 - 5.0 g/dL Final  . AST 02/21/2020 47* 15 - 41 U/L Final  . ALT 02/21/2020 35  0 - 44 U/L Final  . Alkaline Phosphatase 02/21/2020 65  38 - 126 U/L Final  . Total Bilirubin 02/21/2020 0.9  0.3 - 1.2 mg/dL Final  . GFR, Estimated 02/21/2020 >60  >60 mL/min Final   Comment: (NOTE) Calculated using the CKD-EPI Creatinine Equation (2021)   . Anion gap 02/21/2020 2* 5 - 15 Final   Performed at [facility], [address] [place], [place] [address]  . WBC 02/21/2020 2.2* 4.0 - 10.5 K/uL Final  . RBC 02/21/2020 4.78  4.22 - 5.81 MIL/uL Final  . Hemoglobin 02/21/2020 12.4* 13.0 - 17.0 g/dL Final  . HCT 02/21/2020 39.1  39.0 - 52.0 % Final  . MCV 02/21/2020 81.8  80.0 - 100.0 fL Final  . MCH 02/21/2020 25.9* 26.0 - 34.0 pg Final  . MCHC 02/21/2020 31.7  30.0 - 36.0 g/dL Final  . RDW 02/21/2020 15.8* 11.5 - 15.5 % Final  . Platelets 02/21/2020 58* 150 - 400 K/uL Final   Comment: Immature Platelet Fraction may be clinically indicated, consider ordering this additional test [id number]   . nRBC 02/21/2020 0.0  0.0 - 0.2 % Final  . Neutrophils Relative % 02/21/2020 52  % Final  . Neutro Abs 02/21/2020 1.1* 1.7 - 7.7 K/uL Final  . Lymphocytes Relative 02/21/2020 30  % Final  . Lymphs Abs 02/21/2020 0.7  0.7 - 4.0 K/uL Final  . Monocytes Relative 02/21/2020 14  % Final  . Monocytes Absolute 02/21/2020 0.3  0.1 - 1.0 K/uL Final  . Eosinophils Relative 02/21/2020 3  % Final  . Eosinophils Absolute 02/21/2020 0.1  0.0 - 0.5 K/uL Final  . Basophils Relative 02/21/2020 1  % Final  . Basophils Absolute 02/21/2020 0.0  0.0 - 0.1 K/uL Final  . Immature Granulocytes 02/21/2020 0  % Final  . Abs Immature Granulocytes 02/21/2020 0.00  0.00 - 0.07 K/uL Final   Performed at [facility], [address] [place], [place] [address]  . TSH 02/21/2020 2.249  0.350 - 4.500 uIU/mL Final   Comment: Performed by a 3rd Generation assay with a functional sensitivity of <=0.01 uIU/mL. Performed at [facility], [address] [place], [place] [address]   . Folate 02/21/2020 6.8  >5.9 ng/mL Final   Performed at [facility], [address] [place], [place] [address]  . Vitamin B-12 02/21/2020 841  180 - 914 pg/mL Final   Comment: (NOTE) This assay is not validated for testing neonatal or myeloproliferative syndrome specimens for Vitamin B12 levels. Performed at [facility], [place] [address], [place], [place] [address]   . Immature Platelet Fraction 02/21/2020 8.2  1.2 - 8.6 % Final   Performed at [facility], [address], [place], [place] [address]     Assessment:  Juan Garza is a 60 y.o. 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  He presented with leukopenia and thrombocytopenia.  Bone marrow on 12/17/2019 revealed a hypercellular marrow with dysmegakaryocytopoiesis. The features were suggestive of a low-grade myelodysplastic syndrome. Cytogenetics were normal (46, XY).  IPSS score 0 (low) and IPSS-R (0.5).  CBC on 11/14/2019 revealed a hematocrit 43.3, hemoglobin 14.2, MCV 85.9, platelets 77,000, WBC 3,600 (ANC 2,000).  SPEP was normal on 10/17/2019. Vitamin B12 was  838 on 10/17/2019.  TSH and free T4 were normal on 04/16/2019.  Work-up on 11/27/2019 revealed a hematocrit of 47.5, hemoglobin 15.3, platelets 82,000, WBC 5,000. Platelet count in a citrate was 81,400.  AST was 45, ALT 45, and bilirubin 1.8. Normal labs included PT (31), PT (14.1), LDH (176),  copper (78), TSH (1.579), free T4 (0.70), folate (6.8), ANA, H pylori antibody, HIV antibody, hepatitis B core antibody, and hepatitis C antibody were negative.  Immature platelet fraction was 7.9% (normal) on 12/11/2019.   Peripheral smear revealed mild thrombocytopenia without  clumping, which has been persistent. RBCs were normochromic, with mild anisocytosis. There was no poikilocytosis or fragmentation.  There were no abnormal or immature leukocytes were identified.  Abdominal ultrasound on 12/07/2019 revealed diffusely increased echotexture throughout the liver c/w fatty infiltration. Portal vein was patent on Doppler with normal direction of blood flow toward the liver.  There was splenomegaly (13.8 cm). There was cholelithiasis. There was no evidence of acute cholecystitis.  Abdomen and pelvis CT on 01/02/2020 revealed morphologic changes to the liver raising the possibility of cirrhosis. There were sequelae of portal venous hypertension including splenomegaly (15.5 cm) and upper abdominal collateral vessels. There was cholelithiasis. There was mild gallbladder wall thickening which may be secondary to portal venous hypertension/cirrhosis.  AFP was 3.4 on 12/19/2019.  EGD on 01/22/2020 revealed grade II esophageal varices, gastritis, and portal hypertensive gastropathy. Colonoscopy showed one 2 mm polyp in the transverse colon, one 3 mm polyp in the transverse colon, and two 3 to 7 mm polyps in the descending colon (3 tubular adenomas, one hyperplastic polyp).  He has iron deficiency.  He has a history of upper GI bleed felt related to ibuprofen in 06/2019. Ferritin was 9 on 05/28/2019 and 19 on 10/17/2019. Iron saturation was 6% and TIBC was 467 on 05/28/2019. Reticulocyte count was 1.34% on 10/17/2019. Sed rate was 2 and CRP was 1 on 11/14/2019.  At home hemoccult test was positive on 10/25/2019.  The patient received the [place] COVID-19 vaccine on 05/02/2019 and 05/23/2019.  Symptomatically, he feels "ok." He denies any bleeding except for nose bleeds.  He is "sort of" eating ok.  He denies any new medications or herbal products besides Carafate. Exam reveals no appreciable hepatosplenomegaly.  Plan: 1.   Labs today: CBC with diff, CMP, ferritin, iron studies, B12,  folate, copper. 2.   Low grade myelodysplastic syndrome  Patient presented with leukopenia and thrombocytopenia.  Bone marrow on 12/16/2020 revealed features suggestive of a low grade myelodysplastic syndrome.   FISH studies were inadvertantly not done.   IPSS sore is low.  Hematocrit 44.9.  Hemoglobin 14.5.  MCV 85.0.  Platelets 81,000.  WBC 4300 with an [place] of 2400 on 12/17/2019.  Hematocrit 39.1.  Hemoglobin 12.4.  MCV 81.8.  Platelets 58,000.  WBC 2200 with an [place] of 1100 on 02/21/2020.  Discuss initiation plan for observation then consideration of a hypomethylating agent, Vidaza, based on counts.  Discuss concern that his low counts may in part be secondary to cirrhosis and splenomegaly.   Given significant drop in counts, plan for additional labs today.  Discuss issues with myelosuppression and Vidaza and increases risk of bleeding (thrombocytopenia) with esophageal varices.  Discuss referral for second opinion at [facility].  Patient in agreement. 3.   Iron deficiency  [place] card was + on 10/25/2019. He notes a history of abdominal discomfort, appetite loss, and a 40 pound weight loss (weight up 6 pounds since last visit).  EGD on  01/22/2020 revealed grade II esophageal varices, gastritis, and portal hypertensive gastropathy.   Colonoscopy revealed 4 polyps (3 tubular adenomas, one hyperplastic polyp). Ferritin 23 with an iron saturation of 7% and a TIBC of 482 today (available after clinic). 4.   Consult [facility] ([facility])- Dr Gloriann Loan contacted. 5.   RTC in 1 month for MD assessment, labs (CBC with diff) and discussion regarding direction of therapy..  Addendum:  The patient was contacted regarding his iron deficiency.  Ferrous sulfate 325 mg po q day initially then increase to BID with OJ or vitamin C was suggested.  If he does not tolerate oral iron, he will be treated with IV iron (preauth Venofer).  I discussed the assessment and treatment  plan with the patient.  The patient was provided an opportunity to ask questions and all were answered.  The patient agreed with the plan and demonstrated an understanding of the instructions.  The patient was advised to call back if the symptoms worsen or if the condition fails to improve as anticipated.  I provided 23 minutes of face-to-face time during this this encounter and > 50% was spent counseling as documented under my assessment and plan.  An additional 10-12 minutes were spent reviewing his chart (Epic and Care Everywhere) including notes, labs, and imaging studies and reaching out to Dr Gloriann Loan.    Juan Garza C. Mike Gip, MD, PhD    02/21/2020, 9:50 AM  Carleene Cooper , am acting as Education administrator for [employer]. Mike Gip, MD, PhD.  I, Jaze Rodino C. Mike Gip, MD, have reviewed the above documentation for accuracy and completeness, and I agree with the above.

## 2020-02-21 ENCOUNTER — Telehealth: Payer: Self-pay

## 2020-02-21 ENCOUNTER — Other Ambulatory Visit: Payer: Self-pay

## 2020-02-21 ENCOUNTER — Inpatient Hospital Stay: Payer: Commercial Managed Care - PPO

## 2020-02-21 ENCOUNTER — Inpatient Hospital Stay: Payer: Commercial Managed Care - PPO | Attending: Hematology and Oncology | Admitting: Hematology and Oncology

## 2020-02-21 ENCOUNTER — Encounter: Payer: Self-pay | Admitting: Hematology and Oncology

## 2020-02-21 VITALS — BP 120/67 | HR 67 | Temp 97.8°F | Resp 20 | Wt 266.9 lb

## 2020-02-21 DIAGNOSIS — Z79899 Other long term (current) drug therapy: Secondary | ICD-10-CM | POA: Diagnosis not present

## 2020-02-21 DIAGNOSIS — R161 Splenomegaly, not elsewhere classified: Secondary | ICD-10-CM | POA: Diagnosis not present

## 2020-02-21 DIAGNOSIS — D509 Iron deficiency anemia, unspecified: Secondary | ICD-10-CM | POA: Diagnosis not present

## 2020-02-21 DIAGNOSIS — D124 Benign neoplasm of descending colon: Secondary | ICD-10-CM | POA: Diagnosis not present

## 2020-02-21 DIAGNOSIS — K7460 Unspecified cirrhosis of liver: Secondary | ICD-10-CM | POA: Diagnosis not present

## 2020-02-21 DIAGNOSIS — K760 Fatty (change of) liver, not elsewhere classified: Secondary | ICD-10-CM | POA: Diagnosis not present

## 2020-02-21 DIAGNOSIS — E611 Iron deficiency: Secondary | ICD-10-CM | POA: Diagnosis not present

## 2020-02-21 DIAGNOSIS — K8020 Calculus of gallbladder without cholecystitis without obstruction: Secondary | ICD-10-CM | POA: Diagnosis not present

## 2020-02-21 DIAGNOSIS — D469 Myelodysplastic syndrome, unspecified: Secondary | ICD-10-CM | POA: Diagnosis present

## 2020-02-21 DIAGNOSIS — D696 Thrombocytopenia, unspecified: Secondary | ICD-10-CM

## 2020-02-21 DIAGNOSIS — E119 Type 2 diabetes mellitus without complications: Secondary | ICD-10-CM | POA: Insufficient documentation

## 2020-02-21 DIAGNOSIS — R110 Nausea: Secondary | ICD-10-CM | POA: Diagnosis not present

## 2020-02-21 DIAGNOSIS — Z7984 Long term (current) use of oral hypoglycemic drugs: Secondary | ICD-10-CM | POA: Diagnosis not present

## 2020-02-21 DIAGNOSIS — Z87891 Personal history of nicotine dependence: Secondary | ICD-10-CM | POA: Diagnosis not present

## 2020-02-21 LAB — CBC WITH DIFFERENTIAL/PLATELET
Abs Immature Granulocytes: 0 10*3/uL (ref 0.00–0.07)
Basophils Absolute: 0 10*3/uL (ref 0.0–0.1)
Basophils Relative: 1 %
Eosinophils Absolute: 0.1 10*3/uL (ref 0.0–0.5)
Eosinophils Relative: 3 %
HCT: 39.1 % (ref 39.0–52.0)
Hemoglobin: 12.4 g/dL — ABNORMAL LOW (ref 13.0–17.0)
Immature Granulocytes: 0 %
Lymphocytes Relative: 30 %
Lymphs Abs: 0.7 10*3/uL (ref 0.7–4.0)
MCH: 25.9 pg — ABNORMAL LOW (ref 26.0–34.0)
MCHC: 31.7 g/dL (ref 30.0–36.0)
MCV: 81.8 fL (ref 80.0–100.0)
Monocytes Absolute: 0.3 10*3/uL (ref 0.1–1.0)
Monocytes Relative: 14 %
Neutro Abs: 1.1 10*3/uL — ABNORMAL LOW (ref 1.7–7.7)
Neutrophils Relative %: 52 %
Platelets: 58 10*3/uL — ABNORMAL LOW (ref 150–400)
RBC: 4.78 MIL/uL (ref 4.22–5.81)
RDW: 15.8 % — ABNORMAL HIGH (ref 11.5–15.5)
WBC: 2.2 10*3/uL — ABNORMAL LOW (ref 4.0–10.5)
nRBC: 0 % (ref 0.0–0.2)

## 2020-02-21 LAB — IRON AND TIBC
Iron: 32 ug/dL — ABNORMAL LOW (ref 45–182)
Saturation Ratios: 7 % — ABNORMAL LOW (ref 17.9–39.5)
TIBC: 482 ug/dL — ABNORMAL HIGH (ref 250–450)
UIBC: 450 ug/dL

## 2020-02-21 LAB — COMPREHENSIVE METABOLIC PANEL
ALT: 35 U/L (ref 0–44)
AST: 47 U/L — ABNORMAL HIGH (ref 15–41)
Albumin: 3.9 g/dL (ref 3.5–5.0)
Alkaline Phosphatase: 65 U/L (ref 38–126)
Anion gap: 2 — ABNORMAL LOW (ref 5–15)
BUN: 14 mg/dL (ref 6–20)
CO2: 34 mmol/L — ABNORMAL HIGH (ref 22–32)
Calcium: 9 mg/dL (ref 8.9–10.3)
Chloride: 100 mmol/L (ref 98–111)
Creatinine, Ser: 0.72 mg/dL (ref 0.61–1.24)
GFR, Estimated: >60 mL/min (ref >60)
Glucose, Bld: 155 mg/dL — ABNORMAL HIGH (ref 70–99)
Potassium: 4.3 mmol/L (ref 3.5–5.1)
Sodium: 136 mmol/L (ref 135–145)
Total Bilirubin: 0.9 mg/dL (ref 0.3–1.2)
Total Protein: 6.3 g/dL — ABNORMAL LOW (ref 6.5–8.1)

## 2020-02-21 LAB — FERRITIN: Ferritin: 23 ng/mL — ABNORMAL LOW (ref 24–336)

## 2020-02-21 LAB — IMMATURE PLATELET FRACTION: Immature Platelet Fraction: 8.2 % (ref 1.2–8.6)

## 2020-02-21 LAB — VITAMIN B12: Vitamin B-12: 841 pg/mL (ref 180–914)

## 2020-02-21 LAB — TSH: TSH: 2.249 u[IU]/mL (ref 0.350–4.500)

## 2020-02-21 LAB — FOLATE: Folate: 6.8 ng/mL (ref >5.9)

## 2020-02-21 NOTE — Patient Instructions (Signed)
Decitabine injection for infusion What is this medicine? DECITABINE (dee SYE ta been) is a chemotherapy drug. This medicine reduces the growth of cancer cells. It is used to treat adults with myelodysplastic syndromes. This medicine may be used for other purposes; ask your health care provider or pharmacist if you have questions. COMMON BRAND NAME(S): Dacogen What should I tell my health care provider before I take this medicine? They need to know if you have any of these conditions:  infection (especially a virus infection such as chickenpox, cold sores, or herpes)  kidney disease  liver disease  an unusual or allergic reaction to decitabine, other medicines, foods, dyes, or preservatives  pregnant or trying to get pregnant  breast-feeding How should I use this medicine? This medicine is for infusion into a vein. It is administered in a hospital or clinic by a doctor or health care professional. Talk to your pediatrician regarding the use of this medicine in children. Special care may be needed. Overdosage: If you think you have taken too much of this medicine contact a poison control center or emergency room at once. NOTE: This medicine is only for you. Do not share this medicine with others. What if I miss a dose? It is important not to miss your dose. Call your doctor or health care professional if you are unable to keep an appointment. What may interact with this medicine?  vaccines Talk to your doctor or health care professional before taking any of these medicines:  aspirin  acetaminophen  ibuprofen  ketoprofen  naproxen This list may not describe all possible interactions. Give your health care provider a list of all the medicines, herbs, non-prescription drugs, or dietary supplements you use. Also tell them if you smoke, drink alcohol, or use illegal drugs. Some items may interact with your medicine. What should I watch for while using this medicine? Visit your  doctor for checks on your progress. This drug may make you feel generally unwell. This is not uncommon, as chemotherapy can affect healthy cells as well as cancer cells. Report any side effects. Continue your course of treatment even though you feel ill unless your doctor tells you to stop. You may need blood work done while you are taking this medicine. In some cases, you may be given additional medicines to help with side effects. Follow all directions for their use. Call your doctor or health care professional for advice if you get a fever, chills or sore throat, or other symptoms of a cold or flu. Do not treat yourself. This drug decreases your body's ability to fight infections. Try to avoid being around people who are sick. This medicine may increase your risk to bruise or bleed. Call your doctor or health care professional if you notice any unusual bleeding. Do not become pregnant while taking this medicine or for 6 months after stopping it. Women should inform their doctor if they wish to become pregnant or think they might be pregnant. Men should not father a child while taking this medicine and for 3 months after stopping it. There is a potential for serious side effects to an unborn child. Talk to your health care professional or pharmacist for more information. Do not breast-feed an infant while taking this medicine or for at least 2 weeks after stopping it. In males, this medicine may interfere with the ability to father a child. Talk with your doctor or health care professional if you are concerned about your fertility. What side effects may  I notice from receiving this medicine? Side effects that you should report to your doctor or health care professional as soon as possible:  low blood counts - this medicine may decrease the number of white blood cells, red blood cells and platelets. You may be at increased risk for infections and bleeding.  signs of infection - fever or chills, cough,  sore throat, pain or difficulty passing urine  signs of decreased platelets or bleeding - bruising, pinpoint red spots on the skin, black, tarry stools, blood in the urine  signs of decreased red blood cells - unusual weakness or tiredness, fainting spells, lightheadedness  increased blood sugar Side effects that usually do not require medical attention (report to your doctor or health care professional if they continue or are bothersome):  constipation  diarrhea  headache  loss of appetite  nausea, vomiting  skin rash, itching  stomach pain  water retention  weak or tired This list may not describe all possible side effects. Call your doctor for medical advice about side effects. You may report side effects to FDA at 1-800-FDA-1088. Where should I keep my medicine? This drug is given in a hospital or clinic and will not be stored at home. NOTE: This sheet is a summary. It may not cover all possible information. If you have questions about this medicine, talk to your doctor, pharmacist, or health care provider.  2021 [employer] Standard (2018-04-07 13:32:17)   Azacitidine suspension for injection (subcutaneous use) What is this medicine? AZACITIDINE (ay [place]) is a chemotherapy drug. This medicine reduces the growth of cancer cells and can suppress the immune system. It is used for treating myelodysplastic syndrome or some types of leukemia. This medicine may be used for other purposes; ask your health care provider or pharmacist if you have questions. COMMON BRAND NAME(S): Vidaza What should I tell my health care provider before I take this medicine? They need to know if you have any of these conditions:  kidney disease  liver disease  liver tumors  an unusual or allergic reaction to azacitidine, mannitol, other medicines, foods, dyes, or preservatives  pregnant or trying to get pregnant  breast-feeding How should I use this medicine? This medicine  is for injection under the skin. It is administered in a hospital or clinic by a specially trained health care professional. Talk to your pediatrician regarding the use of this medicine in children. While this drug may be prescribed for selected conditions, precautions do apply. Overdosage: If you think you have taken too much of this medicine contact a poison control center or emergency room at once. NOTE: This medicine is only for you. Do not share this medicine with others. What if I miss a dose? It is important not to miss your dose. Call your doctor or health care professional if you are unable to keep an appointment. What may interact with this medicine? Interactions have not been studied. Give your health care provider a list of all the medicines, herbs, non-prescription drugs, or dietary supplements you use. Also tell them if you smoke, drink alcohol, or use illegal drugs. Some items may interact with your medicine. This list may not describe all possible interactions. Give your health care provider a list of all the medicines, herbs, non-prescription drugs, or dietary supplements you use. Also tell them if you smoke, drink alcohol, or use illegal drugs. Some items may interact with your medicine. What should I watch for while using this medicine? Visit your doctor for  checks on your progress. This drug may make you feel generally unwell. This is not uncommon, as chemotherapy can affect healthy cells as well as cancer cells. Report any side effects. Continue your course of treatment even though you feel ill unless your doctor tells you to stop. In some cases, you may be given additional medicines to help with side effects. Follow all directions for their use. Call your doctor or health care professional for advice if you get a fever, chills or sore throat, or other symptoms of a cold or flu. Do not treat yourself. This drug decreases your body's ability to fight infections. Try to avoid being  around people who are sick. This medicine may increase your risk to bruise or bleed. Call your doctor or health care professional if you notice any unusual bleeding. You may need blood work done while you are taking this medicine. Do not become pregnant while taking this medicine and for 6 months after the last dose. Women should inform their doctor if they wish to become pregnant or think they might be pregnant. Men should not father a child while taking this medicine and for 3 months after the last dose. There is a potential for serious side effects to an unborn child. Talk to your health care professional or pharmacist for more information. Do not breast-feed an infant while taking this medicine and for 1 week after the last dose. This medicine may interfere with the ability to have a child. Talk with your doctor or health care professional if you are concerned about your fertility. What side effects may I notice from receiving this medicine? Side effects that you should report to your doctor or health care professional as soon as possible:  allergic reactions like skin rash, itching or hives, swelling of the face, lips, or tongue  low blood counts - this medicine may decrease the number of white blood cells, red blood cells and platelets. You may be at increased risk for infections and bleeding.  signs of infection - fever or chills, cough, sore throat, pain passing urine  signs of decreased platelets or bleeding - bruising, pinpoint red spots on the skin, black, tarry stools, blood in the urine  signs of decreased red blood cells - unusually weak or tired, fainting spells, lightheadedness  signs and symptoms of kidney injury like trouble passing urine or change in the amount of urine  signs and symptoms of liver injury like dark yellow or brown urine; general ill feeling or flu-like symptoms; light-colored stools; loss of appetite; nausea; right upper belly pain; unusually weak or tired;  yellowing of the eyes or skin Side effects that usually do not require medical attention (report to your doctor or health care professional if they continue or are bothersome):  constipation  diarrhea  nausea, vomiting  pain or redness at the injection site  unusually weak or tired This list may not describe all possible side effects. Call your doctor for medical advice about side effects. You may report side effects to FDA at 1-800-FDA-1088. Where should I keep my medicine? This drug is given in a hospital or clinic and will not be stored at home. NOTE: This sheet is a summary. It may not cover all possible information. If you have questions about this medicine, talk to your doctor, pharmacist, or health care provider.  2021 [employer] Standard (2016-02-24 14:37:51)

## 2020-02-21 NOTE — Telephone Encounter (Signed)
Patient did not answer and I left a voicemail for patient informing him to start ferrous sulfate 325 mg po q daily with Orange juice or Vitamin c. Increase 1 tablet BID IF TOLERATED.  If oral iron is poorly tolerated, we can begin IV iron

## 2020-02-22 DIAGNOSIS — D469 Myelodysplastic syndrome, unspecified: Principal | ICD-10-CM

## 2020-02-22 DIAGNOSIS — K7460 Unspecified cirrhosis of liver: Secondary | ICD-10-CM | POA: Insufficient documentation

## 2020-02-23 LAB — COPPER, SERUM: Copper: 91 ug/dL (ref 69–132)

## 2020-02-26 ENCOUNTER — Ambulatory Visit: Admit: 2020-02-26 | Discharge: 2020-02-27 | Payer: PRIVATE HEALTH INSURANCE

## 2020-02-26 DIAGNOSIS — D469 Myelodysplastic syndrome, unspecified: Principal | ICD-10-CM

## 2020-02-27 ENCOUNTER — Encounter
Admit: 2020-02-27 | Discharge: 2020-02-27 | Payer: PRIVATE HEALTH INSURANCE | Attending: Internal Medicine | Primary: Internal Medicine

## 2020-02-27 DIAGNOSIS — D469 Myelodysplastic syndrome, unspecified: Principal | ICD-10-CM

## 2020-03-04 DIAGNOSIS — D469 Myelodysplastic syndrome, unspecified: Principal | ICD-10-CM

## 2020-03-05 ENCOUNTER — Ambulatory Visit
Admit: 2020-03-05 | Discharge: 2020-03-06 | Payer: PRIVATE HEALTH INSURANCE | Attending: Internal Medicine | Primary: Internal Medicine

## 2020-03-05 ENCOUNTER — Other Ambulatory Visit: Admit: 2020-03-05 | Discharge: 2020-03-06 | Payer: PRIVATE HEALTH INSURANCE

## 2020-03-05 DIAGNOSIS — D469 Myelodysplastic syndrome, unspecified: Principal | ICD-10-CM

## 2020-03-20 NOTE — Progress Notes (Incomplete)
[facility]  [address], [address] [place],  [address] Phone: [phone]  Fax: [phone]   Clinic Day: 03/20/20  Referring physician: Ezequiel Kayser, MD  Chief Complaint: Juan Garza is a 61 y.o. male with iron deficiency anemia, thrombocytopenia, portal hypertension, and a possible low grade myelodysplastic syndrome who is seen for 1 month assessment and discussion regarding direction of therapy.  HPI: The patient was last seen in the hematology clinic on 02/21/2019. At that time, he felt "ok." He denied any bleeding except for nose bleeds.  He was "sort of" eating ok.  He denied any new medications or herbal products besides Carafate. Exam revealed no appreciable hepatosplenomegaly. Hematocrit was 39.1, hemoglobin 12.4, platelets 58,000, WBC 2,200 (ANC 1,100). Immature platelet fraction was 8.2%. Ferritin was 23 with an iron saturation of 7% and a TIBC of 482. Vitamin B12 was 841 and folate 6.8. TSH was 2.249. Copper was 91. Patient was contacted and a message was left to begin oral iron once daily and increase to BID if tolerated.  We discussed a [facility] hematology consult for second opinion.  The patient was seen by Dr Gloriann Loan on 03/05/2020.  Bone marrow review did not reveal sufficient dysplasia to meet diagnostic criteria for a myelodysplastic syndrome.  The minimal dysplasia, leukopenia and thrombocytopenia in the marrow may be the result of reactive changes including portal hypertension and iron deficiency.  Early low-grade myelodysplastic syndrome could not be ruled out.  A repeat bone marrow was recommended if he developed grade 3 neutropenia (ANC < 1000) or thrombocytopenia (platelets < 50,000).  During the interim, ***   Past Medical History:  Diagnosis Date  . Chronic back pain   . Diabetes mellitus without complication [facility])     Past Surgical History:  Procedure Laterality Date  . ABDOMINAL SURGERY    . APPENDECTOMY    . BACK  SURGERY     posteriior lumbar spine fusion  . CARDIAC CATHETERIZATION    . CARDIAC VALVE REPLACEMENT    . colectomy partial w/anastoamosis    . COLONOSCOPY WITH PROPOFOL N/A 01/22/2020   Procedure: COLONOSCOPY WITH PROPOFOL;  Surgeon: Lesly Rubenstein, MD;  Location: [facility] ENDOSCOPY;  Service: Endoscopy;  Laterality: N/A;  . ESOPHAGOGASTRODUODENOSCOPY (EGD) WITH PROPOFOL N/A 01/22/2020   Procedure: ESOPHAGOGASTRODUODENOSCOPY (EGD) WITH PROPOFOL;  Surgeon: Lesly Rubenstein, MD;  Location: [facility] ENDOSCOPY;  Service: Endoscopy;  Laterality: N/A;  . HERNIA REPAIR    . JOINT REPLACEMENT    . KNEE SURGERY Left   . NASAL SINUS SURGERY      Family History  Problem Relation Age of Onset  . Atrial fibrillation Mother   . Diabetes Father     Social History:  reports that he has quit smoking. He has never used smokeless tobacco. He reports current alcohol use. He reports that he does not use drugs. He smoked 1.5 packs per day for ~20 years and quit in 1996. He denies drug use. He drinks 3 drinks weekly, usually rum and coke. He worked at a [employer] in his early 13s. He lives in [place]. 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 The patient is alone ***today.  Allergies:  Allergies  Allergen Reactions  . Imitrex [Sumatriptan] Other (See Comments)    shaking  . Seroquel [Quetiapine Fumarate] Other (See Comments)    Leg spasms  . Adhesive [Tape] Rash    Current Medications: Current Outpatient Medications  Medication Sig Dispense Refill  . acetaminophen (TYLENOL) 500 MG tablet  Take 500 mg by mouth every 6 (six) hours as needed.    Marland Kitchen atorvastatin (LIPITOR) 10 MG tablet Take 10 mg by mouth daily.    Marland Kitchen dutasteride (AVODART) 0.5 MG capsule Take by mouth.    . empagliflozin (JARDIANCE) 10 MG TABS tablet Take 10 mg by mouth daily.    . eszopiclone (LUNESTA) 2 MG TABS tablet Take by mouth.    Marland Kitchen glipiZIDE (GLUCOTROL) 10 MG tablet Take 10 mg by mouth daily before breakfast.     . lisinopril  (ZESTRIL) 10 MG tablet Take 10 mg by mouth daily.    . naproxen (NAPROSYN) 500 MG tablet Take 1 tablet (500 mg total) by mouth 2 (two) times daily. 20 tablet 0  . omeprazole (PRILOSEC) 40 MG capsule Take 40 mg by mouth 2 (two) times daily.     . pregabalin (LYRICA) 75 MG capsule Take by mouth.    . propranolol (INDERAL) 20 MG tablet Take 20 mg by mouth 2 (two) times daily.    . Semaglutide,0.25 or 0.5MG /DOS, (OZEMPIC, 0.25 OR 0.5 MG/DOSE,) 2 MG/1.5ML SOPN Inject into the skin.     No current facility-administered medications for this visit.    Review of Systems  Constitutional: Negative for chills, diaphoresis, fever, malaise/fatigue and weight loss (up 6 lbs).       Feels "ok."  HENT: Positive for nosebleeds (occasional). Negative for congestion, ear discharge, ear pain, hearing loss, sinus pain, sore throat and tinnitus.   Eyes: Negative for blurred vision and double vision.  Respiratory: Negative for cough, hemoptysis, sputum production and shortness of breath.   Cardiovascular: Negative for chest pain, palpitations and leg swelling.  Gastrointestinal: Positive for abdominal pain (comes and goes), heartburn (reflux) and nausea (improved). Negative for blood in stool, constipation, diarrhea, melena and vomiting.       Eating well.  Genitourinary: Negative for dysuria, frequency, hematuria and urgency.  Musculoskeletal: Positive for joint pain (hips) and myalgias (left second finger). Negative for back pain, falls and neck pain.  Skin: Negative for itching and rash.  Neurological: Negative for dizziness, tingling, sensory change, weakness and headaches.  Endo/Heme/Allergies: Bruises/bleeds easily (easy bruising).  Psychiatric/Behavioral: Negative for depression and memory loss. The patient is not nervous/anxious and does not have insomnia.   All other systems reviewed and are negative.  Performance status (ECOG): 1***  Vitals There were no vitals taken for this visit.   Physical  Exam Vitals and nursing note reviewed.  Constitutional:      General: He is not in acute distress.    Appearance: He is not diaphoretic.  HENT:     Head: Normocephalic and atraumatic.     Comments: Red hair and beard.    Mouth/Throat:     Mouth: Mucous membranes are moist.     Pharynx: Oropharynx is clear.  Eyes:     General: No scleral icterus.    Extraocular Movements: Extraocular movements intact.     Conjunctiva/sclera: Conjunctivae normal.     Pupils: Pupils are equal, round, and reactive to light.     Comments: Blue eyes.  Cardiovascular:     Rate and Rhythm: Normal rate and regular rhythm.     Heart sounds: Normal heart sounds. No murmur heard.   Pulmonary:     Effort: Pulmonary effort is normal. No respiratory distress.     Breath sounds: Normal breath sounds. No wheezing or rales.  Chest:     Chest wall: No tenderness.  Breasts:     Right: No  axillary adenopathy or supraclavicular adenopathy.     Left: No axillary adenopathy or supraclavicular adenopathy.    Abdominal:     General: Bowel sounds are normal. There is no distension.     Palpations: Abdomen is soft. There is no mass.     Tenderness: There is no abdominal tenderness. There is no guarding or rebound.  Musculoskeletal:        General: No swelling or tenderness. Normal range of motion.     Cervical back: Normal range of motion and neck supple.  Lymphadenopathy:     Head:     Right side of head: No preauricular, posterior auricular or occipital adenopathy.     Left side of head: No preauricular, posterior auricular or occipital adenopathy.     Cervical: No cervical adenopathy.     Upper Body:     Right upper body: No supraclavicular or axillary adenopathy.     Left upper body: No supraclavicular or axillary adenopathy.     Lower Body: No right inguinal adenopathy. No left inguinal adenopathy.  Skin:    General: Skin is warm and dry.     Comments: Lower abdominal bruising.  Neurological:     Mental  Status: He is alert and oriented to person, place, and time.  Psychiatric:        Behavior: Behavior normal.        Thought Content: Thought content normal.        Judgment: Judgment normal.     No visits with results within 3 Day(s) from this visit.  Latest known visit with results is:  Appointment on 02/21/2020  Component Date Value Ref Range Status  . Iron 02/21/2020 32* 45 - 182 ug/dL Final  . TIBC 02/21/2020 482* 250 - 450 ug/dL Final  . Saturation Ratios 02/21/2020 7* 17.9 - 39.5 % Final  . UIBC 02/21/2020 450  ug/dL Final   Performed at [facility], [address], [place], [place] [address]  . Ferritin 02/21/2020 23* 24 - 336 ng/mL Final   Performed at [facility], [address] [place], [place] [address]  . Sodium 02/21/2020 136  135 - 145 mmol/L Final  . Potassium 02/21/2020 4.3  3.5 - 5.1 mmol/L Final  . Chloride 02/21/2020 100  98 - 111 mmol/L Final  . CO2 02/21/2020 34* 22 - 32 mmol/L Final  . Glucose, Bld 02/21/2020 155* 70 - 99 mg/dL Final   Glucose reference range applies only to samples taken after fasting for at least 8 hours.  . BUN 02/21/2020 14  6 - 20 mg/dL Final  . Creatinine, Ser 02/21/2020 0.72  0.61 - 1.24 mg/dL Final  . Calcium 02/21/2020 9.0  8.9 - 10.3 mg/dL Final  . Total Protein 02/21/2020 6.3* 6.5 - 8.1 g/dL Final  . Albumin 02/21/2020 3.9  3.5 - 5.0 g/dL Final  . AST 02/21/2020 47* 15 - 41 U/L Final  . ALT 02/21/2020 35  0 - 44 U/L Final  . Alkaline Phosphatase 02/21/2020 65  38 - 126 U/L Final  . Total Bilirubin 02/21/2020 0.9  0.3 - 1.2 mg/dL Final  . GFR, Estimated 02/21/2020 >60  >60 mL/min Final   Comment: (NOTE) Calculated using the CKD-EPI Creatinine Equation (2021)   . Anion gap 02/21/2020 2* 5 - 15 Final   Performed at [facility], [address], [place], [place] [address]  . WBC 02/21/2020 2.2* 4.0 - 10.5 K/uL Final  . RBC 02/21/2020 4.78  4.22 - 5.81  MIL/uL Final  . Hemoglobin 02/21/2020 12.4*  13.0 - 17.0 g/dL Final  . HCT 02/21/2020 39.1  39.0 - 52.0 % Final  . MCV 02/21/2020 81.8  80.0 - 100.0 fL Final  . MCH 02/21/2020 25.9* 26.0 - 34.0 pg Final  . MCHC 02/21/2020 31.7  30.0 - 36.0 g/dL Final  . RDW 02/21/2020 15.8* 11.5 - 15.5 % Final  . Platelets 02/21/2020 58* 150 - 400 K/uL Final   Comment: Immature Platelet Fraction may be clinically indicated, consider ordering this additional test [id number]   . nRBC 02/21/2020 0.0  0.0 - 0.2 % Final  . Neutrophils Relative % 02/21/2020 52  % Final  . Neutro Abs 02/21/2020 1.1* 1.7 - 7.7 K/uL Final  . Lymphocytes Relative 02/21/2020 30  % Final  . Lymphs Abs 02/21/2020 0.7  0.7 - 4.0 K/uL Final  . Monocytes Relative 02/21/2020 14  % Final  . Monocytes Absolute 02/21/2020 0.3  0.1 - 1.0 K/uL Final  . Eosinophils Relative 02/21/2020 3  % Final  . Eosinophils Absolute 02/21/2020 0.1  0.0 - 0.5 K/uL Final  . Basophils Relative 02/21/2020 1  % Final  . Basophils Absolute 02/21/2020 0.0  0.0 - 0.1 K/uL Final  . Immature Granulocytes 02/21/2020 0  % Final  . Abs Immature Granulocytes 02/21/2020 0.00  0.00 - 0.07 K/uL Final   Performed at [facility], [address], [place], [place] [address]  . TSH 02/21/2020 2.249  0.350 - 4.500 uIU/mL Final   Comment: Performed by a 3rd Generation assay with a functional sensitivity of <=0.01 uIU/mL. Performed at [facility], [address], [place], [place] [address]   . Copper 02/21/2020 91  69 - 132 ug/dL Final   Comment: (NOTE) This test was developed and its performance characteristics determined by [facility]. It has not been cleared or approved by the Food and Drug Administration.                                Detection Limit = 5 Performed At: [facility] [address] [place], [place] [id number] Rush Farmer MD [id number]   . Folate 02/21/2020 6.8  >5.9 ng/mL Final   Performed at [facility], [address] [place], [place] [address]  .  Vitamin B-12 02/21/2020 841  180 - 914 pg/mL Final   Comment: (NOTE) This assay is not validated for testing neonatal or myeloproliferative syndrome specimens for Vitamin B12 levels. Performed at [facility], [place] [address], [place], [place] [address]   . Immature Platelet Fraction 02/21/2020 8.2  1.2 - 8.6 % Final   Performed at [facility], [address] [place], [place] [address]     Assessment:  Juan Garza is a 60 y.o. 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  He presented with leukopenia and thrombocytopenia.  Bone marrow on 12/17/2019 revealed a hypercellular marrow with dysmegakaryocytopoiesis. The features were suggestive of a low-grade myelodysplastic syndrome. Cytogenetics were normal (46, XY).  IPSS score 0 (low) and IPSS-R (0.5).  CBC on 11/14/2019 revealed a hematocrit 43.3, hemoglobin 14.2, MCV 85.9, platelets 77,000, WBC 3,600 (ANC 2,000).  SPEP was normal on 10/17/2019. Vitamin B12 was 838 on 10/17/2019.  TSH and free T4 were normal on 04/16/2019.  Work-up on 11/27/2019 revealed a hematocrit of 47.5, hemoglobin 15.3, platelets 82,000, WBC 5,000. Platelet count in a citrate was 81,400.  AST was 45, ALT 45,  and bilirubin 1.8. Normal labs included PT (31), PT (14.1), LDH (176),  copper (78), TSH (1.579), free T4 (0.70), folate (6.8), ANA, H pylori antibody, HIV antibody, hepatitis B core antibody, and hepatitis C antibody were negative.  Immature platelet fraction was 7.9% (normal) on 12/11/2019.   Peripheral smear revealed mild thrombocytopenia without clumping, which has been persistent. RBCs were normochromic, with mild anisocytosis. There was no poikilocytosis or fragmentation.  There were no abnormal or immature leukocytes were identified.  Abdominal ultrasound on 12/07/2019 revealed diffusely increased echotexture throughout the liver c/w fatty infiltration. Portal vein was patent on Doppler with normal direction of blood flow toward the liver.   There was splenomegaly (13.8 cm). There was cholelithiasis. There was no evidence of acute cholecystitis.  Abdomen and pelvis CT on 01/02/2020 revealed morphologic changes to the liver raising the possibility of cirrhosis. There were sequelae of portal venous hypertension including splenomegaly (15.5 cm) and upper abdominal collateral vessels. There was cholelithiasis. There was mild gallbladder wall thickening which may be secondary to portal venous hypertension/cirrhosis.  AFP was 3.4 on 12/19/2019.  EGD on 01/22/2020 revealed grade II esophageal varices, gastritis, and portal hypertensive gastropathy. Colonoscopy showed one 2 mm polyp in the transverse colon, one 3 mm polyp in the transverse colon, and two 3 to 7 mm polyps in the descending colon (3 tubular adenomas, one hyperplastic polyp).  He has iron deficiency.  He has a history of upper GI bleed felt related to ibuprofen in 06/2019. Ferritin was 9 on 05/28/2019 and 19 on 10/17/2019. Iron saturation was 6% and TIBC was 467 on 05/28/2019. Reticulocyte count was 1.34% on 10/17/2019. Sed rate was 2 and CRP was 1 on 11/14/2019.  At home hemoccult test was positive on 10/25/2019.  The patient received the [place] COVID-19 vaccine on 05/02/2019 and 05/23/2019.  Symptomatically, ***  Plan: 1.   Labs today: CBC with diff, ferritin, iron studies.   2.   Low grade myelodysplastic syndrome  Patient presented with leukopenia and thrombocytopenia.  Bone marrow on 12/16/2020 revealed features suggestive of a low grade myelodysplastic syndrome.   FISH studies were inadvertantly not done.   IPSS sore is low.  Hematocrit 44.9.  Hemoglobin 14.5.  MCV 85.0.  Platelets 81,000.  WBC 4300 with an [place] of 2400 on 12/17/2019.  Hematocrit 39.1.  Hemoglobin 12.4.  MCV 81.8.  Platelets 58,000.  WBC 2200 with an [place] of 1100 on 02/21/2020.  Discuss initiation plan for observation then consideration of a hypomethylating agent, Vidaza, based on counts.  Discuss  concern that his low counts may in part be secondary to cirrhosis and splenomegaly.   Given significant drop in counts, plan for additional labs today.  Discuss issues with myelosuppression and Vidaza and increases risk of bleeding (thrombocytopenia) with esophageal varices.  Discuss referral for second opinion at [facility].  Patient in agreement. 3.   Iron deficiency  [place] card was + on 10/25/2019. He notes a history of abdominal discomfort, appetite loss, and a 40 pound weight loss (weight up 6 pounds since last visit).  EGD on 01/22/2020 revealed grade II esophageal varices, gastritis, and portal hypertensive gastropathy.   Colonoscopy revealed 4 polyps (3 tubular adenomas, one hyperplastic polyp). Ferritin 23 with an iron saturation of 7% and a TIBC of 482 today (available after clinic). 4.   Portal hypertension   Abdomen and pelvis CT on 01/02/2020 revealed morphologic changes to the liver suggestive of cirrhosis.    There were sequelae of portal venous hypertension including splenomegaly (15.5  cm) and upper abdominal collateral vessels.   Follow-up with GI.  5.   Consult [facility] ([facility])- Dr Gloriann Loan contacted. 5.   RTC in 1 month for MD assessment, labs (CBC with diff) and discussion regarding direction of therapy.  Addendum:  The patient was contacted regarding his iron deficiency.  Ferrous sulfate 325 mg po q day initially then increase to BID with OJ or vitamin C was suggested.  If he does not tolerate oral iron, he will be treated with IV iron (preauth Venofer).  I discussed the assessment and treatment plan with the patient.  The patient was provided an opportunity to ask questions and all were answered.  The patient agreed with the plan and demonstrated an understanding of the instructions.  The patient was advised to call back if the symptoms worsen or if the condition fails to improve as anticipated.  I provided *** minutes of face-to-face  time during this this encounter and > 50% was spent counseling as documented under my assessment and plan.   Melissa C. Mike Gip, MD, PhD    03/24/2020 , 5:30 AM   I, Mirian Mo Tufford , am acting as Education administrator for [employer]. Mike Gip, MD, PhD.  I, Melissa C. Mike Gip, MD, have reviewed the above documentation for accuracy and completeness, and I agree with the above.

## 2020-03-21 ENCOUNTER — Encounter: Payer: Self-pay | Admitting: Hematology and Oncology

## 2020-03-21 ENCOUNTER — Other Ambulatory Visit: Payer: Self-pay

## 2020-03-21 DIAGNOSIS — D509 Iron deficiency anemia, unspecified: Secondary | ICD-10-CM

## 2020-03-21 NOTE — Progress Notes (Signed)
cbc

## 2020-03-24 ENCOUNTER — Inpatient Hospital Stay: Payer: Commercial Managed Care - PPO | Admitting: Hematology and Oncology

## 2020-03-24 ENCOUNTER — Inpatient Hospital Stay: Payer: Commercial Managed Care - PPO | Attending: Hematology and Oncology

## 2020-03-24 ENCOUNTER — Other Ambulatory Visit: Payer: Self-pay | Admitting: Hematology and Oncology

## 2020-03-24 DIAGNOSIS — E611 Iron deficiency: Secondary | ICD-10-CM | POA: Insufficient documentation

## 2020-03-24 DIAGNOSIS — D509 Iron deficiency anemia, unspecified: Secondary | ICD-10-CM

## 2020-03-24 DIAGNOSIS — D4620 Refractory anemia with excess of blasts, unspecified: Secondary | ICD-10-CM | POA: Insufficient documentation

## 2020-03-26 ENCOUNTER — Other Ambulatory Visit: Payer: Self-pay | Admitting: Hematology and Oncology

## 2020-03-26 DIAGNOSIS — D509 Iron deficiency anemia, unspecified: Secondary | ICD-10-CM

## 2020-03-27 ENCOUNTER — Other Ambulatory Visit: Payer: Commercial Managed Care - PPO

## 2020-03-27 ENCOUNTER — Ambulatory Visit: Payer: Commercial Managed Care - PPO | Admitting: Hematology and Oncology

## 2020-03-27 NOTE — Progress Notes (Signed)
[facility]  [address], [address] [place], [name] [address] Phone: [phone]  Fax: [phone]   Clinic Day: 03/31/20  Referring physician: Ezequiel Kayser, MD  Chief Complaint: Juan Garza is a 60 y.o. male with iron deficiency anemia, thrombocytopenia, portal hypertension, and a possible low grade myelodysplastic syndrome who is seen for 1 month assessment and discussion regarding direction of therapy.  HPI: The patient was last seen in the hematology clinic on 02/21/2020. At that time, he felt "ok." He denied any bleeding except for nose bleeds.  He was "sort of" eating ok.  He denied any new medications or herbal products besides Carafate.  Exam revealed no appreciable hepatosplenomegaly. Hematocrit was 39.1, hemoglobin 12.4, platelets 58,000, WBC 2,200 (ANC 1,100). Immature platelet fraction was 8.2%. Ferritin was 23 with an iron saturation of 7% and a TIBC of 482. Vitamin B12 was 841 and folate 6.8. TSH was 2.249. Copper was 91. Patient was contacted and a message was left to begin oral iron once daily and increase to BID if tolerated.    Bone marrow on 12/16/2020 revealed features suggestive of a low grade myelodysplastic syndrome.  However, low counts were felt in part secondary to cirrhosis and splenomegaly.  We discussed a [facility] hematology consult for second opinion.  The patient was seen by Dr Gloriann Loan on 03/05/2020.  Bone marrow review did not reveal sufficient dysplasia to meet diagnostic criteria for a myelodysplastic syndrome.  The minimal dysplasia, leukopenia and thrombocytopenia in the marrow may be the result of reactive changes including portal hypertension and iron deficiency.  Early low-grade myelodysplastic syndrome could not be ruled out.  A repeat bone marrow was recommended if he developed grade 3 neutropenia (ANC < 1000) or thrombocytopenia (platelets < 50,000).  During the interim, he has been "okay."  He had a nosebleed this morning  on the left side that lasted about 15 minutes. Usually, his nosebleeds are on the left side. He has not had one in several weeks. He bruises easily.  The patient is trying to eat more. He has bad nausea a couple of days per week. He thinks that the nausea is due to his gallstones. He also has RUQ abdominal pain. He takes medication for reflux. His hip and left second finger pain are stable.  He does not take oral iron everyday. His stools fluctuate between normal and diarrhea.   Past Medical History:  Diagnosis Date  . Chronic back pain   . Diabetes mellitus without complication [facility])     Past Surgical History:  Procedure Laterality Date  . ABDOMINAL SURGERY    . APPENDECTOMY    . BACK SURGERY     posteriior lumbar spine fusion  . CARDIAC CATHETERIZATION    . CARDIAC VALVE REPLACEMENT    . colectomy partial w/anastoamosis    . COLONOSCOPY WITH PROPOFOL N/A 01/22/2020   Procedure: COLONOSCOPY WITH PROPOFOL;  Surgeon: Lesly Rubenstein, MD;  Location: [facility] ENDOSCOPY;  Service: Endoscopy;  Laterality: N/A;  . ESOPHAGOGASTRODUODENOSCOPY (EGD) WITH PROPOFOL N/A 01/22/2020   Procedure: ESOPHAGOGASTRODUODENOSCOPY (EGD) WITH PROPOFOL;  Surgeon: Lesly Rubenstein, MD;  Location: [facility] ENDOSCOPY;  Service: Endoscopy;  Laterality: N/A;  . HERNIA REPAIR    . JOINT REPLACEMENT    . KNEE SURGERY Left   . NASAL SINUS SURGERY      Family History  Problem Relation Age of Onset  . Atrial fibrillation Mother   . Diabetes Father     Social History:  reports that he  has quit smoking. He has never used smokeless tobacco. He reports current alcohol use. He reports that he does not use drugs. He smoked 1.5 packs per day for ~20 years and quit in 1996. He denies drug use. He drinks 3 drinks weekly, usually rum and coke. He worked at a [employer] in his early 60s. He lives in [place]. 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 The patient is alone today.  Allergies:  Allergies  Allergen Reactions  .  Imitrex [Sumatriptan] Other (See Comments)    shaking  . Seroquel [Quetiapine Fumarate] Other (See Comments)    Leg spasms  . Adhesive [Tape] Rash    Current Medications: Current Outpatient Medications  Medication Sig Dispense Refill  . acetaminophen (TYLENOL) 500 MG tablet Take 500 mg by mouth every 6 (six) hours as needed.    Marland Kitchen atorvastatin (LIPITOR) 10 MG tablet Take 10 mg by mouth daily.    Marland Kitchen dutasteride (AVODART) 0.5 MG capsule Take by mouth daily.    . empagliflozin (JARDIANCE) 10 MG TABS tablet Take 25 mg by mouth daily.    . eszopiclone (LUNESTA) 2 MG TABS tablet Take by mouth as needed.    Marland Kitchen lisinopril (ZESTRIL) 10 MG tablet Take 10 mg by mouth daily.    . pregabalin (LYRICA) 75 MG capsule Take 75 mg by mouth. Takes 150mg  in am and 75mg  at night    . propranolol (INDERAL) 20 MG tablet Take 20 mg by mouth 2 (two) times daily.    . Semaglutide,0.25 or 0.5MG /DOS, (OZEMPIC, 0.25 OR 0.5 MG/DOSE,) 2 MG/1.5ML SOPN Inject into the skin. Takes every 7 days    . sucralfate (CARAFATE) 1 g tablet Take by mouth 2 (two) times daily.    . naproxen (NAPROSYN) 500 MG tablet Take 1 tablet (500 mg total) by mouth 2 (two) times daily. (Patient not taking: Reported on 03/31/2020) 20 tablet 0  . omeprazole (PRILOSEC) 40 MG capsule Take 40 mg by mouth 2 (two) times daily.  (Patient not taking: Reported on 03/31/2020)     No current facility-administered medications for this visit.     Review of Systems  Constitutional: Positive for weight loss (6 lbs). Negative for chills, diaphoresis, fever and malaise/fatigue.       Feels "ok."  HENT: Positive for nosebleeds (this morning). Negative for congestion, ear discharge, ear pain, hearing loss, sinus pain, sore throat and tinnitus.   Eyes: Negative for blurred vision and double vision.  Respiratory: Negative for cough, hemoptysis, sputum production and shortness of breath.   Cardiovascular: Negative for chest pain, palpitations and leg swelling.   Gastrointestinal: Positive for abdominal pain (comes and goes), diarrhea and nausea (2x per week). Negative for blood in stool, constipation, heartburn (on meds), melena and vomiting.       Trying to eat more on days that he is not nauseous  Genitourinary: Negative for dysuria, frequency, hematuria and urgency.  Musculoskeletal: Positive for joint pain (hips) and myalgias (left second finger). Negative for back pain, falls and neck pain.  Skin: Negative for itching and rash.  Neurological: Negative for dizziness, tingling, sensory change, weakness and headaches.  Endo/Heme/Allergies: Bruises/bleeds easily (easy bruising).  Psychiatric/Behavioral: Negative for depression and memory loss. The patient is not nervous/anxious and does not have insomnia.   All other systems reviewed and are negative.  Performance status (ECOG): 1  Vitals Blood pressure 132/85, pulse 65, temperature (!) 96.5 F (35.8 C), temperature source Tympanic, weight 260 lb 5.8 oz (118.1 kg), SpO2 100 %.  Physical Exam Vitals and nursing note reviewed.  Constitutional:      General: He is not in acute distress.    Appearance: He is not diaphoretic.  HENT:     Head: Normocephalic and atraumatic.     Comments: Red hair and beard.    Mouth/Throat:     Mouth: Mucous membranes are moist.     Pharynx: Oropharynx is clear.  Eyes:     General: No scleral icterus.    Extraocular Movements: Extraocular movements intact.     Conjunctiva/sclera: Conjunctivae normal.     Pupils: Pupils are equal, round, and reactive to light.     Comments: Blue eyes.  Cardiovascular:     Rate and Rhythm: Normal rate and regular rhythm.     Heart sounds: Normal heart sounds. No murmur heard.   Pulmonary:     Effort: Pulmonary effort is normal. No respiratory distress.     Breath sounds: Normal breath sounds. No wheezing or rales.  Chest:     Chest wall: No tenderness.  Breasts:     Right: No axillary adenopathy or supraclavicular  adenopathy.     Left: No axillary adenopathy or supraclavicular adenopathy.    Abdominal:     General: Bowel sounds are normal. There is no distension.     Palpations: Abdomen is soft. There is no mass.     Tenderness: There is no abdominal tenderness. There is no guarding or rebound.  Musculoskeletal:        General: No swelling or tenderness. Normal range of motion.     Cervical back: Normal range of motion and neck supple.  Lymphadenopathy:     Head:     Right side of head: No preauricular, posterior auricular or occipital adenopathy.     Left side of head: No preauricular, posterior auricular or occipital adenopathy.     Cervical: No cervical adenopathy.     Upper Body:     Right upper body: No supraclavicular or axillary adenopathy.     Left upper body: No supraclavicular or axillary adenopathy.     Lower Body: No right inguinal adenopathy. No left inguinal adenopathy.  Skin:    General: Skin is warm and dry.  Neurological:     Mental Status: He is alert and oriented to person, place, and time.  Psychiatric:        Behavior: Behavior normal.        Thought Content: Thought content normal.        Judgment: Judgment normal.     Appointment on 03/31/2020  Component Date Value Ref Range Status  . WBC 03/31/2020 3.4* 4.0 - 10.5 K/uL Final  . RBC 03/31/2020 5.45  4.22 - 5.81 MIL/uL Final  . Hemoglobin 03/31/2020 14.9  13.0 - 17.0 g/dL Final  . HCT 03/31/2020 45.8  39.0 - 52.0 % Final  . MCV 03/31/2020 84.0  80.0 - 100.0 fL Final  . MCH 03/31/2020 27.3  26.0 - 34.0 pg Final  . MCHC 03/31/2020 32.5  30.0 - 36.0 g/dL Final  . RDW 03/31/2020 19.0* 11.5 - 15.5 % Final  . Platelets 03/31/2020 55* 150 - 400 K/uL Final   Comment: Immature Platelet Fraction may be clinically indicated, consider ordering this additional test [id number]   . nRBC 03/31/2020 0.0  0.0 - 0.2 % Final  . Neutrophils Relative % 03/31/2020 60  % Final  . Neutro Abs 03/31/2020 2.1  1.7 - 7.7 K/uL Final  .  Lymphocytes Relative 03/31/2020 27  %  Final  . Lymphs Abs 03/31/2020 0.9  0.7 - 4.0 K/uL Final  . Monocytes Relative 03/31/2020 10  % Final  . Monocytes Absolute 03/31/2020 0.3  0.1 - 1.0 K/uL Final  . Eosinophils Relative 03/31/2020 2  % Final  . Eosinophils Absolute 03/31/2020 0.1  0.0 - 0.5 K/uL Final  . Basophils Relative 03/31/2020 1  % Final  . Basophils Absolute 03/31/2020 0.0  0.0 - 0.1 K/uL Final  . Immature Granulocytes 03/31/2020 0  % Final  . Abs Immature Granulocytes 03/31/2020 0.01  0.00 - 0.07 K/uL Final   Performed at [facility], [address], [place], [place] [address]     Assessment:  Clayvon Parlett is a 60 y.o. male with leukopenia and thrombocytopenia.  Bone marrow on 12/17/2019 revealed a hypercellular marrow with dysmegakaryocytopoiesis. The features were suggestive of a low-grade myelodysplastic syndrome. Cytogenetics were normal (46, XY).  Bone marrow review at [facility] did not reveal sufficient dysplasia to meet diagnostic criteria for a myelodysplastic syndrome.  The minimal dysplasia, leukopenia and thrombocytopenia in the marrow may be the result of reactive changes.  Early low-grade myelodysplastic syndrome could not be ruled out.  CBC on 11/14/2019 revealed a hematocrit 43.3, hemoglobin 14.2, MCV 85.9, platelets 77,000, WBC 3,600 (ANC 2,000).  SPEP was normal on 10/17/2019. Vitamin B12 was 838 on 10/17/2019.  TSH and free T4 were normal on 04/16/2019.  Work-up on 11/27/2019 revealed a hematocrit of 47.5, hemoglobin 15.3, platelets 82,000, WBC 5,000. Platelet count in a citrate was 81,400.  AST was 45, ALT 45, and bilirubin 1.8. Normal labs included PT (31), PT (14.1), LDH (176),  copper (78), TSH (1.579), free T4 (0.70), folate (6.8), ANA, H pylori antibody, HIV antibody, hepatitis B core antibody, and hepatitis C antibody were negative.  Immature platelet fraction was 7.9% (normal) on 12/11/2019.   Peripheral smear revealed mild thrombocytopenia without  clumping, which has been persistent. RBCs were normochromic, with mild anisocytosis. There was no poikilocytosis or fragmentation.  There were no abnormal or immature leukocytes were identified.  Abdominal ultrasound on 12/07/2019 revealed diffusely increased echotexture throughout the liver c/w fatty infiltration. Portal vein was patent on Doppler with normal direction of blood flow toward the liver.  There was splenomegaly (13.8 cm). There was cholelithiasis. There was no evidence of acute cholecystitis.  Abdomen and pelvis CT on 01/02/2020 revealed morphologic changes to the liver raising the possibility of cirrhosis. There were sequelae of portal venous hypertension including splenomegaly (15.5 cm) and upper abdominal collateral vessels. There was cholelithiasis. There was mild gallbladder wall thickening which may be secondary to portal venous hypertension/cirrhosis.  AFP was 3.4 on 12/19/2019.  EGD on 01/22/2020 revealed grade II esophageal varices, gastritis, and portal hypertensive gastropathy. Colonoscopy on 01/22/2020 revealed one 2 mm polyp in the transverse colon, one 3 mm polyp in the transverse colon, and two 3 to 7 mm polyps in the descending colon (3 tubular adenomas, one hyperplastic polyp).  He has iron deficiency.  He has a history of upper GI bleed felt related to ibuprofen in 06/2019. Ferritin was 9 on 05/28/2019 and 19 on 10/17/2019. Iron saturation was 6% and TIBC was 467 on 05/28/2019. Reticulocyte count was 1.34% on 10/17/2019. Sed rate was 2 and CRP was 1 on 11/14/2019.  At home hemoccult test was positive on 10/25/2019.  The patient received the [place] COVID-19 vaccine on 05/02/2019 and 05/23/2019.  Symptomatically, he feels "ok."  He bruises easily.  He has some nausea and RUQ abdominal pain felt  due to gallstones.  WBC is 3400.  Platelets are 55,000.  Plan: 1.   Labs today: CBC with diff, ferritin, iron studies. 2.   Leukopenia and thrombocytopenia  Bone marrow on  12/16/2020 revealed features suggestive of a low grade myelodysplastic syndrome.   FISH studies were inadvertantly not done.  Bone marrow review at [facility] did not reveal sufficient dysplasia to meet diagnostic criteria for a myelodysplastic syndrome.   The minimal dysplasia, leukopenia and thrombocytopenia in the marrow may be the result of reactive changes.     Early low-grade myelodysplastic syndrome could not be ruled out.    Hematocrit 45.8.  Hemoglobin 14.9.  MCV 84.0.  Platelets 55,000.  WBC 3400 with an [place] of 2100 on 03/31/2020.  Review with patient second opinion of pathology at [facility].   Etiology of low counts may in part be secondary to cirrhosis and splenomegaly.  Discuss plans for a  repeat bone marrow if he develops grade 3 neutropenia (ANC < 1000) or thrombocytopenia (platelets < 50,000). 3.   Iron deficiency  [place] card was + on 10/25/2019. He notes a history of abdominal discomfort, appetite loss, and a 40 pound weight loss (weight up 6 pounds since last visit).  EGD on 01/22/2020 revealed grade II esophageal varices, gastritis, and portal hypertensive gastropathy.   Colonoscopy on 01/22/2020 revealed 4 polyps (3 tubular adenomas, one hyperplastic polyp). Ferritin 25 with an iron saturation of 11% and a TIBC of 470.  Continue oral iron.    Consider IV iron if unable to replete iron stores.  4.   Portal hypertension   Abdomen and pelvis CT on 01/02/2020 revealed morphologic changes to the liver suggestive of cirrhosis.    There were sequelae of portal venous hypertension including splenomegaly (15.5 cm) and upper abdominal collateral vessels.  5.   RTC in 3 months for MD assess and labs (CBC with diff, CMP, ferritin, iron studies- day before) and +/- Venofer.  I discussed the assessment and treatment plan with the patient.  The patient was provided an opportunity to ask questions and all were answered.  The patient agreed with the plan and  demonstrated an understanding of the instructions.  The patient was advised to call back if the symptoms worsen or if the condition fails to improve as anticipated.  I provided 14 minutes of face-to-face time during this this encounter and > 50% was spent counseling as documented under my assessment and plan.  An additional 10 minutes were spent reviewing his chart (Epic and Care Everywhere) including notes, labs, and imaging studies.    Emanie Behan C. Mike Gip, MD, PhD    03/31/2020 , 11:59 AM   I, Mirian Mo Tufford , am acting as Education administrator for [employer]. Mike Gip, MD, PhD.  I, Deryn Massengale C. Mike Gip, MD, have reviewed the above documentation for accuracy and completeness, and I agree with the above.

## 2020-03-31 ENCOUNTER — Inpatient Hospital Stay (HOSPITAL_BASED_OUTPATIENT_CLINIC_OR_DEPARTMENT_OTHER): Payer: Commercial Managed Care - PPO | Admitting: Hematology and Oncology

## 2020-03-31 ENCOUNTER — Telehealth: Payer: Self-pay

## 2020-03-31 ENCOUNTER — Inpatient Hospital Stay: Payer: Commercial Managed Care - PPO

## 2020-03-31 ENCOUNTER — Encounter: Payer: Self-pay | Admitting: Hematology and Oncology

## 2020-03-31 ENCOUNTER — Other Ambulatory Visit: Payer: Self-pay

## 2020-03-31 VITALS — BP 132/85 | HR 65 | Temp 96.5°F | Wt 260.4 lb

## 2020-03-31 DIAGNOSIS — E611 Iron deficiency: Secondary | ICD-10-CM | POA: Diagnosis present

## 2020-03-31 DIAGNOSIS — D696 Thrombocytopenia, unspecified: Secondary | ICD-10-CM

## 2020-03-31 DIAGNOSIS — R161 Splenomegaly, not elsewhere classified: Secondary | ICD-10-CM | POA: Diagnosis not present

## 2020-03-31 DIAGNOSIS — D4620 Refractory anemia with excess of blasts, unspecified: Secondary | ICD-10-CM | POA: Diagnosis not present

## 2020-03-31 DIAGNOSIS — D509 Iron deficiency anemia, unspecified: Secondary | ICD-10-CM

## 2020-03-31 DIAGNOSIS — D469 Myelodysplastic syndrome, unspecified: Secondary | ICD-10-CM

## 2020-03-31 DIAGNOSIS — D72819 Decreased white blood cell count, unspecified: Secondary | ICD-10-CM

## 2020-03-31 DIAGNOSIS — K766 Portal hypertension: Secondary | ICD-10-CM

## 2020-03-31 LAB — CBC WITH DIFFERENTIAL/PLATELET
Abs Immature Granulocytes: 0.01 10*3/uL (ref 0.00–0.07)
Basophils Absolute: 0 10*3/uL (ref 0.0–0.1)
Basophils Relative: 1 %
Eosinophils Absolute: 0.1 10*3/uL (ref 0.0–0.5)
Eosinophils Relative: 2 %
HCT: 45.8 % (ref 39.0–52.0)
Hemoglobin: 14.9 g/dL (ref 13.0–17.0)
Immature Granulocytes: 0 %
Lymphocytes Relative: 27 %
Lymphs Abs: 0.9 10*3/uL (ref 0.7–4.0)
MCH: 27.3 pg (ref 26.0–34.0)
MCHC: 32.5 g/dL (ref 30.0–36.0)
MCV: 84 fL (ref 80.0–100.0)
Monocytes Absolute: 0.3 10*3/uL (ref 0.1–1.0)
Monocytes Relative: 10 %
Neutro Abs: 2.1 10*3/uL (ref 1.7–7.7)
Neutrophils Relative %: 60 %
Platelets: 55 10*3/uL — ABNORMAL LOW (ref 150–400)
RBC: 5.45 MIL/uL (ref 4.22–5.81)
RDW: 19 % — ABNORMAL HIGH (ref 11.5–15.5)
WBC: 3.4 10*3/uL — ABNORMAL LOW (ref 4.0–10.5)
nRBC: 0 % (ref 0.0–0.2)

## 2020-03-31 LAB — IRON AND TIBC
Iron: 53 ug/dL (ref 45–182)
Saturation Ratios: 11 % — ABNORMAL LOW (ref 17.9–39.5)
TIBC: 470 ug/dL — ABNORMAL HIGH (ref 250–450)
UIBC: 417 ug/dL

## 2020-03-31 LAB — FERRITIN: Ferritin: 25 ng/mL (ref 24–336)

## 2020-04-01 ENCOUNTER — Telehealth: Payer: Self-pay

## 2020-04-07 ENCOUNTER — Other Ambulatory Visit: Payer: Self-pay

## 2020-04-07 ENCOUNTER — Inpatient Hospital Stay: Payer: Commercial Managed Care - PPO

## 2020-04-07 VITALS — BP 133/82 | HR 62 | Resp 18

## 2020-04-07 DIAGNOSIS — D509 Iron deficiency anemia, unspecified: Secondary | ICD-10-CM

## 2020-04-07 DIAGNOSIS — E611 Iron deficiency: Secondary | ICD-10-CM | POA: Diagnosis not present

## 2020-04-07 MED ORDER — SODIUM CHLORIDE 0.9 % IV SOLN
Freq: Once | INTRAVENOUS | Status: AC
  Filled 2020-04-07: qty 250

## 2020-04-07 MED ORDER — SODIUM CHLORIDE 0.9 % IV SOLN: 200.0000 mg | Freq: Once | INTRAVENOUS | Status: DC

## 2020-04-07 MED ORDER — IRON SUCROSE 20 MG/ML IV SOLN
200.0000 mg | Freq: Once | INTRAVENOUS | Status: AC
  Administered 2020-04-07: 200 mg via INTRAVENOUS
  Filled 2020-04-07: qty 10

## 2020-04-07 NOTE — Progress Notes (Signed)
Pt received prescribed treatment in clinic, pt stable at d/c. 

## 2020-04-08 ENCOUNTER — Encounter: Payer: Self-pay | Admitting: Hematology and Oncology

## 2020-04-10 ENCOUNTER — Telehealth: Payer: Self-pay | Admitting: Hematology and Oncology

## 2020-04-10 NOTE — Telephone Encounter (Signed)
Returned call to patient and informed him of Dr. Kem Parkinson response to MyChart message sent on 3/1 regarding bradycardia with IV iron:   I doubt that the iron has caused bradycardia.  I have never seen a low heart rate with IV iron.   My review of the potential side effects of Venofer include bradycardia at < 1%.   I would defer the question to your cardiologist.   We can defer additional IV iron until this question is resolved.   Please follow-up with Dr Raechel Ache and your cardiologist.   Lequita Asal, MD

## 2020-04-10 NOTE — Telephone Encounter (Signed)
04/10/2020 Left VM for pt informing him that upcoming venofer infusion on 3/7 has been cxl per Dr. Loletha Grayer until his bradycardia issue is resolved. Left call back number in case pt has any questions SRW

## 2020-04-10 NOTE — Telephone Encounter (Signed)
Pt called in regards to a cancelled infusion appt on 3/7 due to a "low heart rate". He states he received a message about this.  He has requested to speak with a nurse.

## 2020-04-14 ENCOUNTER — Ambulatory Visit: Payer: Commercial Managed Care - PPO

## 2020-04-14 DIAGNOSIS — K766 Portal hypertension: Secondary | ICD-10-CM | POA: Insufficient documentation

## 2020-04-14 NOTE — Progress Notes (Signed)
This encounter was created in error - please disregard.

## 2020-05-13 ENCOUNTER — Other Ambulatory Visit: Payer: Self-pay

## 2020-05-13 ENCOUNTER — Ambulatory Visit
Admission: RE | Admit: 2020-05-13 | Discharge: 2020-05-13 | Disposition: A | Discharge status: Home / Self Care | Payer: Commercial Managed Care - PPO | Source: Ambulatory Visit | Attending: Gastroenterology | Admitting: Gastroenterology

## 2020-05-13 ENCOUNTER — Other Ambulatory Visit: Payer: Self-pay | Admitting: Gastroenterology

## 2020-05-13 DIAGNOSIS — R1084 Generalized abdominal pain: Secondary | ICD-10-CM | POA: Diagnosis present

## 2020-05-13 DIAGNOSIS — K7460 Unspecified cirrhosis of liver: Secondary | ICD-10-CM

## 2020-05-13 HISTORY — DX: Essential (primary) hypertension: I10

## 2020-05-13 LAB — POCT I-STAT CREATININE: Creatinine, Ser: 0.7 mg/dL (ref 0.61–1.24)

## 2020-05-13 MED ORDER — IOHEXOL 300 MG/ML  SOLN
100.0000 mL | Freq: Once | INTRAMUSCULAR | Status: AC | PRN
  Administered 2020-05-13: 100 mL via INTRAVENOUS

## 2020-06-18 ENCOUNTER — Other Ambulatory Visit: Payer: Self-pay

## 2020-06-18 DIAGNOSIS — D509 Iron deficiency anemia, unspecified: Secondary | ICD-10-CM

## 2020-06-19 ENCOUNTER — Other Ambulatory Visit: Payer: Self-pay

## 2020-06-19 ENCOUNTER — Inpatient Hospital Stay: Payer: Commercial Managed Care - PPO | Attending: Nurse Practitioner | Admitting: Oncology

## 2020-06-19 DIAGNOSIS — D509 Iron deficiency anemia, unspecified: Secondary | ICD-10-CM | POA: Diagnosis present

## 2020-06-19 LAB — CBC WITH DIFFERENTIAL/PLATELET
Abs Immature Granulocytes: 0 10*3/uL (ref 0.00–0.07)
Basophils Absolute: 0 10*3/uL (ref 0.0–0.1)
Basophils Relative: 1 %
Eosinophils Absolute: 0 10*3/uL (ref 0.0–0.5)
Eosinophils Relative: 2 %
HCT: 44.6 % (ref 39.0–52.0)
Hemoglobin: 14.6 g/dL (ref 13.0–17.0)
Immature Granulocytes: 0 %
Lymphocytes Relative: 37 %
Lymphs Abs: 1 10*3/uL (ref 0.7–4.0)
MCH: 26.8 pg (ref 26.0–34.0)
MCHC: 32.7 g/dL (ref 30.0–36.0)
MCV: 81.8 fL (ref 80.0–100.0)
Monocytes Absolute: 0.2 10*3/uL (ref 0.1–1.0)
Monocytes Relative: 9 %
Neutro Abs: 1.3 10*3/uL — ABNORMAL LOW (ref 1.7–7.7)
Neutrophils Relative %: 51 %
Platelets: 60 10*3/uL — ABNORMAL LOW (ref 150–400)
RBC: 5.45 MIL/uL (ref 4.22–5.81)
RDW: 15 % (ref 11.5–15.5)
WBC: 2.6 10*3/uL — ABNORMAL LOW (ref 4.0–10.5)
nRBC: 0 % (ref 0.0–0.2)

## 2020-06-19 LAB — COMPREHENSIVE METABOLIC PANEL
ALT: 33 U/L (ref 0–44)
AST: 38 U/L (ref 15–41)
Albumin: 4.1 g/dL (ref 3.5–5.0)
Alkaline Phosphatase: 73 U/L (ref 38–126)
Anion gap: 6 (ref 5–15)
BUN: 12 mg/dL (ref 6–20)
CO2: 28 mmol/L (ref 22–32)
Calcium: 9.5 mg/dL (ref 8.9–10.3)
Chloride: 103 mmol/L (ref 98–111)
Creatinine, Ser: 0.73 mg/dL (ref 0.61–1.24)
GFR, Estimated: >60 mL/min (ref >60)
Glucose, Bld: 209 mg/dL — ABNORMAL HIGH (ref 70–99)
Potassium: 3.7 mmol/L (ref 3.5–5.1)
Sodium: 137 mmol/L (ref 135–145)
Total Bilirubin: 2.1 mg/dL — ABNORMAL HIGH (ref 0.3–1.2)
Total Protein: 6.7 g/dL (ref 6.5–8.1)

## 2020-06-19 LAB — IRON AND TIBC
Iron: 42 ug/dL — ABNORMAL LOW (ref 45–182)
Saturation Ratios: 9 % — ABNORMAL LOW (ref 17.9–39.5)
TIBC: 466 ug/dL — ABNORMAL HIGH (ref 250–450)
UIBC: 424 ug/dL

## 2020-06-19 NOTE — Progress Notes (Signed)
You see him on 06/26/20 it looks like.   Faythe Casa, NP 06/19/2020 10:34 AM

## 2020-06-23 ENCOUNTER — Other Ambulatory Visit: Payer: Commercial Managed Care - PPO

## 2020-06-24 ENCOUNTER — Inpatient Hospital Stay: Payer: Commercial Managed Care - PPO

## 2020-06-24 ENCOUNTER — Encounter: Payer: Self-pay | Admitting: Nurse Practitioner

## 2020-06-24 ENCOUNTER — Other Ambulatory Visit: Payer: Self-pay

## 2020-06-24 ENCOUNTER — Ambulatory Visit: Payer: Commercial Managed Care - PPO | Admitting: Hematology and Oncology

## 2020-06-24 ENCOUNTER — Inpatient Hospital Stay (HOSPITAL_BASED_OUTPATIENT_CLINIC_OR_DEPARTMENT_OTHER): Payer: Commercial Managed Care - PPO | Admitting: Nurse Practitioner

## 2020-06-24 ENCOUNTER — Ambulatory Visit: Payer: Commercial Managed Care - PPO

## 2020-06-24 VITALS — BP 124/70 | HR 73 | Temp 97.3°F | Resp 20 | Wt 265.8 lb

## 2020-06-24 VITALS — BP 122/71 | HR 72

## 2020-06-24 DIAGNOSIS — D696 Thrombocytopenia, unspecified: Secondary | ICD-10-CM

## 2020-06-24 DIAGNOSIS — D509 Iron deficiency anemia, unspecified: Secondary | ICD-10-CM

## 2020-06-24 DIAGNOSIS — D72819 Decreased white blood cell count, unspecified: Secondary | ICD-10-CM

## 2020-06-24 MED ORDER — SODIUM CHLORIDE 0.9 % IV SOLN
Freq: Once | INTRAVENOUS | Status: AC
  Filled 2020-06-24: qty 250

## 2020-06-24 MED ORDER — IRON SUCROSE 20 MG/ML IV SOLN
200.0000 mg | Freq: Once | INTRAVENOUS | Status: AC
  Administered 2020-06-24: 200 mg via INTRAVENOUS
  Filled 2020-06-24: qty 10

## 2020-06-24 MED ORDER — SODIUM CHLORIDE 0.9 % IV SOLN: 200.0000 mg | Freq: Once | INTRAVENOUS | Status: DC

## 2020-06-24 NOTE — Progress Notes (Signed)
[facility]  [address] [place], [place] [address] Phone: [phone]  Fax: [phone]   Clinic Day: 06/24/20  Referring physician: Ezequiel Kayser, MD  Chief Complaint: Juan Garza is a 60 y.o. male with iron deficiency anemia, thrombocytopenia, portal hypertension, and a possible low grade myelodysplastic syndrome who is seen for 1 month assessment and discussion regarding direction of therapy.  HPI:  Juan Garza is a 60 y.o. male with leukopenia and thrombocytopenia.  Bone marrow on 12/17/2019 revealed a hypercellular marrow with dysmegakaryocytopoiesis. The features were suggestive of a low-grade myelodysplastic syndrome. Cytogenetics were normal (46, XY).  Bone marrow review at [facility] did not reveal sufficient dysplasia to meet diagnostic criteria for a myelodysplastic syndrome.  The minimal dysplasia, leukopenia and thrombocytopenia in the marrow may be the result of reactive changes.  Early low-grade myelodysplastic syndrome could not be ruled out.  CBC on 11/14/2019 revealed a hematocrit 43.3, hemoglobin 14.2, MCV 85.9, platelets 77,000, WBC 3,600 (ANC 2,000).  SPEP was normal on 10/17/2019. Vitamin B12 was 838 on 10/17/2019.  TSH and free T4 were normal on 04/16/2019.  Work-up on 11/27/2019 revealed a hematocrit of 47.5, hemoglobin 15.3, platelets 82,000, WBC 5,000. Platelet count in a citrate was 81,400.  AST was 45, ALT 45, and bilirubin 1.8. Normal labs included PT (31), PT (14.1), LDH (176),  copper (78), TSH (1.579), free T4 (0.70), folate (6.8), ANA, H pylori antibody, HIV antibody, hepatitis B core antibody, and hepatitis C antibody were negative.  Immature platelet fraction was 7.9% (normal) on 12/11/2019.   Peripheral smear revealed mild thrombocytopenia without clumping, which has been persistent. RBCs were normochromic, with mild anisocytosis. There was no poikilocytosis or fragmentation.  There were no abnormal or immature leukocytes were  identified.  Abdominal ultrasound on 12/07/2019 revealed diffusely increased echotexture throughout the liver c/w fatty infiltration. Portal vein was patent on Doppler with normal direction of blood flow toward the liver.  There was splenomegaly (13.8 cm). There was cholelithiasis. There was no evidence of acute cholecystitis.  Abdomen and pelvis CT on 01/02/2020 revealed morphologic changes to the liver raising the possibility of cirrhosis. There were sequelae of portal venous hypertension including splenomegaly (15.5 cm) and upper abdominal collateral vessels. There was cholelithiasis. There was mild gallbladder wall thickening which may be secondary to portal venous hypertension/cirrhosis.  AFP was 3.4 on 12/19/2019.  EGD on 01/22/2020 revealed grade II esophageal varices, gastritis, and portal hypertensive gastropathy. Colonoscopy on 01/22/2020 revealed one 2 mm polyp in the transverse colon, one 3 mm polyp in the transverse colon, and two 3 to 7 mm polyps in the descending colon (3 tubular adenomas, one hyperplastic polyp).  He has iron deficiency.  He has a history of upper GI bleed felt related to ibuprofen in 06/2019. Ferritin was 9 on 05/28/2019 and 19 on 10/17/2019. Iron saturation was 6% and TIBC was 467 on 05/28/2019. Reticulocyte count was 1.34% on 10/17/2019. Sed rate was 2 and CRP was 1 on 11/14/2019.  At home hemoccult test was positive on 10/25/2019.  The patient received the [place] COVID-19 vaccine on 05/02/2019 and 05/23/2019.  Interval History: Patient returns to clinic for follow up and consideration of IV iron. She has lower extremity swelling, fatigue, shortness of breath with exertion. Is followed by cardiology. Denies any neurologic complaints. Denies recent fevers or illnesses. Denies any easy bleeding or bruising. No melena or hematochezia. No pica or restless leg. Reports good appetite and denies weight loss. Denies chest pain. Denies any nausea, vomiting,  constipation, or  diarrhea. Denies urinary complaints. Patient offers no further specific complaints today.   Past Medical History:  Diagnosis Date   Chronic back pain    Diabetes mellitus without complication ([place])    Hypertension     Past Surgical History:  Procedure Laterality Date   ABDOMINAL SURGERY     APPENDECTOMY     BACK SURGERY     posteriior lumbar spine fusion   CARDIAC CATHETERIZATION     CARDIAC VALVE REPLACEMENT     colectomy partial w/anastoamosis     COLONOSCOPY WITH PROPOFOL N/A 01/22/2020   Procedure: COLONOSCOPY WITH PROPOFOL;  Surgeon: Lesly Rubenstein, MD;  Location: [facility] ENDOSCOPY;  Service: Endoscopy;  Laterality: N/A;   ESOPHAGOGASTRODUODENOSCOPY (EGD) WITH PROPOFOL N/A 01/22/2020   Procedure: ESOPHAGOGASTRODUODENOSCOPY (EGD) WITH PROPOFOL;  Surgeon: Lesly Rubenstein, MD;  Location: [facility] ENDOSCOPY;  Service: Endoscopy;  Laterality: N/A;   HERNIA REPAIR     JOINT REPLACEMENT     KNEE SURGERY Left    NASAL SINUS SURGERY      Family History  Problem Relation Age of Onset   Atrial fibrillation Mother    Diabetes Father     Social History:  reports that he has quit smoking. He has never used smokeless tobacco. He reports current alcohol use. He reports that he does not use drugs. He smoked 1.5 packs per day for ~20 years and quit in 1996. He denies drug use. He drinks 3 drinks weekly, usually rum and coke. He worked at a [employer] in his early 77s. He lives in [place]. 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 The patient is alone today.  Allergies:  Allergies  Allergen Reactions   Imitrex [Sumatriptan] Other (See Comments)    shaking   Seroquel [Quetiapine Fumarate] Other (See Comments)    Leg spasms   Adhesive [Tape] Rash    Current Medications: Current Outpatient Medications  Medication Sig Dispense Refill   atorvastatin (LIPITOR) 10 MG tablet Take 10 mg by mouth daily.     ciprofloxacin (CIPRO) 500 MG tablet Take 500 mg by mouth 2 (two) times daily.      Cyanocobalamin 1000 MCG/ML LIQD Take by mouth.     diclofenac Sodium (VOLTAREN) 1 % GEL Apply topically.     dutasteride (AVODART) 0.5 MG capsule Take by mouth daily.     empagliflozin (JARDIANCE) 10 MG TABS tablet Take 25 mg by mouth daily.     eszopiclone (LUNESTA) 2 MG TABS tablet Take by mouth as needed.     lisinopril (ZESTRIL) 10 MG tablet Take 10 mg by mouth daily.     methocarbamol (ROBAXIN) 750 MG tablet Take by mouth.     metroNIDAZOLE (FLAGYL) 500 MG tablet Take 500 mg by mouth every 8 (eight) hours.     Multiple Vitamin (MULTI-VITAMIN) tablet Take 1 tablet by mouth daily.     pregabalin (LYRICA) 75 MG capsule Take 75 mg by mouth. Takes 150mg  in am and 75mg  at night     propranolol (INDERAL) 20 MG tablet Take 20 mg by mouth 2 (two) times daily.     Semaglutide,0.25 or 0.5MG /DOS, (OZEMPIC, 0.25 OR 0.5 MG/DOSE,) 2 MG/1.5ML SOPN Inject into the skin. Takes every 7 days     sucralfate (CARAFATE) 1 g tablet Take by mouth 2 (two) times daily.     acetaminophen (TYLENOL) 500 MG tablet Take 500 mg by mouth every 6 (six) hours as needed. (Patient not taking: Reported on 06/24/2020)  ascorbic acid (VITAMIN C) 500 MG tablet Take by mouth. (Patient not taking: Reported on 06/24/2020)     CALCIUM-MAGNESUIUM-ZINC 333-133-8.3 MG TABS Take by mouth. (Patient not taking: Reported on 06/24/2020)     ferrous sulfate 325 (65 FE) MG tablet  (Patient not taking: Reported on 06/24/2020)     naproxen (NAPROSYN) 500 MG tablet Take 1 tablet (500 mg total) by mouth 2 (two) times daily. (Patient not taking: Reported on 03/31/2020) 20 tablet 0   omeprazole (PRILOSEC) 40 MG capsule Take 40 mg by mouth 2 (two) times daily.  (Patient not taking: Reported on 03/31/2020)     ondansetron (ZOFRAN) 8 MG tablet Take by mouth. (Patient not taking: Reported on 06/24/2020)     No current facility-administered medications for this visit.     Review of Systems  Constitutional:  Negative for chills, fever, malaise/fatigue and  weight loss.  HENT:  Negative for hearing loss, nosebleeds, sore throat and tinnitus.   Eyes:  Negative for blurred vision and double vision.  Respiratory:  Negative for cough, hemoptysis, shortness of breath and wheezing.   Cardiovascular:  Negative for chest pain, palpitations and leg swelling.  Gastrointestinal:  Negative for abdominal pain, blood in stool, constipation, diarrhea, melena, nausea and vomiting.  Genitourinary:  Negative for dysuria and urgency.  Musculoskeletal:  Negative for back pain, falls, joint pain and myalgias.  Skin:  Negative for itching and rash.  Neurological:  Negative for dizziness, tingling, sensory change, loss of consciousness, weakness and headaches.  Endo/Heme/Allergies:  Negative for environmental allergies. Does not bruise/bleed easily.  Psychiatric/Behavioral:  Negative for depression. The patient is not nervous/anxious and does not have insomnia.   Performance status (ECOG): 1  Vitals Blood pressure 124/70, pulse 73, temperature (!) 97.3 F (36.3 C), resp. rate 20, weight 265 lb 12.2 oz (120.5 kg), SpO2 98 %.   General: Well-developed, well-nourished, no acute distress. Eyes: Pink conjunctiva, anicteric sclera. Lungs: Clear to auscultation bilaterally.  No audible wheezing or coughing Heart: Regular rate and rhythm.  Abdomen: Soft, nontender, nondistended.  Musculoskeletal: No edema, cyanosis, or clubbing. Neuro: Alert, answering all questions appropriately. Cranial nerves grossly intact. Skin: No rashes or petechiae noted. Psych: Normal affect.   No visits with results within 3 Day(s) from this visit.  Latest known visit with results is:  Clinical Support on 06/19/2020  Component Date Value Ref Range Status   Sodium 06/19/2020 137  135 - 145 mmol/L Final   Potassium 06/19/2020 3.7  3.5 - 5.1 mmol/L Final   Chloride 06/19/2020 103  98 - 111 mmol/L Final   CO2 06/19/2020 28  22 - 32 mmol/L Final   Glucose, Bld 06/19/2020 209* 70 - 99 mg/dL  Final   Glucose reference range applies only to samples taken after fasting for at least 8 hours.   BUN 06/19/2020 12  6 - 20 mg/dL Final   Creatinine, Ser 06/19/2020 0.73  0.61 - 1.24 mg/dL Final   Calcium 06/19/2020 9.5  8.9 - 10.3 mg/dL Final   Total Protein 06/19/2020 6.7  6.5 - 8.1 g/dL Final   Albumin 06/19/2020 4.1  3.5 - 5.0 g/dL Final   AST 06/19/2020 38  15 - 41 U/L Final   ALT 06/19/2020 33  0 - 44 U/L Final   Alkaline Phosphatase 06/19/2020 73  38 - 126 U/L Final   Total Bilirubin 06/19/2020 2.1* 0.3 - 1.2 mg/dL Final   GFR, Estimated 06/19/2020 >60  >60 mL/min Final   Comment: (NOTE) Calculated using the  CKD-EPI Creatinine Equation (2021)    Anion gap 06/19/2020 6  5 - 15 Final   Performed at [facility], [address] [place], [place] [address]   WBC 06/19/2020 2.6* 4.0 - 10.5 K/uL Final   RBC 06/19/2020 5.45  4.22 - 5.81 MIL/uL Final   Hemoglobin 06/19/2020 14.6  13.0 - 17.0 g/dL Final   HCT 06/19/2020 44.6  39.0 - 52.0 % Final   MCV 06/19/2020 81.8  80.0 - 100.0 fL Final   MCH 06/19/2020 26.8  26.0 - 34.0 pg Final   MCHC 06/19/2020 32.7  30.0 - 36.0 g/dL Final   RDW 06/19/2020 15.0  11.5 - 15.5 % Final   Platelets 06/19/2020 60* 150 - 400 K/uL Final   Comment: Immature Platelet Fraction may be clinically indicated, consider ordering this additional test [id number]    nRBC 06/19/2020 0.0  0.0 - 0.2 % Final   Neutrophils Relative % 06/19/2020 51  % Final   Neutro Abs 06/19/2020 1.3* 1.7 - 7.7 K/uL Final   Lymphocytes Relative 06/19/2020 37  % Final   Lymphs Abs 06/19/2020 1.0  0.7 - 4.0 K/uL Final   Monocytes Relative 06/19/2020 9  % Final   Monocytes Absolute 06/19/2020 0.2  0.1 - 1.0 K/uL Final   Eosinophils Relative 06/19/2020 2  % Final   Eosinophils Absolute 06/19/2020 0.0  0.0 - 0.5 K/uL Final   Basophils Relative 06/19/2020 1  % Final   Basophils Absolute 06/19/2020 0.0  0.0 - 0.1 K/uL Final   Immature Granulocytes 06/19/2020 0  % Final    Abs Immature Granulocytes 06/19/2020 0.00  0.00 - 0.07 K/uL Final   Performed at [facility], [address] [place], [place] [address]   Iron 06/19/2020 42* 45 - 182 ug/dL Final   TIBC 06/19/2020 466* 250 - 450 ug/dL Final   Saturation Ratios 06/19/2020 9* 17.9 - 39.5 % Final   UIBC 06/19/2020 424  ug/dL Final   Performed at [facility], [address], [place], [place] [address]     Assessment:  1.   Leukopenia and thrombocytopenia- Bone marrow on 12/16/2020 revealed features suggestive of a low grade myelodysplastic syndrome. FISH studies not performed. Reviewed at [facility] and not sufficient dysplasia to meet diagnostic criteria for MDS; possibly reactive. Early low-grade myelodysplastic syndrome could not be ruled out. Labs today reviewed. Etiology of low counts may in part be secondary to cirrhosis and splenomegaly. Discuss plans for a  repeat bone marrow if he develops grade 3 neutropenia (ANC < 1000) or thrombocytopenia (platelets < 50,000).  2.   Iron deficiency- EGD on 01/22/2020 revealed grade II esophageal varices, gastritis, and portal hypertensive gastropathy. Colonoscopy on 01/22/2020 revealed 4 polyps (3 tubular adenomas, one hyperplastic polyp). Iron saturation worse at 9%. No response from oral iron. Recommend IV iron. Risks vs benefits vs alterantives reviewed today. Patient wishes to proceed with venofer today. Plan for 4 doses.   3.   Portal hypertension- Abdomen and pelvis CT on 01/02/2020 revealed morphologic changes to the liver suggestive of cirrhosis. There were sequelae of portal venous hypertension including splenomegaly (15.5 cm) and upper abdominal collateral vessels.   Plan: Venofer weekly x 4 RTC in 3 months for labs (cbc, cmp, ferritin, iron studies), then few days later MD to establish care, +/- venofer.   I discussed the assessment and treatment plan with the patient.  The patient was provided an opportunity to ask questions and all were  answered.  The  patient agreed with the plan and demonstrated an understanding of the instructions.  The patient was advised to call back if the symptoms worsen or if the condition fails to improve as anticipated.  Beckey Rutter, DNP, AGNP-C [place] at [facility]

## 2020-07-01 ENCOUNTER — Inpatient Hospital Stay: Payer: Commercial Managed Care - PPO

## 2020-07-01 ENCOUNTER — Other Ambulatory Visit: Payer: Self-pay

## 2020-07-01 VITALS — BP 151/86 | HR 63 | Temp 96.5°F | Resp 18

## 2020-07-01 DIAGNOSIS — D509 Iron deficiency anemia, unspecified: Secondary | ICD-10-CM | POA: Diagnosis not present

## 2020-07-01 MED ORDER — IRON SUCROSE 20 MG/ML IV SOLN
200.0000 mg | Freq: Once | INTRAVENOUS | Status: AC
  Administered 2020-07-01: 200 mg via INTRAVENOUS
  Filled 2020-07-01: qty 10

## 2020-07-01 MED ORDER — SODIUM CHLORIDE 0.9 % IV SOLN
Freq: Once | INTRAVENOUS | Status: AC
  Filled 2020-07-01: qty 250

## 2020-07-01 MED ORDER — SODIUM CHLORIDE 0.9 % IV SOLN: 200.0000 mg | Freq: Once | INTRAVENOUS | Status: DC

## 2020-07-04 ENCOUNTER — Encounter: Payer: Self-pay | Admitting: Hematology and Oncology

## 2020-07-08 ENCOUNTER — Other Ambulatory Visit: Payer: Self-pay

## 2020-07-08 ENCOUNTER — Inpatient Hospital Stay: Payer: Commercial Managed Care - PPO

## 2020-07-08 VITALS — BP 133/83 | HR 72 | Temp 97.0°F | Resp 18

## 2020-07-08 DIAGNOSIS — D509 Iron deficiency anemia, unspecified: Secondary | ICD-10-CM

## 2020-07-08 MED ORDER — SODIUM CHLORIDE 0.9 % IV SOLN: 200.0000 mg | Freq: Once | INTRAVENOUS | Status: DC

## 2020-07-08 MED ORDER — SODIUM CHLORIDE 0.9 % IV SOLN
Freq: Once | INTRAVENOUS | Status: AC
  Filled 2020-07-08: qty 250

## 2020-07-08 MED ORDER — IRON SUCROSE 20 MG/ML IV SOLN
200.0000 mg | Freq: Once | INTRAVENOUS | Status: AC
Start: 2020-07-08 — End: 2020-07-08
  Administered 2020-07-08: 200 mg via INTRAVENOUS
  Filled 2020-07-08: qty 10

## 2020-07-08 NOTE — Patient Instructions (Signed)

## 2020-07-22 ENCOUNTER — Other Ambulatory Visit: Payer: Self-pay

## 2020-07-22 ENCOUNTER — Inpatient Hospital Stay: Payer: Commercial Managed Care - PPO | Attending: Nurse Practitioner

## 2020-07-22 VITALS — BP 118/73 | HR 70 | Temp 96.0°F | Resp 18

## 2020-07-22 DIAGNOSIS — D509 Iron deficiency anemia, unspecified: Secondary | ICD-10-CM | POA: Insufficient documentation

## 2020-07-22 MED ORDER — SODIUM CHLORIDE 0.9 % IV SOLN: 200.0000 mg | Freq: Once | INTRAVENOUS | Status: DC

## 2020-07-22 MED ORDER — SODIUM CHLORIDE 0.9 % IV SOLN
Freq: Once | INTRAVENOUS | Status: AC
  Filled 2020-07-22: qty 250

## 2020-07-22 MED ORDER — IRON SUCROSE 20 MG/ML IV SOLN
200.0000 mg | Freq: Once | INTRAVENOUS | Status: AC
  Administered 2020-07-22: 200 mg via INTRAVENOUS
  Filled 2020-07-22: qty 10

## 2020-07-22 NOTE — Patient Instructions (Signed)

## 2020-08-18 ENCOUNTER — Encounter: Payer: Self-pay | Admitting: Hematology and Oncology

## 2020-09-09 IMAGING — CR DG HIP (WITH OR WITHOUT PELVIS) 2-3V*L*
3 series · 3 of 3 positions shown · non-contrast
Comparison: None.

CLINICAL DATA: Left hip pain after fall 3 weeks ago. Intense
occasion over the last 2 days.

EXAM:
DG HIP (WITH OR WITHOUT PELVIS) 2-3V LEFT

[pelvis ap]
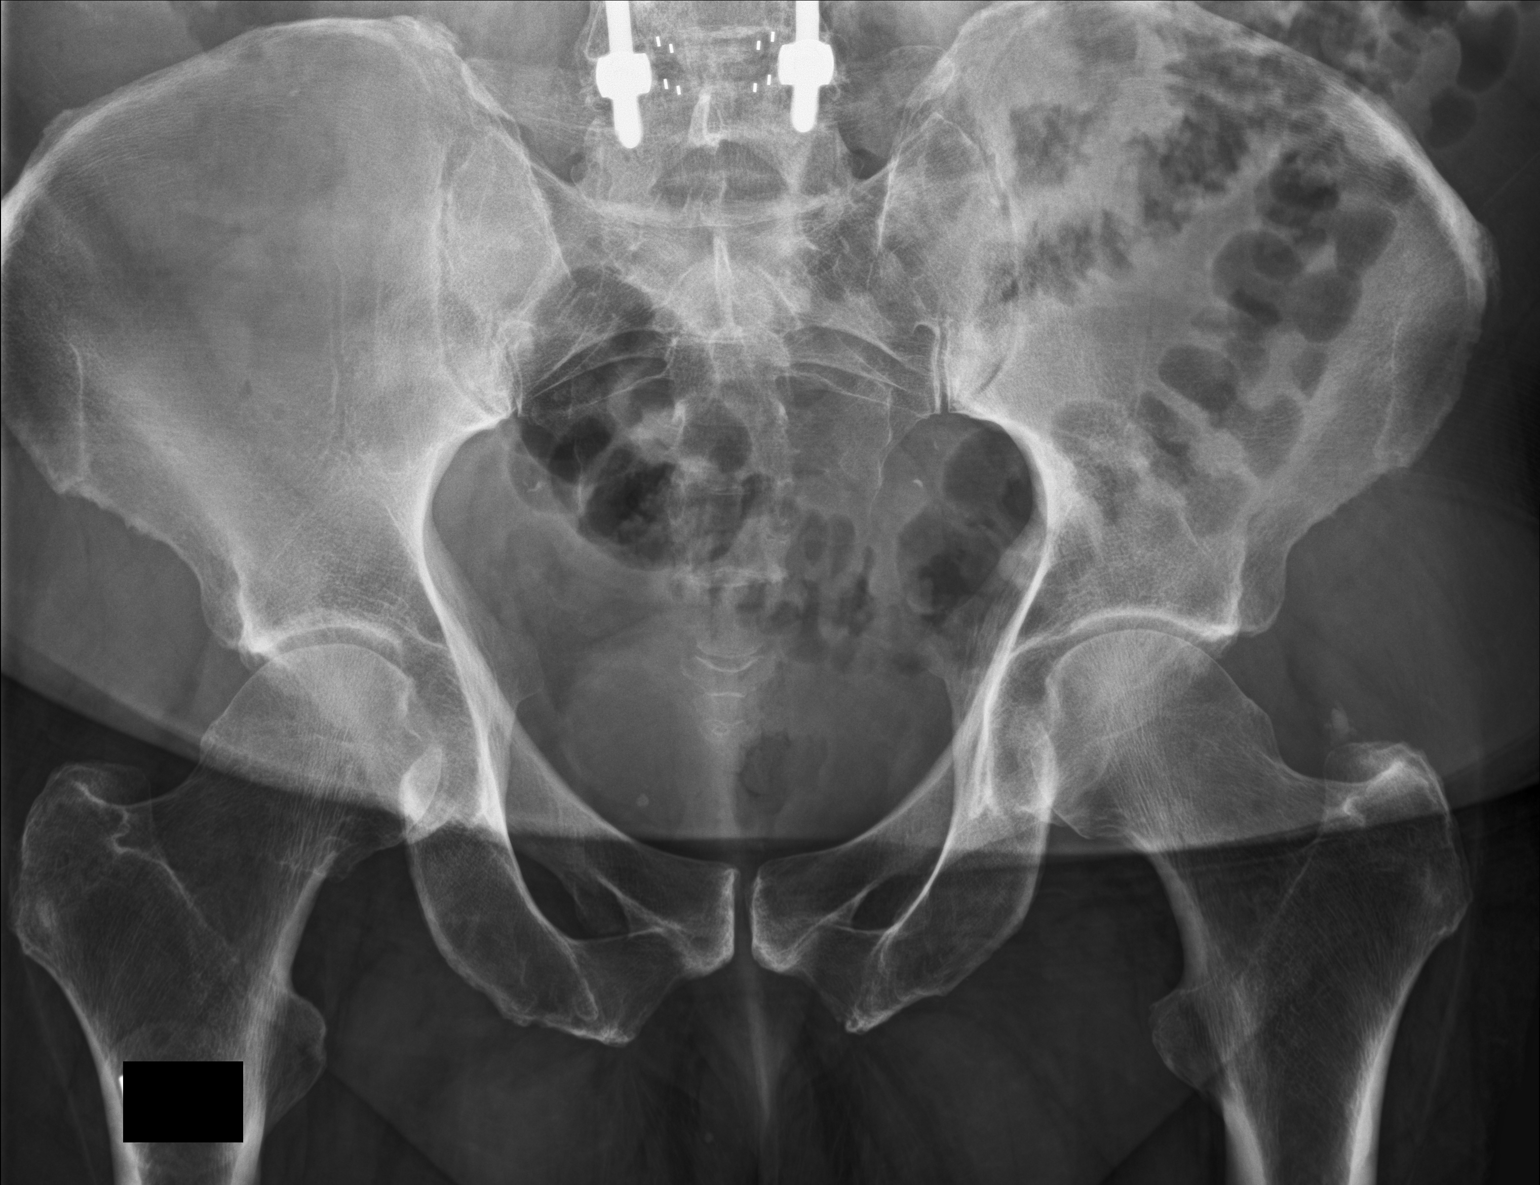

[hip ap]
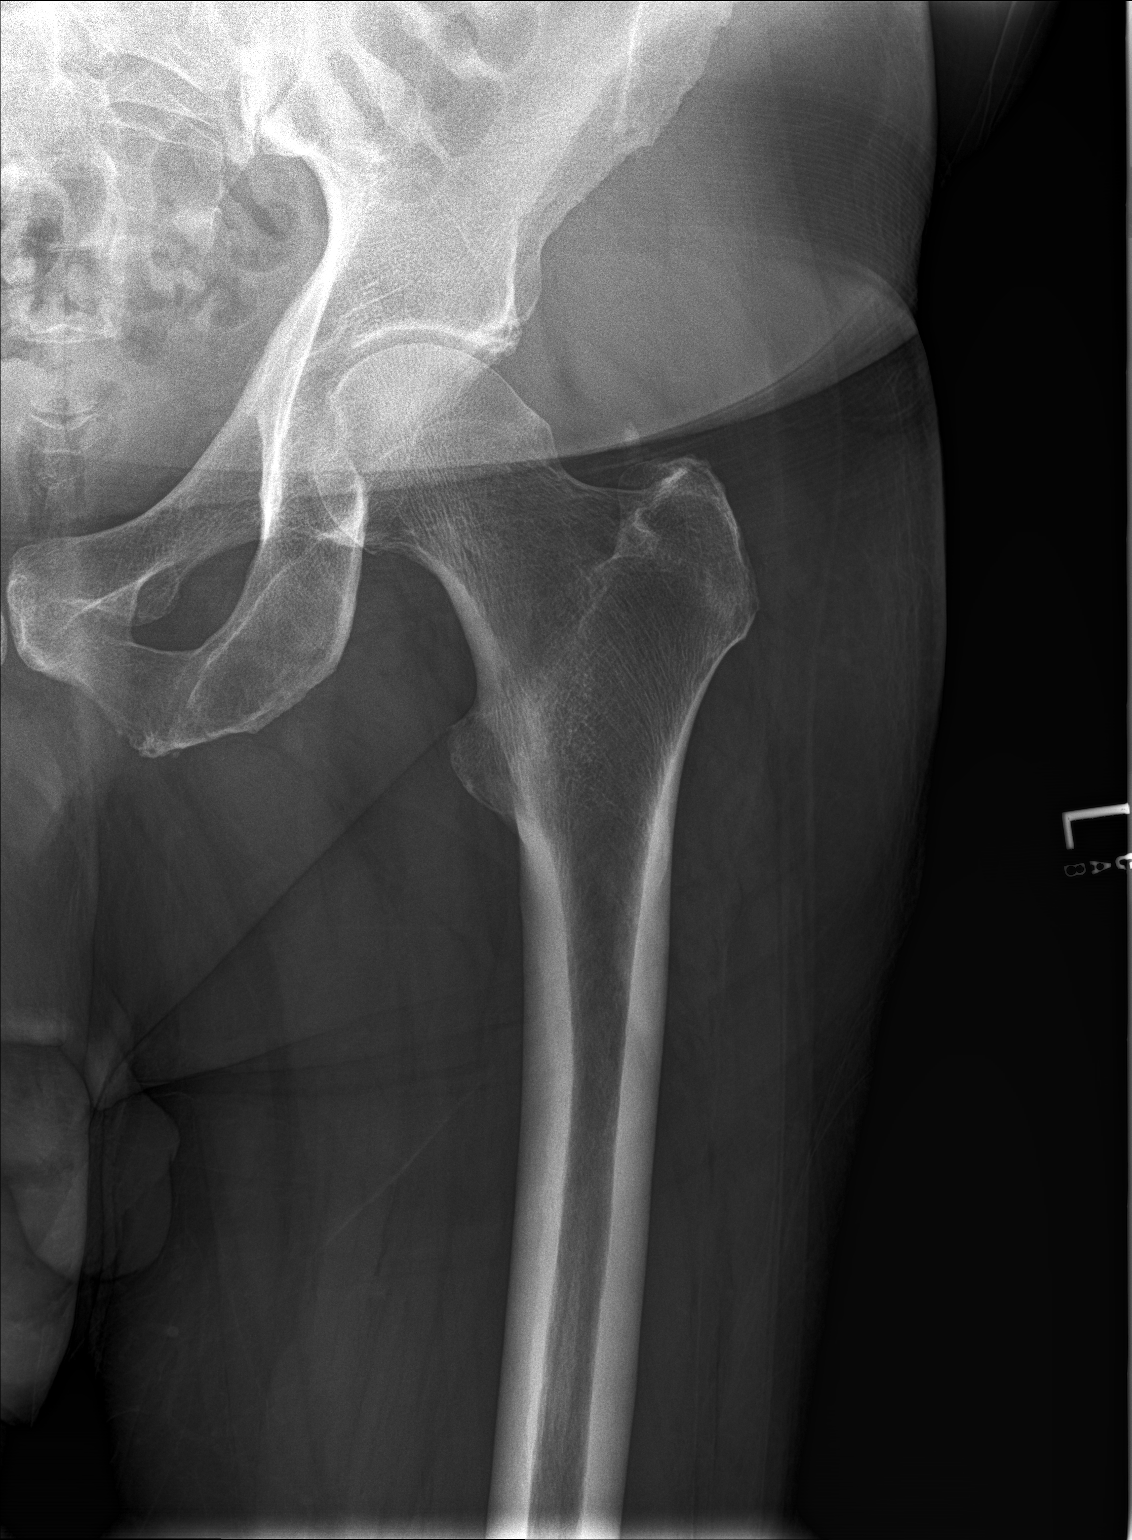

[hip lat]
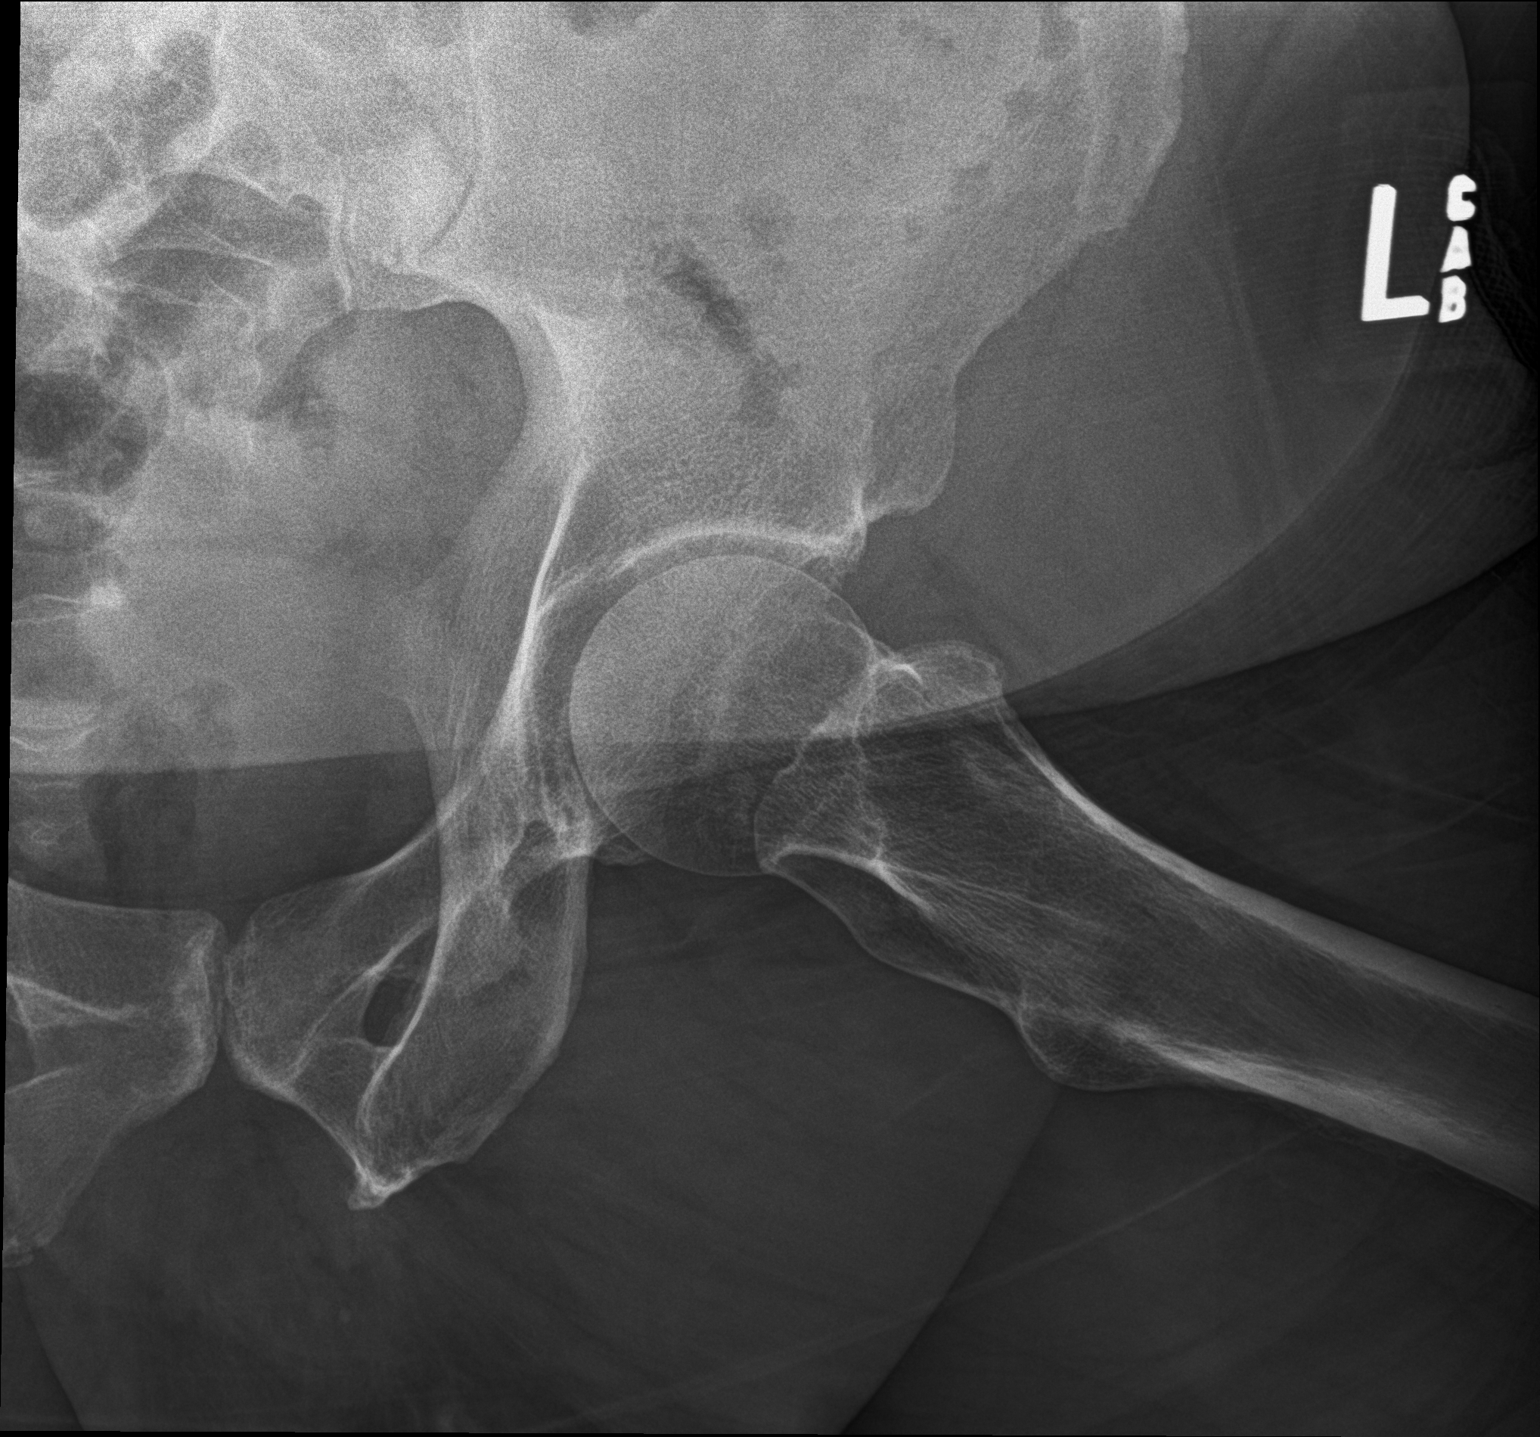

[3 of 3 positions shown; findings below may reference images not displayed]

FINDINGS: Amorphous calcification just above the left greater trochanter.
Spurring of both femoral heads. Preserved articular space in the
hips. No well-defined fracture.
IMPRESSION: 1. Amorphous calcification just above the left greater trochanter,
suspicious for calcific tendinopathy.
2. Mild degenerative spurring of both femoral heads.
3. No acute bony findings are identified.

## 2020-09-10 ENCOUNTER — Encounter: Payer: Self-pay | Admitting: Hematology and Oncology

## 2020-09-10 ENCOUNTER — Emergency Department: Payer: BC Managed Care – PPO

## 2020-09-10 ENCOUNTER — Emergency Department
Admission: EM | Admit: 2020-09-10 | Discharge: 2020-09-10 | Disposition: A | Discharge status: Home / Self Care | Payer: BC Managed Care – PPO | Attending: Emergency Medicine | Admitting: Emergency Medicine

## 2020-09-10 DIAGNOSIS — K625 Hemorrhage of anus and rectum: Secondary | ICD-10-CM | POA: Diagnosis not present

## 2020-09-10 DIAGNOSIS — I10 Essential (primary) hypertension: Secondary | ICD-10-CM | POA: Insufficient documentation

## 2020-09-10 DIAGNOSIS — Z87891 Personal history of nicotine dependence: Secondary | ICD-10-CM | POA: Diagnosis not present

## 2020-09-10 DIAGNOSIS — R110 Nausea: Secondary | ICD-10-CM | POA: Insufficient documentation

## 2020-09-10 DIAGNOSIS — R1032 Left lower quadrant pain: Secondary | ICD-10-CM | POA: Insufficient documentation

## 2020-09-10 DIAGNOSIS — E1142 Type 2 diabetes mellitus with diabetic polyneuropathy: Secondary | ICD-10-CM | POA: Diagnosis not present

## 2020-09-10 DIAGNOSIS — Z7984 Long term (current) use of oral hypoglycemic drugs: Secondary | ICD-10-CM | POA: Insufficient documentation

## 2020-09-10 DIAGNOSIS — Z794 Long term (current) use of insulin: Secondary | ICD-10-CM | POA: Diagnosis not present

## 2020-09-10 DIAGNOSIS — K219 Gastro-esophageal reflux disease without esophagitis: Secondary | ICD-10-CM | POA: Diagnosis not present

## 2020-09-10 DIAGNOSIS — Z79899 Other long term (current) drug therapy: Secondary | ICD-10-CM | POA: Insufficient documentation

## 2020-09-10 DIAGNOSIS — E1140 Type 2 diabetes mellitus with diabetic neuropathy, unspecified: Secondary | ICD-10-CM | POA: Insufficient documentation

## 2020-09-10 DIAGNOSIS — Z96652 Presence of left artificial knee joint: Secondary | ICD-10-CM | POA: Insufficient documentation

## 2020-09-10 LAB — TYPE AND SCREEN
ABO/RH(D): O POS
Antibody Screen: NEGATIVE

## 2020-09-10 LAB — CBC
HCT: 44.9 % (ref 39.0–52.0)
Hemoglobin: 15.8 g/dL (ref 13.0–17.0)
MCH: 30.2 pg (ref 26.0–34.0)
MCHC: 35.2 g/dL (ref 30.0–36.0)
MCV: 85.9 fL (ref 80.0–100.0)
Platelets: 85 10*3/uL — ABNORMAL LOW (ref 150–400)
RBC: 5.23 MIL/uL (ref 4.22–5.81)
RDW: 16.2 % — ABNORMAL HIGH (ref 11.5–15.5)
WBC: 5 10*3/uL (ref 4.0–10.5)
nRBC: 0 % (ref 0.0–0.2)

## 2020-09-10 LAB — COMPREHENSIVE METABOLIC PANEL
ALT: 29 U/L (ref 0–44)
AST: 42 U/L — ABNORMAL HIGH (ref 15–41)
Albumin: 4 g/dL (ref 3.5–5.0)
Alkaline Phosphatase: 59 U/L (ref 38–126)
Anion gap: 9 (ref 5–15)
BUN: 18 mg/dL (ref 6–20)
CO2: 23 mmol/L (ref 22–32)
Calcium: 8.9 mg/dL (ref 8.9–10.3)
Chloride: 105 mmol/L (ref 98–111)
Creatinine, Ser: 0.85 mg/dL (ref 0.61–1.24)
GFR, Estimated: >60 mL/min (ref >60)
Glucose, Bld: 168 mg/dL — ABNORMAL HIGH (ref 70–99)
Potassium: 3.7 mmol/L (ref 3.5–5.1)
Sodium: 137 mmol/L (ref 135–145)
Total Bilirubin: 2.9 mg/dL — ABNORMAL HIGH (ref 0.3–1.2)
Total Protein: 6.5 g/dL (ref 6.5–8.1)

## 2020-09-10 LAB — PROTIME-INR
INR: 1.3 — ABNORMAL HIGH (ref 0.8–1.2)
Prothrombin Time: 15.8 seconds — ABNORMAL HIGH (ref 11.4–15.2)

## 2020-09-10 MED ORDER — IOHEXOL 350 MG/ML SOLN
100.0000 mL | Freq: Once | INTRAVENOUS | Status: AC | PRN
  Administered 2020-09-10: 100 mL via INTRAVENOUS

## 2020-09-10 MED ORDER — ONDANSETRON 4 MG PO TBDP: 4.0000 mg | ORAL_TABLET | Freq: Three times a day (TID) | ORAL | 0 refills | Status: DC | PRN

## 2020-09-10 MED ORDER — ONDANSETRON HCL 4 MG/2ML IJ SOLN
4.0000 mg | Freq: Once | INTRAMUSCULAR | Status: AC
  Administered 2020-09-10: 4 mg via INTRAVENOUS
  Filled 2020-09-10: qty 2

## 2020-09-10 NOTE — ED Notes (Signed)
First Nurse Note: Pt to ED via POV for rectal bleeding and abdominal pain. Pt is in NAD. Pt states that his GI doctor told him to come in.

## 2020-09-10 NOTE — ED Notes (Signed)
Assisted MD with rectal exam. Patient tolerated well.

## 2020-09-10 NOTE — ED Provider Notes (Addendum)
[facility] Emergency Department Provider Note ____________________________________________   Event Date/Time   First MD Initiated Contact with Patient 09/10/20 1305     (approximate)  I have reviewed the triage vital signs and the nursing notes.  HISTORY  Chief Complaint Rectal Bleeding   HPI Juan Garza is a 60 y.o. malewho presents to the ED for evaluation of abd pain and rectal bleeding.   Chart review indicates hx obesity, htn, dm, cirrhosis and cholelithiasis.  Last colonoscopy was about 9 months ago with multiple polyps that were removed.  No noted diverticular disease.  Patient presents to the ED for evaluation of about 24 hours of intermittent left-sided abdominal pain and 2 episodes of rectal bleeding.  He reports intermittent sharp pains to his left-sided mid abdomen with associated nausea but no emesis.  Further reports 2 episodes of bloody stools with red blood around them.  Denies diarrhea.  Denies fevers, emesis, dysuria.  He reports the pain has improved somewhat now but has persistent nausea.   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   History of lumbar spinal fusion 01/03/2019   Left leg weakness 01/03/2019   Hair loss 04/16/2016   Severe obesity (BMI 35.0-39.9) with comorbidity ([place]) 04/16/2016   Chronic daily headache 11/13/2015   Facial weakness 11/03/2015   Low back pain radiating down leg 07/30/2015   Left lumbar  radiculopathy 05/30/2015   Hyperlipidemia associated with type 2 diabetes mellitus ([place]) 01/23/2014   Hyperlipidemia with target LDL less than 70 01/23/2014   Tremor 01/23/2014   Dry eye of right side 10/29/2013   Diabetic polyneuropathy associated with type 2 diabetes mellitus ([place]) 04/23/2013   Elevated transaminase level 04/23/2013   Essential hypertension 01/12/2013   Type 2 diabetes mellitus with peripheral neuropathy ([place]) 10/03/2012   GERD (gastroesophageal reflux disease) 09/04/2012   Gout 09/04/2012   Migraine without aura and without status migrainosus, not intractable 09/04/2012   Obstructive sleep apnea on CPAP 09/04/2012   Primary insomnia 09/04/2012   S/P total knee replacement, left 09/04/2012    Past Surgical History:  Procedure Laterality Date   ABDOMINAL SURGERY     APPENDECTOMY     BACK SURGERY     posteriior lumbar spine fusion   CARDIAC CATHETERIZATION     CARDIAC VALVE REPLACEMENT     colectomy partial w/anastoamosis     COLONOSCOPY WITH PROPOFOL N/A 01/22/2020   Procedure: COLONOSCOPY WITH PROPOFOL;  Surgeon: Lesly Rubenstein, MD;  Location: [facility] ENDOSCOPY;  Service: Endoscopy;  Laterality: N/A;   ESOPHAGOGASTRODUODENOSCOPY (EGD) WITH PROPOFOL N/A 01/22/2020   Procedure: ESOPHAGOGASTRODUODENOSCOPY (EGD) WITH PROPOFOL;  Surgeon: Lesly Rubenstein, MD;  Location: [facility] ENDOSCOPY;  Service: Endoscopy;  Laterality: N/A;   HERNIA REPAIR     JOINT REPLACEMENT     KNEE SURGERY Left    NASAL SINUS SURGERY      Prior to Admission medications   Medication Sig Start Date End Date Taking? Authorizing Provider  acetaminophen (TYLENOL) 500 MG tablet  Take 500 mg by mouth every 6 (six) hours as needed. Patient not taking: Reported on 06/24/2020    [provider]  ascorbic acid (VITAMIN C) 500 MG tablet Take by mouth. Patient not taking: Reported on 06/24/2020    [provider]  atorvastatin (LIPITOR) 10 MG tablet Take 10 mg by mouth daily.     [provider]  CALCIUM-MAGNESUIUM-ZINC 333-133-8.3 MG TABS Take by mouth. Patient not taking: Reported on 06/24/2020    [provider]  ciprofloxacin (CIPRO) 500 MG tablet Take 500 mg by mouth 2 (two) times daily. 05/13/20   [provider]  Cyanocobalamin 1000 MCG/ML LIQD Take by mouth.    [provider]  diclofenac Sodium (VOLTAREN) 1 % GEL Apply topically. 11/03/17   [provider]  dutasteride (AVODART) 0.5 MG capsule Take by mouth daily. 12/18/18   [provider]  empagliflozin (JARDIANCE) 10 MG TABS tablet Take 25 mg by mouth daily.    [provider]  eszopiclone (LUNESTA) 2 MG TABS tablet Take by mouth as needed. 04/26/19   [provider]  lisinopril (ZESTRIL) 10 MG tablet Take 10 mg by mouth daily.    [provider]  methocarbamol (ROBAXIN) 750 MG tablet Take by mouth. 04/08/20   [provider]  metroNIDAZOLE (FLAGYL) 500 MG tablet Take 500 mg by mouth every 8 (eight) hours. 05/13/20   [provider]  Multiple Vitamin (MULTI-VITAMIN) tablet Take 1 tablet by mouth daily.    [provider]  ondansetron (ZOFRAN) 8 MG tablet Take by mouth. Patient not taking: Reported on 06/24/2020 04/11/19   [provider]  pregabalin (LYRICA) 75 MG capsule Take 75 mg by mouth. Takes '150mg'$  in am and '75mg'$  at night 01/18/19   [provider]  propranolol (INDERAL) 20 MG tablet Take 20 mg by mouth 2 (two) times daily.    [provider]  Semaglutide,0.25 or 0.'5MG'$ /DOS, (OZEMPIC, 0.25 OR 0.5 MG/DOSE,) 2 MG/1.5ML SOPN Inject into the skin. Takes every 7 days 05/22/19   [provider]  sucralfate (CARAFATE) 1 g tablet Take by mouth 2 (two) times daily. 01/11/20 02/18/21  [provider]  gabapentin (NEURONTIN) 300 MG capsule Take 300 mg by mouth at bedtime.  08/08/19  [provider]    Allergies Imitrex [sumatriptan], Seroquel [quetiapine fumarate], and  Adhesive [tape]  Family History  Problem Relation Age of Onset   Atrial fibrillation Mother    Diabetes Father     Social History Social History   Tobacco Use   Smoking status: Former   Smokeless tobacco: Never  Scientific laboratory technician Use: Never used  Substance Use Topics   Alcohol use: Yes    Comment: none last 24hrs   Drug use: Never    Review of Systems  Constitutional: No fever/chills Eyes: No visual changes. ENT: No sore throat. Cardiovascular: Denies chest pain. Respiratory: Denies shortness of breath. Gastrointestinal: Positive for abdominal pain, nausea and bloody stool  no vomiting.  No diarrhea.  No constipation. Genitourinary: Negative for dysuria. Musculoskeletal: Negative for back pain. Skin: Negative for rash. Neurological: Negative for headaches, focal weakness or numbness.  ____________________________________________   PHYSICAL EXAM:  VITAL SIGNS: Vitals:   09/10/20 1319 09/10/20 1322  BP: 137/74 134/74  Pulse: 75 67  Resp:  18  Temp:  98 F (36.7 C)  SpO2:  100%    Constitutional: Alert and oriented. Well appearing and in no acute distress. Eyes: Conjunctivae are normal. PERRL. EOMI. Head:  Atraumatic. Nose: No congestion/rhinnorhea. Mouth/Throat: Mucous membranes are moist.  Oropharynx non-erythematous. Neck: No stridor. No cervical spine tenderness to palpation. Cardiovascular: Normal rate, regular rhythm. Grossly normal heart sounds.  Good peripheral circulation. Respiratory: Normal respiratory effort.  No retractions. Lungs CTAB. Gastrointestinal: Soft , nondistended. No CVA tenderness. Tender to palpation to LLQ and left-sided mid abdomen with some voluntary guarding.  Benign right-sided abdomen.  No tenderness to epigastrium or LUQ. Rectal: Exam chaperoned by Tomie China.  Normal-appearing external examination without evidence of external hemorrhoids, active bleeding or anal fissures.  DRE with brown stool that is Hemoccult  positive. Musculoskeletal: No lower extremity tenderness nor edema.  No joint effusions. No signs of acute trauma. Neurologic:  Normal speech and language. No gross focal neurologic deficits are appreciated. No gait instability noted. Skin:  Skin is warm, dry and intact. No rash noted. Psychiatric: Mood and affect are normal. Speech and behavior are normal. ____________________________________________   LABS (all labs ordered are listed, but only abnormal results are displayed)  Labs Reviewed  COMPREHENSIVE METABOLIC PANEL - Abnormal; Notable for the following components:      Result Value   Glucose, Bld 168 (*)    AST 42 (*)    Total Bilirubin 2.9 (*)    All other components within normal limits  CBC - Abnormal; Notable for the following components:   RDW 16.2 (*)    Platelets 85 (*)    All other components within normal limits  PROTIME-INR - Abnormal; Notable for the following components:   Prothrombin Time 15.8 (*)    INR 1.3 (*)    All other components within normal limits  POC OCCULT BLOOD, ED  TYPE AND SCREEN   ____________________________________________  12 Lead EKG  Sinus rhythm, rate of 75 bpm.  Normal axis and intervals.  No evidence of acute ischemia. ____________________________________________  RADIOLOGY  ED MD interpretation: CT pending at the time of signout to oncoming provider  Official radiology report(s): No results found.  ____________________________________________   PROCEDURES and INTERVENTIONS  Procedure(s) performed (including Critical Care):  .1-3 Lead EKG Interpretation  Date/Time: 09/10/2020 3:32 PM Performed by: Vladimir Crofts, MD Authorized by: Vladimir Crofts, MD     Interpretation: normal     ECG rate:  68   ECG rate assessment: normal     Rhythm: sinus rhythm     Ectopy: none     Conduction: normal    Medications  ondansetron (ZOFRAN) injection 4 mg (4 mg Intravenous Given 09/10/20 1420)  iohexol (OMNIPAQUE) 350 MG/ML injection  100 mL (100 mLs Intravenous Contrast Given 09/10/20 1454)    ____________________________________________   MDM / ED COURSE   Known NASH cirrhotic this to the ED with a couple days of left-sided abdominal pain and rectal bleeding, requiring CT imaging.  Normal vitals.  Exam with left-sided tenderness and Hemoccult positive brown stool, most likely representing diverticulitis.  Blood work is unremarkable for acute features.  CT pending the time of signout to assess for associated complications such as abscess or perforation, or additional pathology such as SBO which is less likely.  If uncomplicated diverticulitis, would be suitable for outpatient management with GI follow-up.  CT results prior to my leaving.  I reassessed the patient and he has a benign abdomen.  We discussed mild inflammation around his rectum and prostate and the possible etiologies of this.  He again denies any urinary symptoms.  He reports that he feels much better now.  We discussed management and return precautions for  the ED.  Patient suitable for outpatient management.  We discussed follow-up with GI.  Clinical Course as of 09/10/20 1601  Wed Sep 10, 2020  1427 Rectal exam performed by me, chaperoned by [facility]. Small Amount of brown stool with Hemoccult positive [DS]  [id number] Shared decision making with the patient about CT imaging yields plan to go home with conservative measures with Tylenol and Zofran with return precautions for the ED as he is eager to be with family at [place] current beach [place] trip.  We discussed return precautions [DS]    Clinical Course User Index [DS] Vladimir Crofts, MD    ____________________________________________   FINAL CLINICAL IMPRESSION(S) / ED DIAGNOSES  Final diagnoses:  Rectal bleeding  Left lower quadrant abdominal pain     ED Discharge Orders     None        Buel Molder Tamala Julian   Note:  This document was prepared using Dragon voice recognition software and may include  unintentional dictation errors.    Vladimir Crofts, MD 09/10/20 [place], [place], MD 09/10/20 [phone]

## 2020-09-10 NOTE — ED Triage Notes (Signed)
Pt to ED for rectal bleeding since yesterday. Denies dizziness. Blood dark red.  Reports pain to LUQ

## 2020-09-12 DIAGNOSIS — K7460 Unspecified cirrhosis of liver: Principal | ICD-10-CM

## 2020-09-23 ENCOUNTER — Other Ambulatory Visit: Payer: Self-pay

## 2020-09-23 ENCOUNTER — Inpatient Hospital Stay: Payer: BC Managed Care – PPO | Attending: Internal Medicine

## 2020-09-23 ENCOUNTER — Encounter: Payer: Self-pay | Admitting: Hematology and Oncology

## 2020-09-23 DIAGNOSIS — D696 Thrombocytopenia, unspecified: Secondary | ICD-10-CM | POA: Diagnosis not present

## 2020-09-23 DIAGNOSIS — D509 Iron deficiency anemia, unspecified: Secondary | ICD-10-CM | POA: Insufficient documentation

## 2020-09-23 DIAGNOSIS — R161 Splenomegaly, not elsewhere classified: Secondary | ICD-10-CM | POA: Insufficient documentation

## 2020-09-23 DIAGNOSIS — Z87891 Personal history of nicotine dependence: Secondary | ICD-10-CM | POA: Diagnosis not present

## 2020-09-23 LAB — CBC WITH DIFFERENTIAL/PLATELET
Abs Immature Granulocytes: 0.01 10*3/uL (ref 0.00–0.07)
Basophils Absolute: 0 10*3/uL (ref 0.0–0.1)
Basophils Relative: 1 %
Eosinophils Absolute: 0.1 10*3/uL (ref 0.0–0.5)
Eosinophils Relative: 1 %
HCT: 43.8 % (ref 39.0–52.0)
Hemoglobin: 15.1 g/dL (ref 13.0–17.0)
Immature Granulocytes: 0 %
Lymphocytes Relative: 29 %
Lymphs Abs: 1.1 10*3/uL (ref 0.7–4.0)
MCH: 30.1 pg (ref 26.0–34.0)
MCHC: 34.5 g/dL (ref 30.0–36.0)
MCV: 87.3 fL (ref 80.0–100.0)
Monocytes Absolute: 0.4 10*3/uL (ref 0.1–1.0)
Monocytes Relative: 10 %
Neutro Abs: 2.3 10*3/uL (ref 1.7–7.7)
Neutrophils Relative %: 59 %
Platelets: 63 10*3/uL — ABNORMAL LOW (ref 150–400)
RBC: 5.02 MIL/uL (ref 4.22–5.81)
RDW: 16.1 % — ABNORMAL HIGH (ref 11.5–15.5)
WBC: 3.9 10*3/uL — ABNORMAL LOW (ref 4.0–10.5)
nRBC: 0 % (ref 0.0–0.2)

## 2020-09-23 LAB — COMPREHENSIVE METABOLIC PANEL WITH GFR
ALT: 35 U/L (ref 0–44)
AST: 39 U/L (ref 15–41)
Albumin: 3.8 g/dL (ref 3.5–5.0)
Alkaline Phosphatase: 66 U/L (ref 38–126)
Anion gap: 8 (ref 5–15)
BUN: 14 mg/dL (ref 6–20)
CO2: 27 mmol/L (ref 22–32)
Calcium: 9 mg/dL (ref 8.9–10.3)
Chloride: 102 mmol/L (ref 98–111)
Creatinine, Ser: 0.84 mg/dL (ref 0.61–1.24)
GFR, Estimated: >60 mL/min
Glucose, Bld: 172 mg/dL — ABNORMAL HIGH (ref 70–99)
Potassium: 3.7 mmol/L (ref 3.5–5.1)
Sodium: 137 mmol/L (ref 135–145)
Total Bilirubin: 2.7 mg/dL — ABNORMAL HIGH (ref 0.3–1.2)
Total Protein: 6.4 g/dL — ABNORMAL LOW (ref 6.5–8.1)

## 2020-09-23 LAB — IRON AND TIBC
Iron: 123 ug/dL (ref 45–182)
Saturation Ratios: 29 % (ref 17.9–39.5)
TIBC: 421 ug/dL (ref 250–450)
UIBC: 298 ug/dL

## 2020-09-23 LAB — FERRITIN: Ferritin: 51 ng/mL (ref 24–336)

## 2020-09-26 ENCOUNTER — Encounter: Payer: Self-pay | Admitting: Nurse Practitioner

## 2020-09-26 ENCOUNTER — Inpatient Hospital Stay: Payer: BC Managed Care – PPO

## 2020-09-26 ENCOUNTER — Inpatient Hospital Stay (HOSPITAL_BASED_OUTPATIENT_CLINIC_OR_DEPARTMENT_OTHER): Payer: BC Managed Care – PPO | Admitting: Nurse Practitioner

## 2020-09-26 ENCOUNTER — Other Ambulatory Visit: Payer: Self-pay

## 2020-09-26 VITALS — BP 139/79 | HR 71 | Temp 97.2°F | Resp 18 | Wt 272.5 lb

## 2020-09-26 DIAGNOSIS — D509 Iron deficiency anemia, unspecified: Secondary | ICD-10-CM | POA: Diagnosis not present

## 2020-09-26 NOTE — Progress Notes (Signed)
[facility]  [address] [place], [place] [address] Phone: [phone]  Fax: [phone]   Clinic Day: 09/26/20  Referring physician: Ezequiel Kayser, MD  Chief Complaint: Juan Garza is a 60 y.o. male with iron deficiency anemia, thrombocytopenia, portal hypertension, and a possible low grade myelodysplastic syndrome who is seen for follow up   HPI:  Juan Garza is a 60 y.o. male with leukopenia and thrombocytopenia.  Bone marrow on 12/17/2019 revealed a hypercellular marrow with dysmegakaryocytopoiesis. The features were suggestive of a low-grade myelodysplastic syndrome. Cytogenetics were normal (46, XY).  Bone marrow review at [facility] did not reveal sufficient dysplasia to meet diagnostic criteria for a myelodysplastic syndrome.  The minimal dysplasia, leukopenia and thrombocytopenia in the marrow may be the result of reactive changes.  Early low-grade myelodysplastic syndrome could not be ruled out.  CBC on 11/14/2019 revealed a hematocrit 43.3, hemoglobin 14.2, MCV 85.9, platelets 77,000, WBC 3,600 (ANC 2,000).  SPEP was normal on 10/17/2019. Vitamin B12 was 838 on 10/17/2019.  TSH and free T4 were normal on 04/16/2019.  Work-up on 11/27/2019 revealed a hematocrit of 47.5, hemoglobin 15.3, platelets 82,000, WBC 5,000. Platelet count in a citrate was 81,400.  AST was 45, ALT 45, and bilirubin 1.8. Normal labs included PT (31), PT (14.1), LDH (176),  copper (78), TSH (1.579), free T4 (0.70), folate (6.8), ANA, H pylori antibody, HIV antibody, hepatitis B core antibody, and hepatitis C antibody were negative.  Immature platelet fraction was 7.9% (normal) on 12/11/2019.   Peripheral smear revealed mild thrombocytopenia without clumping, which has been persistent. RBCs were normochromic, with mild anisocytosis. There was no poikilocytosis or fragmentation.  There were no abnormal or immature leukocytes were identified.  Abdominal ultrasound on 12/07/2019  revealed diffusely increased echotexture throughout the liver c/w fatty infiltration. Portal vein was patent on Doppler with normal direction of blood flow toward the liver.  There was splenomegaly (13.8 cm). There was cholelithiasis. There was no evidence of acute cholecystitis.  Abdomen and pelvis CT on 01/02/2020 revealed morphologic changes to the liver raising the possibility of cirrhosis. There were sequelae of portal venous hypertension including splenomegaly (15.5 cm) and upper abdominal collateral vessels. There was cholelithiasis. There was mild gallbladder wall thickening which may be secondary to portal venous hypertension/cirrhosis.  AFP was 3.4 on 12/19/2019.  EGD on 01/22/2020 revealed grade II esophageal varices, gastritis, and portal hypertensive gastropathy. Colonoscopy on 01/22/2020 revealed one 2 mm polyp in the transverse colon, one 3 mm polyp in the transverse colon, and two 3 to 7 mm polyps in the descending colon (3 tubular adenomas, one hyperplastic polyp).  He has iron deficiency.  He has a history of upper GI bleed felt related to ibuprofen in 06/2019. Ferritin was 9 on 05/28/2019 and 19 on 10/17/2019. Iron saturation was 6% and TIBC was 467 on 05/28/2019. Reticulocyte count was 1.34% on 10/17/2019. Sed rate was 2 and CRP was 1 on 11/14/2019.  At home hemoccult test was positive on 10/25/2019.  The patient received the [place] COVID-19 vaccine on 05/02/2019 and 05/23/2019.  Interval History: Patient returns to clinic for labs, follow-up post IV iron.  He has started a third shift job and has some fatigue and weight is up.  He has an appointment to be seen at the liver clinic at [facility] next month.  He continues to have cramping of the legs.  Does not feel much different since receiving iron but was pleased with his blood work when he saw it  on MyChart.  Overall feels stable.  No swelling, shortness of breath.  Denies neurologic complaints.  No recent fevers or infections.  No melena or  hematochezia.  Appetite is stable.  Chronic abdominal distention.  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   History of lumbar spinal fusion 01/03/2019   Left leg weakness 01/03/2019   Hair loss 04/16/2016   Severe obesity (BMI 35.0-39.9) with comorbidity ([place]) 04/16/2016   Facial weakness 11/03/2015   Low back pain radiating down leg 07/30/2015   Left lumbar radiculopathy 05/30/2015   Hyperlipidemia associated with type 2 diabetes mellitus ([place]) 01/23/2014   Hyperlipidemia with target LDL less than 70 01/23/2014   Tremor 01/23/2014   Dry eye of right side 10/29/2013   Diabetic polyneuropathy associated with type 2 diabetes mellitus ([place]) 04/23/2013   Elevated transaminase level 04/23/2013   Essential hypertension 01/12/2013   Type 2 diabetes mellitus with peripheral neuropathy ([place]) 10/03/2012   GERD (gastroesophageal reflux disease) 09/04/2012   Gout 09/04/2012   Migraine without aura and without status migrainosus, not intractable 09/04/2012   Obstructive sleep apnea on CPAP 09/04/2012   Primary insomnia 09/04/2012   S/P total knee replacement, left 09/04/2012   Past Medical History:  Diagnosis Date   Chronic back pain    Diabetes mellitus without complication ([place])    Hypertension     Past Surgical History:  Procedure Laterality Date   ABDOMINAL SURGERY     APPENDECTOMY     BACK SURGERY     posteriior lumbar spine fusion   CARDIAC CATHETERIZATION     CARDIAC VALVE REPLACEMENT     colectomy partial w/anastoamosis     COLONOSCOPY WITH PROPOFOL  N/A 01/22/2020   Procedure: COLONOSCOPY WITH PROPOFOL;  Surgeon: Lesly Rubenstein, MD;  Location: [facility] ENDOSCOPY;  Service: Endoscopy;  Laterality: N/A;   ESOPHAGOGASTRODUODENOSCOPY (EGD) WITH PROPOFOL N/A 01/22/2020   Procedure: ESOPHAGOGASTRODUODENOSCOPY (EGD) WITH PROPOFOL;  Surgeon: Lesly Rubenstein, MD;  Location: [facility] ENDOSCOPY;  Service: Endoscopy;  Laterality: N/A;   HERNIA REPAIR     JOINT REPLACEMENT     KNEE SURGERY Left    NASAL SINUS SURGERY      Family History  Problem Relation Age of Onset   Atrial fibrillation Mother    Diabetes Father     Social History:  reports that he has quit smoking. He has never used smokeless tobacco. He reports current alcohol use. He reports that he does not use drugs. He smoked 1.5 packs per day for ~20 years and quit in 1996. He denies drug use. He drinks 3 drinks weekly, usually rum and coke. He worked at a [employer] in his early 41s. He lives in [place]. 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 The patient is alone today.  Allergies:  Allergies  Allergen Reactions   Quetiapine Other (See Comments)    Uncontrolled leg movements Other reaction(s): Other (See Comments), Other (See Comments), Other (See Comments) Leg spasms "uncontrollable leg kicking" Uncontrolled leg movements Leg spasms "uncontrollable leg kicking"   Imitrex [Sumatriptan] Other (See Comments)    shaking  Seroquel [Quetiapine Fumarate] Other (See Comments)    Leg spasms   Adhesive [Tape] Rash    Current Medications: Current Outpatient Medications  Medication Sig Dispense Refill   acetaminophen (TYLENOL) 500 MG tablet Take 500 mg by mouth every 6 (six) hours as needed.     atorvastatin (LIPITOR) 10 MG tablet Take 10 mg by mouth daily.     diclofenac Sodium (VOLTAREN) 1 % GEL Apply topically.     dutasteride (AVODART) 0.5 MG capsule Take by mouth daily.     empagliflozin (JARDIANCE) 10 MG TABS tablet Take 25 mg by mouth daily.     eszopiclone (LUNESTA) 2 MG  TABS tablet Take by mouth as needed.     furosemide (LASIX) 20 MG tablet Take by mouth.     lisinopril (ZESTRIL) 10 MG tablet Take 10 mg by mouth daily.     methocarbamol (ROBAXIN) 750 MG tablet Take by mouth.     Multiple Vitamin (MULTI-VITAMIN) tablet Take 1 tablet by mouth daily.     omeprazole (PRILOSEC) 40 MG capsule Take 40 mg by mouth daily.     ondansetron (ZOFRAN ODT) 4 MG disintegrating tablet Take 1 tablet (4 mg total) by mouth every 8 (eight) hours as needed for nausea or vomiting. 20 tablet 0   potassium chloride (KLOR-CON) 10 MEQ tablet Take by mouth.     pregabalin (LYRICA) 75 MG capsule Take 75 mg by mouth. Takes '150mg'$  in am and '75mg'$  at night     propranolol (INDERAL) 20 MG tablet Take 20 mg by mouth 2 (two) times daily.     Semaglutide,0.25 or 0.'5MG'$ /DOS, (OZEMPIC, 0.25 OR 0.5 MG/DOSE,) 2 MG/1.5ML SOPN Inject into the skin. Takes every 7 days     ascorbic acid (VITAMIN C) 500 MG tablet Take by mouth. (Patient not taking: No sig reported)     CALCIUM-MAGNESUIUM-ZINC 333-133-8.3 MG TABS Take by mouth. (Patient not taking: No sig reported)     ondansetron (ZOFRAN) 8 MG tablet Take by mouth. (Patient not taking: No sig reported)     sucralfate (CARAFATE) 1 g tablet Take by mouth 2 (two) times daily. (Patient not taking: Reported on 09/26/2020)     No current facility-administered medications for this visit.     Review of Systems  Constitutional:  Negative for chills, fever, malaise/fatigue and weight loss.  HENT:  Negative for hearing loss, nosebleeds, sore throat and tinnitus.   Eyes:  Negative for blurred vision and double vision.  Respiratory:  Negative for cough, hemoptysis, shortness of breath and wheezing.   Cardiovascular:  Negative for chest pain, palpitations and leg swelling.  Gastrointestinal:  Negative for abdominal pain, blood in stool, constipation, diarrhea, melena, nausea and vomiting.  Genitourinary:  Negative for dysuria and urgency.  Musculoskeletal:  Negative  for back pain, falls, joint pain and myalgias.  Skin:  Negative for itching and rash.  Neurological:  Negative for dizziness, tingling, sensory change, loss of consciousness, weakness and headaches.  Endo/Heme/Allergies:  Negative for environmental allergies. Does not bruise/bleed easily.  Psychiatric/Behavioral:  Negative for depression. The patient is not nervous/anxious and does not have insomnia.   Performance status (ECOG): 1  Vitals Blood pressure 139/79, pulse 71, temperature (!) 97.2 F (36.2 C), resp. rate 18, weight 272 lb 7.8 oz (123.6 kg), SpO2 100 %.   General: Well-developed, well-nourished, no acute distress. Eyes: Pink conjunctiva, anicteric sclera. Lungs: Clear to auscultation bilaterally.  No audible wheezing or coughing Heart: Regular rate and rhythm.  Abdomen: distended/rounded.   Musculoskeletal:  No edema, cyanosis, or clubbing. Neuro: Alert, answering all questions appropriately. Cranial nerves grossly intact. Skin: No rashes or petechiae noted. Psych: Normal affect.    No visits with results within 3 Day(s) from this visit.  Latest known visit with results is:  Appointment on 09/23/2020  Component Date Value Ref Range Status   Ferritin 09/23/2020 51  24 - 336 ng/mL Final   Performed at [facility], [address] [place], [place] [address]   Iron 09/23/2020 123  45 - 182 ug/dL Final   TIBC 09/23/2020 421  250 - 450 ug/dL Final   Saturation Ratios 09/23/2020 29  17.9 - 39.5 % Final   UIBC 09/23/2020 298  ug/dL Final   Performed at [facility], [place], [place] [address]   Sodium 09/23/2020 137  135 - 145 mmol/L Final   Potassium 09/23/2020 3.7  3.5 - 5.1 mmol/L Final   Chloride 09/23/2020 102  98 - 111 mmol/L Final   CO2 09/23/2020 27  22 - 32 mmol/L Final   Glucose, Bld 09/23/2020 172 (A) 70 - 99 mg/dL Final   Glucose reference range applies only to samples taken after fasting for at least 8 hours.   BUN 09/23/2020  14  6 - 20 mg/dL Final   Creatinine, Ser 09/23/2020 0.84  0.61 - 1.24 mg/dL Final   Calcium 09/23/2020 9.0  8.9 - 10.3 mg/dL Final   Total Protein 09/23/2020 6.4 (A) 6.5 - 8.1 g/dL Final   Albumin 09/23/2020 3.8  3.5 - 5.0 g/dL Final   AST 09/23/2020 39  15 - 41 U/L Final   ALT 09/23/2020 35  0 - 44 U/L Final   Alkaline Phosphatase 09/23/2020 66  38 - 126 U/L Final   Total Bilirubin 09/23/2020 2.7 (A) 0.3 - 1.2 mg/dL Final   GFR, Estimated 09/23/2020 >60  >60 mL/min Final   Comment: (NOTE) Calculated using the CKD-EPI Creatinine Equation (2021)    Anion gap 09/23/2020 8  5 - 15 Final   Performed at [facility], [address], [place], [place] [address]   WBC 09/23/2020 3.9 (A) 4.0 - 10.5 K/uL Final   RBC 09/23/2020 5.02  4.22 - 5.81 MIL/uL Final   Hemoglobin 09/23/2020 15.1  13.0 - 17.0 g/dL Final   HCT 09/23/2020 43.8  39.0 - 52.0 % Final   MCV 09/23/2020 87.3  80.0 - 100.0 fL Final   MCH 09/23/2020 30.1  26.0 - 34.0 pg Final   MCHC 09/23/2020 34.5  30.0 - 36.0 g/dL Final   RDW 09/23/2020 16.1 (A) 11.5 - 15.5 % Final   Platelets 09/23/2020 63 (A) 150 - 400 K/uL Final   Comment: Immature Platelet Fraction may be clinically indicated, consider ordering this additional test [id number]    nRBC 09/23/2020 0.0  0.0 - 0.2 % Final   Neutrophils Relative % 09/23/2020 59  % Final   Neutro Abs 09/23/2020 2.3  1.7 - 7.7 K/uL Final   Lymphocytes Relative 09/23/2020 29  % Final   Lymphs Abs 09/23/2020 1.1  0.7 - 4.0 K/uL Final   Monocytes Relative 09/23/2020 10  % Final   Monocytes Absolute 09/23/2020 0.4  0.1 - 1.0 K/uL Final   Eosinophils Relative 09/23/2020 1  % Final   Eosinophils Absolute 09/23/2020 0.1  0.0 - 0.5 K/uL Final   Basophils Relative 09/23/2020 1  % Final   Basophils Absolute 09/23/2020 0.0  0.0 - 0.1 K/uL Final   Immature Granulocytes 09/23/2020 0  %  Final   Abs Immature Granulocytes 09/23/2020 0.01  0.00 - 0.07 K/uL Final   Performed at [facility], [address] [place], [place] [address]     Assessment:  1.   Leukopenia and thrombocytopenia- Bone marrow on 12/16/2020 revealed features suggestive of a low grade myelodysplastic syndrome. FISH studies not performed. Reviewed at [facility] and not sufficient dysplasia to meet diagnostic criteria for MDS; possibly reactive. Early low-grade myelodysplastic syndrome could not be ruled out. Labs today reviewed. Etiology of low counts may in part be secondary to cirrhosis and splenomegaly. Discussed plans for a repeat bone marrow if he develops grade 3 neutropenia (ANC < 1000) or thrombocytopenia (platelets < 50,000). Thrombocytopenia likely related to cirrhosis (see below).   2.   Iron deficiency- EGD on 01/22/2020 revealed grade II esophageal varices, gastritis, and portal hypertensive gastropathy. Colonoscopy on 01/22/2020 revealed 4 polyps (3 tubular adenomas, one hyperplastic polyp). Iron saturation worse at 9%. No response from oral iron. Recommend IV iron. Now s/p IV venofer x 4. Labs today reviewed and overall improved. No iron today. Monitor.   3.   Portal hypertension- Abdomen and pelvis CT on 01/02/2020 revealed morphologic changes to the liver suggestive of cirrhosis. There were sequelae of portal venous hypertension including splenomegaly (15.5 cm) and upper abdominal collateral vessels. Follow up with PCP.   4. Cirrhosis- with associated thrombocytopenia. LFTs minimally elevated. AFP and imaging performed with GI. [facility].   Plan: RTC in 3 months for labs (cbc, cmp, ferritin, iron studies), then few days later MD to establish care  I discussed the assessment and treatment plan with the patient.  The patient was provided an opportunity to ask questions and all were answered.  The patient agreed with the plan and demonstrated an understanding of the instructions.  The patient was advised to call back if the symptoms worsen or if the condition fails to improve as  anticipated.  Beckey Rutter, DNP, AGNP-C [place] at [facility]

## 2020-10-27 ENCOUNTER — Ambulatory Visit: Admit: 2020-10-27 | Discharge: 2020-10-28 | Payer: PRIVATE HEALTH INSURANCE

## 2020-10-27 DIAGNOSIS — K7581 Nonalcoholic steatohepatitis (NASH): Principal | ICD-10-CM

## 2020-10-27 DIAGNOSIS — R197 Diarrhea, unspecified: Principal | ICD-10-CM

## 2020-10-27 DIAGNOSIS — K7460 Unspecified cirrhosis of liver: Principal | ICD-10-CM

## 2020-10-27 DIAGNOSIS — R140 Abdominal distension (gaseous): Principal | ICD-10-CM

## 2020-10-27 DIAGNOSIS — I8510 Secondary esophageal varices without bleeding: Principal | ICD-10-CM

## 2020-11-03 ENCOUNTER — Encounter: Payer: Self-pay | Admitting: Emergency Medicine

## 2020-11-03 ENCOUNTER — Emergency Department: Payer: BC Managed Care – PPO

## 2020-11-03 ENCOUNTER — Other Ambulatory Visit: Payer: Self-pay | Admitting: Ophthalmology

## 2020-11-03 ENCOUNTER — Emergency Department
Admission: EM | Admit: 2020-11-03 | Discharge: 2020-11-04 | Disposition: A | Discharge status: Home / Self Care | Payer: BC Managed Care – PPO | Attending: Emergency Medicine | Admitting: Emergency Medicine

## 2020-11-03 ENCOUNTER — Ambulatory Visit
Admission: EM | Admit: 2020-11-03 | Discharge: 2020-11-03 | Disposition: A | Discharge status: Home / Self Care | Payer: BC Managed Care – PPO

## 2020-11-03 ENCOUNTER — Other Ambulatory Visit: Payer: Self-pay

## 2020-11-03 DIAGNOSIS — I10 Essential (primary) hypertension: Secondary | ICD-10-CM | POA: Insufficient documentation

## 2020-11-03 DIAGNOSIS — Z87891 Personal history of nicotine dependence: Secondary | ICD-10-CM | POA: Insufficient documentation

## 2020-11-03 DIAGNOSIS — E1169 Type 2 diabetes mellitus with other specified complication: Secondary | ICD-10-CM | POA: Insufficient documentation

## 2020-11-03 DIAGNOSIS — E785 Hyperlipidemia, unspecified: Secondary | ICD-10-CM | POA: Insufficient documentation

## 2020-11-03 DIAGNOSIS — H5711 Ocular pain, right eye: Secondary | ICD-10-CM | POA: Diagnosis not present

## 2020-11-03 DIAGNOSIS — H4901 Third [oculomotor] nerve palsy, right eye: Secondary | ICD-10-CM

## 2020-11-03 DIAGNOSIS — Z79899 Other long term (current) drug therapy: Secondary | ICD-10-CM | POA: Diagnosis not present

## 2020-11-03 DIAGNOSIS — H539 Unspecified visual disturbance: Secondary | ICD-10-CM | POA: Diagnosis not present

## 2020-11-03 DIAGNOSIS — Z96652 Presence of left artificial knee joint: Secondary | ICD-10-CM | POA: Diagnosis not present

## 2020-11-03 LAB — BASIC METABOLIC PANEL
Anion gap: 6 (ref 5–15)
BUN: 13 mg/dL (ref 6–20)
CO2: 25 mmol/L (ref 22–32)
Calcium: 8.9 mg/dL (ref 8.9–10.3)
Chloride: 105 mmol/L (ref 98–111)
Creatinine, Ser: 0.65 mg/dL (ref 0.61–1.24)
GFR, Estimated: >60 mL/min (ref >60)
Glucose, Bld: 190 mg/dL — ABNORMAL HIGH (ref 70–99)
Potassium: 3.7 mmol/L (ref 3.5–5.1)
Sodium: 136 mmol/L (ref 135–145)

## 2020-11-03 LAB — CBC WITH DIFFERENTIAL/PLATELET
Abs Immature Granulocytes: 0.02 10*3/uL (ref 0.00–0.07)
Basophils Absolute: 0 10*3/uL (ref 0.0–0.1)
Basophils Relative: 1 %
Eosinophils Absolute: 0 10*3/uL (ref 0.0–0.5)
Eosinophils Relative: 1 %
HCT: 45.2 % (ref 39.0–52.0)
Hemoglobin: 15.7 g/dL (ref 13.0–17.0)
Immature Granulocytes: 1 %
Lymphocytes Relative: 23 %
Lymphs Abs: 0.8 10*3/uL (ref 0.7–4.0)
MCH: 30.2 pg (ref 26.0–34.0)
MCHC: 34.7 g/dL (ref 30.0–36.0)
MCV: 86.9 fL (ref 80.0–100.0)
Monocytes Absolute: 0.2 10*3/uL (ref 0.1–1.0)
Monocytes Relative: 6 %
Neutro Abs: 2.4 10*3/uL (ref 1.7–7.7)
Neutrophils Relative %: 68 %
Platelets: 56 10*3/uL — ABNORMAL LOW (ref 150–400)
RBC: 5.2 MIL/uL (ref 4.22–5.81)
RDW: 14.1 % (ref 11.5–15.5)
WBC: 3.4 10*3/uL — ABNORMAL LOW (ref 4.0–10.5)
nRBC: 0 % (ref 0.0–0.2)

## 2020-11-03 MED ORDER — MIDAZOLAM HCL 5 MG/5ML IJ SOLN
1.0000 mg | Freq: Once | INTRAMUSCULAR | Status: DC | PRN
Start: 2020-11-03 — End: 2020-11-03

## 2020-11-03 MED ORDER — METOCLOPRAMIDE HCL 5 MG/ML IJ SOLN
10.0000 mg | Freq: Once | INTRAMUSCULAR | Status: AC
  Administered 2020-11-03: 10 mg via INTRAVENOUS
  Filled 2020-11-03: qty 2

## 2020-11-03 MED ORDER — ACETAMINOPHEN 325 MG PO TABS
650.0000 mg | ORAL_TABLET | Freq: Once | ORAL | Status: AC
  Administered 2020-11-03: 650 mg via ORAL
  Filled 2020-11-03: qty 2

## 2020-11-03 MED ORDER — DIPHENHYDRAMINE HCL 50 MG/ML IJ SOLN
25.0000 mg | Freq: Once | INTRAMUSCULAR | Status: AC
  Administered 2020-11-03: 25 mg via INTRAVENOUS
  Filled 2020-11-03: qty 1

## 2020-11-03 MED ORDER — MIDAZOLAM HCL 5 MG/5ML IJ SOLN: 2.0000 mg | Freq: Once | INTRAMUSCULAR | Status: DC | PRN

## 2020-11-03 MED ORDER — IOHEXOL 350 MG/ML SOLN
75.0000 mL | Freq: Once | INTRAVENOUS | Status: AC | PRN
  Administered 2020-11-03: 75 mL via INTRAVENOUS

## 2020-11-03 MED ORDER — GADOBUTROL 1 MMOL/ML IV SOLN
10.0000 mL | Freq: Once | INTRAVENOUS | Status: AC | PRN
  Administered 2020-11-03: 10 mL via INTRAVENOUS

## 2020-11-03 MED ORDER — MIDAZOLAM HCL 5 MG/5ML IJ SOLN
1.0000 mg | Freq: Once | INTRAMUSCULAR | Status: AC | PRN
  Administered 2020-11-03: 1 mg via INTRAVENOUS
  Filled 2020-11-03: qty 5

## 2020-11-03 NOTE — Discharge Instructions (Signed)
Follow-up today with [facility].  They will be calling you with an appointment time.

## 2020-11-03 NOTE — ED Provider Notes (Addendum)
Emergency Medicine Provider Triage Evaluation Note  Juan Garza , a 60 y.o. male  was evaluated in triage.  Pt complains of right eye weakness, light sensitivity. 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  Patient notes since Friday, has had increasing weakness to the right eyelid.  Also notes light sensitivity and pain.  He denies any facial droop, slurred speech, distal paresthesias, or paralysis.  Review of Systems  Positive: Acute 3rd nerve weakness Negative: CP, facial droop, distal paralysis  Physical Exam  BP (!) 148/84 (BP Location: Left Arm)   Pulse 77   Temp 97.6 F (36.4 C) (Oral)   Resp 18   SpO2 98%  Gen:   Awake, no distress  NAD Resp:  Normal effort CTE MSK:   Moves extremities without difficulty  Other:  Decreased right eyelid movement, PERRLA, normal EOMs  Medical Decision Making  Medically screening exam initiated at 4:52 PM.  Appropriate orders placed.  Raynaldo Mullenbach was informed that the remainder of the evaluation will be completed by another provider, this initial triage assessment does not replace that evaluation, and the importance of remaining in the ED until their evaluation is complete.  Patient with ED evaluation of suspected 3rd nerve paralysis.    Melvenia Needles, PA-C 11/03/20 1714    Melvenia Needles, PA-C 11/03/20 1716    Vanessa [place], MD 11/03/20 [phone]

## 2020-11-03 NOTE — ED Triage Notes (Signed)
Pt here unable to keep right eye open, blurred vision, and severe pain behind eye. Neuro check good.

## 2020-11-03 NOTE — ED Provider Notes (Signed)
-----------------------------------------   11:06 PM on 11/03/2020 -----------------------------------------  Assuming care from Dr. Jari Pigg.  In short, Zamarian Scarano is a 60 y.o. male with a chief complaint of CN 3 palsy and eye pain.  Refer to the original H&P for additional details.  The current plan of care is to follow up MRIs and reassess.  Anticipate discharge if MRIs are normal.   ----------------------------------------- 1:24 AM on 11/04/2020 -----------------------------------------  MRIs revealed no acute abnormality and no clear indication for why the patient is having oculomotor nerve palsy.  I reassessed the patient.  He is then no obvious distress but his right eye remains closed.  He can open his right eye with some effort.  His exam remains the same as previously described, unable to look to the medial side with his right eye and moving his eye around causes pain.  As per Dr. Sheral Flow conversation with Dr. George Ina, I will discharge the patient for close outpatient follow-up with ophthalmology and his primary care provider given no indication of an emergent condition.  I am giving a prescription for Percocet for pain and he is unable to take NSAIDs.  I recommended he return to the emergency department with new or worsening symptoms and he understands and agrees with the plan.   Hinda Kehr, MD 11/04/20 [phone]

## 2020-11-03 NOTE — ED Provider Notes (Signed)
Chief Complaint   Chief Complaint  Patient presents with   Eye Problem     Subjective  Juan Garza is a 59 y.o. male who presents with right eye pain and visual changes that started on Friday. Patient reports difficulty opening the eye and reports orbital pain.  He reports that when he is able to open the eye that he will see 2 or 3 of anything that he is trying to focus the right eye on.  No trauma or falls.  No unilateral weakness, confusion. They do not wear contacts.   History obtained from patient.  Patient's past medical, social, surgical, and medication history were reviewed by me and updated in Epic.    Review of Systems   See HPI.    Objective   Vitals:   11/03/20 1145  BP: (!) 153/92  Pulse: 75  Resp: 20  Temp: 98.3 F (36.8 C)  SpO2: 98%            General:  Alert, cooperative, appears stated age. No acute distress  Eyes:  Bilateral: PERRLA.  Unable to tolerate examination for EOMI. Right: No erythema or purulence noted.  Right eye remains closed during examination. Left: WNL.  Vision: Right:20/80.  Left:20/30.  Bilateral:unable to complete.  Fluorescein:  None completed.      Assessment & Plan  1. Acute right eye pain  2. Visual changes  61 y.o. male presents with right eye pain and visual changes that started on Friday. Patient reports difficulty opening the eye and reports orbital pain.  He reports that when he is able to open the eye that he will see 2 or 3 of anything that he is trying to focus the right eye on.  No trauma or falls.  No unilateral weakness, confusion. They do not wear contacts.  Given symptoms along with assessment findings, I feel as if the patient would be best served with an immediate eye examination.  Did contact [facility] and explained the patient's condition.  They advised that they would call him to work him into the schedule today for a visit.  Advised the patient that he will receive a phone call from [facility]  with his appointment time.  Patient stable on discharge.  Return as needed.  Patient verbalized understanding and agreed with plan.  No red flag symptoms today in clinic for stroke.  Plan:   Discharge Instructions      Follow-up today with [facility].  They will be calling you with an appointment time.         Serafina Royals, [place] 11/03/20 1214

## 2020-11-03 NOTE — ED Triage Notes (Signed)
Pt presents to ED from [facility] stating he is unable to keep R eye open and reports severe pain behind R eye.

## 2020-11-03 NOTE — ED Provider Notes (Addendum)
[facility] Emergency Department Provider Note  ____________________________________________   Event Date/Time   First MD Initiated Contact with Patient 11/03/20 1713     (approximate)  I have reviewed the triage vital signs and the nursing notes.   HISTORY  Chief Complaint Eye pain    HPI Juan Garza is a 60 y.o. male with diabetes, cirrhosis who comes in with concerns for right eye issues.  Patient reports that starting on Friday he was not able to open up his right eye.  He states he is able to open it if he tries to strain but otherwise it just does not spontaneously open as well as it used to.  He reports a lot of orbital pain on the right eye worse with movement, better at rest.  He has difficulty moving the right eye medially.  That causes the most pain.  He does report a history of Bell's palsy but denies this ever happening before.  His pain is constant, severe, nothing makes it better or worse          Past Medical History:  Diagnosis Date   Chronic back pain    Diabetes mellitus without complication ([place])    Hypertension     Patient Active Problem List   Diagnosis Date Noted   Portal hypertension ([place]) 04/14/2020   Cirrhosis of liver without ascites ([place]) 02/22/2020   History of adenomatous polyp of colon 01/22/2020   Myelodysplastic syndrome ([place]) 12/27/2019   Splenomegaly 12/11/2019   Elevated bilirubin 12/11/2019   Thrombocytopenia ([place]) 11/27/2019   Leukopenia 11/27/2019   Iron deficiency anemia 11/27/2019   High risk medication use 10/09/2019   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   History of lumbar spinal fusion 01/03/2019   Left leg weakness 01/03/2019   Hair loss 04/16/2016   Severe obesity (BMI 35.0-39.9) with comorbidity ([place]) 04/16/2016   Facial weakness 11/03/2015   Low back pain radiating down leg 07/30/2015   Left lumbar radiculopathy 05/30/2015   Hyperlipidemia associated with type 2 diabetes mellitus ([place])  01/23/2014   Hyperlipidemia with target LDL less than 70 01/23/2014   Tremor 01/23/2014   Dry eye of right side 10/29/2013   Diabetic polyneuropathy associated with type 2 diabetes mellitus ([place]) 04/23/2013   Elevated transaminase level 04/23/2013   Essential hypertension 01/12/2013   Type 2 diabetes mellitus with peripheral neuropathy ([place]) 10/03/2012   GERD (gastroesophageal reflux disease) 09/04/2012   Gout 09/04/2012   Migraine without aura and without status migrainosus, not intractable 09/04/2012   Obstructive sleep apnea on CPAP 09/04/2012   Primary insomnia 09/04/2012   S/P total knee replacement, left 09/04/2012    Past Surgical History:  Procedure Laterality Date   ABDOMINAL SURGERY     APPENDECTOMY     BACK SURGERY     posteriior lumbar spine fusion   CARDIAC CATHETERIZATION     CARDIAC VALVE REPLACEMENT     colectomy partial w/anastoamosis     COLONOSCOPY WITH PROPOFOL N/A 01/22/2020   Procedure: COLONOSCOPY WITH PROPOFOL;  Surgeon: Lesly Rubenstein, MD;  Location: [facility] ENDOSCOPY;  Service: Endoscopy;  Laterality: N/A;   ESOPHAGOGASTRODUODENOSCOPY (EGD) WITH PROPOFOL N/A 01/22/2020   Procedure: ESOPHAGOGASTRODUODENOSCOPY (EGD) WITH PROPOFOL;  Surgeon: Lesly Rubenstein, MD;  Location: [facility] ENDOSCOPY;  Service: Endoscopy;  Laterality: N/A;   HERNIA REPAIR     JOINT REPLACEMENT     KNEE SURGERY Left    NASAL SINUS SURGERY      Prior to Admission medications   Medication Sig  Start Date End Date Taking? Authorizing Provider  acetaminophen (TYLENOL) 500 MG tablet Take 500 mg by mouth every 6 (six) hours as needed.    [provider]  ascorbic acid (VITAMIN C) 500 MG tablet Take by mouth. Patient not taking: No sig reported    [provider]  atorvastatin (LIPITOR) 10 MG tablet Take 10 mg by mouth daily.    [provider]  CALCIUM-MAGNESUIUM-ZINC 333-133-8.3 MG TABS Take by mouth. Patient not taking: No sig reported    [provider]  diclofenac Sodium (VOLTAREN) 1 % GEL Apply topically. 11/03/17   [provider]  dutasteride (AVODART) 0.5 MG capsule Take by mouth daily. 12/18/18   [provider]  empagliflozin (JARDIANCE) 10 MG TABS tablet Take 25 mg by mouth daily.    [provider]  eszopiclone (LUNESTA) 2 MG TABS tablet Take by mouth as needed. 04/26/19   [provider]  furosemide (LASIX) 20 MG tablet Take by mouth. 07/02/20 07/02/21  [provider]  lisinopril (ZESTRIL) 10 MG tablet Take 10 mg by mouth daily.    [provider]  methocarbamol (ROBAXIN) 750 MG tablet Take by mouth. 04/08/20   [provider]  Multiple Vitamin (MULTI-VITAMIN) tablet Take 1 tablet by mouth daily.    [provider]  omeprazole (PRILOSEC) 40 MG capsule Take 40 mg by mouth daily.    [provider]  ondansetron (ZOFRAN ODT) 4 MG disintegrating tablet Take 1 tablet (4 mg total) by mouth every 8 (eight) hours as needed for nausea or vomiting. 09/10/20   Vladimir Crofts, MD  ondansetron (ZOFRAN) 8 MG tablet Take by mouth. Patient not taking: No sig reported 04/11/19   [provider]  potassium chloride (KLOR-CON) 10 MEQ tablet Take by mouth. 07/02/20 07/02/21  [provider]  pregabalin (LYRICA) 75 MG capsule Take 75 mg by mouth. Takes 150mg  in am and 75mg  at night 01/18/19   [provider]  propranolol (INDERAL) 20 MG tablet Take 20 mg by mouth 2 (two) times daily.    [provider]  Semaglutide,0.25 or 0.5MG /DOS, (OZEMPIC, 0.25 OR 0.5 MG/DOSE,) 2 MG/1.5ML SOPN Inject into the skin. Takes every 7 days 05/22/19   [provider]  sucralfate (CARAFATE) 1 g tablet Take by mouth 2 (two) times daily. Patient not taking: Reported on 09/26/2020 01/11/20 02/18/21  [provider]  gabapentin (NEURONTIN) 300 MG capsule Take 300 mg by mouth at bedtime.  08/08/19  [provider]    Allergies Quetiapine,  Imitrex [sumatriptan], Seroquel [quetiapine fumarate], and Adhesive [tape]  Family History  Problem Relation Age of Onset   Atrial fibrillation Mother    Diabetes Father     Social History Social History   Tobacco Use   Smoking status: Former   Smokeless tobacco: Never  Scientific laboratory technician Use: Never used  Substance Use Topics   Alcohol use: Yes    Comment: none last 24hrs   Drug use: Never      Review of Systems Constitutional: No fever/chills Eyes: Positive eye pain ENT: No sore throat. Cardiovascular: Denies chest pain. Respiratory: Denies shortness of breath. Gastrointestinal: No abdominal pain.  No nausea, no vomiting.  No diarrhea.  No constipation. Genitourinary: Negative for dysuria. Musculoskeletal: Negative for back pain. Skin: Negative for rash. Neurological: Negative for headaches, focal weakness or numbness. All other ROS negative ____________________________________________   PHYSICAL EXAM:  VITAL SIGNS: ED Triage Vitals  Enc Vitals Group  BP 11/03/20 1647 (!) 148/84     Pulse Rate 11/03/20 1647 77     Resp 11/03/20 1647 18     Temp 11/03/20 1647 97.6 F (36.4 C)     Temp Source 11/03/20 1647 Oral     SpO2 11/03/20 1647 98 %     Weight 11/03/20 1704 274 lb 4.8 oz (124.4 kg)     Height --      Head Circumference --      Peak Flow --      Pain Score 11/03/20 1659 8     Pain Loc --      Pain Edu? --      Excl. in [place]? --     Constitutional: Alert and oriented. Well appearing and in no acute distress. Eyes: Retracted right eyelid but is able to open it up.  He has significant pain with trying to move the eye worse when he tries to move to the left.  Unable to cross midline.  Pupil is still reactive Head: Atraumatic. Nose: No congestion/rhinnorhea. Mouth/Throat: Mucous membranes are moist.   Neck: No stridor. Trachea Midline. FROM Cardiovascular: Normal rate, regular rhythm. Grossly normal heart sounds.  Good peripheral  circulation. Respiratory: Normal respiratory effort.  No retractions. Lungs CTAB. Gastrointestinal: Soft and nontender. No distention. No abdominal bruits.  Musculoskeletal: No lower extremity tenderness nor edema.  No joint effusions. Neurologic:  Normal speech and language. No gross focal neurologic deficits are appreciated.  Cranial nerves are intact other than the cranial nerve III palsy Skin:  Skin is warm, dry and intact. No rash noted. Psychiatric: Mood and affect are normal. Speech and behavior are normal. GU: Deferred   ____________________________________________   LABS (all labs ordered are listed, but only abnormal results are displayed)  Labs Reviewed  CBC WITH DIFFERENTIAL/PLATELET - Abnormal; Notable for the following components:      Result Value   WBC 3.4 (*)    Platelets 56 (*)    All other components within normal limits  BASIC METABOLIC PANEL   ____________________________________________   RADIOLOGY   Official radiology report(s): CT ANGIO HEAD NECK W WO CM  Result Date: 11/03/2020 CLINICAL DATA:  Unable to open right eyelid EXAM: CT ANGIOGRAPHY HEAD AND NECK TECHNIQUE: Multidetector CT imaging of the head and neck was performed using the standard protocol during bolus administration of intravenous contrast. Multiplanar CT image reconstructions and MIPs were obtained to evaluate the vascular anatomy. Carotid stenosis measurements (when applicable) are obtained utilizing NASCET criteria, using the distal internal carotid diameter as the denominator. CONTRAST:  62mL OMNIPAQUE IOHEXOL 350 MG/ML SOLN COMPARISON:  None. FINDINGS: CT HEAD FINDINGS Brain: There is no evidence of acute intracranial hemorrhage, extra-axial fluid collection, or acute infarct. The ventricles are normal in size. There is no mass lesion. There is no midline shift. Vascular: See below Skull: Normal. Negative for fracture or focal lesion. Sinuses: The paranasal sinuses are clear. Orbits: The  globes and orbits are unremarkable. Review of the MIP images confirms the above findings CTA NECK FINDINGS Aortic arch: The left vertebral artery arises directly from the aortic arch, a normal variant. The aortic arch is otherwise unremarkable. Right carotid system: No evidence of dissection, stenosis (50% or greater) or occlusion. Left carotid system: There is minimal calcified atherosclerotic plaque in the proximal left internal carotid artery. Otherwise, the left carotid system is unremarkable, with no evidence of dissection, hemodynamically significant stenosis, or occlusion. Vertebral arteries: Codominant. No evidence of dissection, stenosis (50% or greater) or  occlusion. Skeleton: There is mild degenerative change of the cervical spine at C5-C6 and C6-C7. There is no acute osseous abnormality or aggressive osseous lesion. Other neck: Multiple dental caries are noted. There are bilateral palatine tonsilliths. Upper chest: The lung apices are clear. Review of the MIP images confirms the above findings CTA HEAD FINDINGS Anterior circulation: The bilateral intracranial ICAs are patent. The bilateral ACAs and MCAs are patent. There is no aneurysm. Posterior circulation: The bilateral V4 segments are patent. The basilar artery is patent. The bilateral PCAs are patent. There is no aneurysm. Venous sinuses: None. Anatomic variants: Patent. Review of the MIP images confirms the above findings IMPRESSION: 1. No acute intracranial pathology. 2. Patent vasculature of the head and neck, with no significant stenosis, occlusion, or dissection. Electronically Signed   By: Valetta Mole M.D.   On: 11/03/2020 18:13    ____________________________________________   PROCEDURES  Procedure(s) performed (including Critical Care):  Procedures   ____________________________________________   INITIAL IMPRESSION / ASSESSMENT AND PLAN / ED COURSE  Juan Garza was evaluated in Emergency Department on 11/03/2020 for the  symptoms described in the history of present illness. He was evaluated in the context of the global COVID-19 pandemic, which necessitated consideration that the patient might be at risk for infection with the SARS-CoV-2 virus that causes COVID-19. Institutional protocols and algorithms that pertain to the evaluation of patients at risk for COVID-19 are in a state of rapid change based on information released by regulatory bodies including the CDC and federal and state organizations. These policies and algorithms were followed during the patient's care in the ED.    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  CTA was ordered without any evidence of aneurysm I am concerned still that he could have some kind of mass in his brain, stroke, cavernous venous thrombosis and given how painful it is with movement possibly a orbital infection.  We will get MRIs to further evaluate for this.  Patient was given a migraine cocktail and will need some Versed for sedation for MRI  Discussed with Dr Stephenie Acres who stated that if CTA was negative that this is most likely just from his diabetes and can be discharged to follow-up with him in a few weeks.  CTA was negative  Patient will be handed off pending MRIs and if negative will anticipate discharge home         ____________________________________________   FINAL CLINICAL IMPRESSION(S) / ED DIAGNOSES   Final diagnoses:  Right oculomotor nerve palsy      MEDICATIONS GIVEN DURING THIS VISIT:  Medications  acetaminophen (TYLENOL) tablet 650 mg (650 mg Oral Given 11/03/20 1831)  iohexol (OMNIPAQUE) 350 MG/ML injection 75 mL (75 mLs Intravenous Contrast Given 11/03/20 1743)  metoCLOPramide (REGLAN) injection 10 mg (10 mg Intravenous Given 11/03/20 2157)  diphenhydrAMINE (BENADRYL) injection 25 mg (25 mg Intravenous Given 11/03/20 2157)  midazolam (VERSED) 5 MG/5ML injection 1 mg (1 mg Intravenous Given 11/03/20  2222)     ED Discharge Orders     None        Note:  This document was prepared using Dragon voice recognition software and may include unintentional dictation errors.    Vanessa [place], MD 11/03/20 2239    Vanessa Melvin, MD 11/03/20 [phone]

## 2020-11-04 MED ORDER — OXYCODONE-ACETAMINOPHEN 5-325 MG PO TABS: 2.0000 | ORAL_TABLET | Freq: Four times a day (QID) | ORAL | 0 refills | Status: DC | PRN

## 2020-11-04 MED ORDER — DOCUSATE SODIUM 100 MG PO CAPS: ORAL_CAPSULE | ORAL | 0 refills | Status: DC

## 2020-11-04 MED ORDER — OXYCODONE-ACETAMINOPHEN 5-325 MG PO TABS
2.0000 | ORAL_TABLET | Freq: Once | ORAL | Status: AC
  Administered 2020-11-04: 2 via ORAL
  Filled 2020-11-04: qty 2

## 2020-11-04 NOTE — Discharge Instructions (Addendum)
As we discussed, your evaluation was generally reassuring today without a clear explanation for the cause of your oculomotor nerve palsy.  You had a CTA of the head and neck that did not show a specific cause for the symptoms, in addition to extensive MRI imaging that was also reassuring.  We recommend you try taking pain medication as prescribed or any other pain medication you have available to you and continue taking your regular medication.  Please call the office of Dr. George Ina to schedule a follow-up appointment and follow-up with the next available opportunity with your primary care doctor.    Return to the emergency department if you develop new or worsening symptoms that concern you.

## 2020-12-08 ENCOUNTER — Other Ambulatory Visit
Admission: RE | Admit: 2020-12-08 | Discharge: 2020-12-08 | Disposition: A | Discharge status: Home / Self Care | Payer: BC Managed Care – PPO | Attending: Ophthalmology | Admitting: Ophthalmology

## 2020-12-08 DIAGNOSIS — H4922 Sixth [abducent] nerve palsy, left eye: Secondary | ICD-10-CM | POA: Diagnosis present

## 2020-12-11 LAB — ACETYLCHOLINE RECEPTOR AB, ALL
Acety choline binding ab: <0.03 nmol/L (ref 0.00–0.24)
Acetylchol Block Ab: 12 % (ref 0–25)

## 2020-12-12 ENCOUNTER — Other Ambulatory Visit: Payer: Self-pay | Admitting: Gastroenterology

## 2020-12-12 DIAGNOSIS — K7460 Unspecified cirrhosis of liver: Secondary | ICD-10-CM

## 2020-12-31 ENCOUNTER — Other Ambulatory Visit: Payer: Self-pay

## 2020-12-31 ENCOUNTER — Inpatient Hospital Stay: Payer: BC Managed Care – PPO | Attending: Oncology

## 2020-12-31 ENCOUNTER — Ambulatory Visit: Payer: BC Managed Care – PPO | Admitting: Oncology

## 2020-12-31 ENCOUNTER — Inpatient Hospital Stay (HOSPITAL_BASED_OUTPATIENT_CLINIC_OR_DEPARTMENT_OTHER): Payer: BC Managed Care – PPO | Admitting: Oncology

## 2020-12-31 ENCOUNTER — Other Ambulatory Visit: Payer: BC Managed Care – PPO

## 2020-12-31 ENCOUNTER — Encounter: Payer: Self-pay | Admitting: Oncology

## 2020-12-31 VITALS — BP 148/95 | HR 72 | Temp 97.8°F | Resp 18 | Wt 273.3 lb

## 2020-12-31 DIAGNOSIS — Z87891 Personal history of nicotine dependence: Secondary | ICD-10-CM | POA: Diagnosis not present

## 2020-12-31 DIAGNOSIS — D509 Iron deficiency anemia, unspecified: Secondary | ICD-10-CM | POA: Diagnosis not present

## 2020-12-31 DIAGNOSIS — D72819 Decreased white blood cell count, unspecified: Secondary | ICD-10-CM | POA: Insufficient documentation

## 2020-12-31 DIAGNOSIS — D696 Thrombocytopenia, unspecified: Secondary | ICD-10-CM | POA: Insufficient documentation

## 2020-12-31 DIAGNOSIS — K7460 Unspecified cirrhosis of liver: Secondary | ICD-10-CM | POA: Insufficient documentation

## 2020-12-31 LAB — COMPREHENSIVE METABOLIC PANEL
ALT: 33 U/L (ref 0–44)
AST: 40 U/L (ref 15–41)
Albumin: 4.2 g/dL (ref 3.5–5.0)
Alkaline Phosphatase: 69 U/L (ref 38–126)
Anion gap: 7 (ref 5–15)
BUN: 18 mg/dL (ref 6–20)
CO2: 29 mmol/L (ref 22–32)
Calcium: 8.9 mg/dL (ref 8.9–10.3)
Chloride: 103 mmol/L (ref 98–111)
Creatinine, Ser: 0.84 mg/dL (ref 0.61–1.24)
GFR, Estimated: >60 mL/min (ref >60)
Glucose, Bld: 127 mg/dL — ABNORMAL HIGH (ref 70–99)
Potassium: 3.5 mmol/L (ref 3.5–5.1)
Sodium: 139 mmol/L (ref 135–145)
Total Bilirubin: 2.1 mg/dL — ABNORMAL HIGH (ref 0.3–1.2)
Total Protein: 6.9 g/dL (ref 6.5–8.1)

## 2020-12-31 LAB — CBC WITH DIFFERENTIAL/PLATELET
Abs Immature Granulocytes: 0.01 10*3/uL (ref 0.00–0.07)
Basophils Absolute: 0 10*3/uL (ref 0.0–0.1)
Basophils Relative: 1 %
Eosinophils Absolute: 0.1 10*3/uL (ref 0.0–0.5)
Eosinophils Relative: 2 %
HCT: 44.9 % (ref 39.0–52.0)
Hemoglobin: 15.2 g/dL (ref 13.0–17.0)
Immature Granulocytes: 0 %
Lymphocytes Relative: 31 %
Lymphs Abs: 1.2 10*3/uL (ref 0.7–4.0)
MCH: 30.2 pg (ref 26.0–34.0)
MCHC: 33.9 g/dL (ref 30.0–36.0)
MCV: 89.1 fL (ref 80.0–100.0)
Monocytes Absolute: 0.4 10*3/uL (ref 0.1–1.0)
Monocytes Relative: 10 %
Neutro Abs: 2.1 10*3/uL (ref 1.7–7.7)
Neutrophils Relative %: 56 %
Platelets: 67 10*3/uL — ABNORMAL LOW (ref 150–400)
RBC: 5.04 MIL/uL (ref 4.22–5.81)
RDW: 15 % (ref 11.5–15.5)
WBC: 3.7 10*3/uL — ABNORMAL LOW (ref 4.0–10.5)
nRBC: 0 % (ref 0.0–0.2)

## 2020-12-31 LAB — FERRITIN: Ferritin: 40 ng/mL (ref 24–336)

## 2020-12-31 LAB — IRON AND TIBC
Iron: 88 ug/dL (ref 45–182)
Saturation Ratios: 20 % (ref 17.9–39.5)
TIBC: 448 ug/dL (ref 250–450)
UIBC: 360 ug/dL

## 2020-12-31 NOTE — Progress Notes (Signed)
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 Mike Gip for leukopenia/thrombocytopenia.Bone marrow on 12/17/2019 revealed a hypercellular marrow with dysmegakaryocytopoiesis. The features were suggestive of a low-grade myelodysplastic syndrome. Cytogenetics were normal (46, XY).  Bone marrow review at [facility] did not reveal sufficient dysplasia to meet diagnostic criteria for a myelodysplastic syndrome.  The minimal dysplasia, leukopenia and thrombocytopenia in the marrow may be the result of reactive changes.  Early low-grade myelodysplastic syndrome could not be ruled out.  Abdomen and pelvis CT on 01/02/2020 revealed morphologic changes to the liver raising the possibility of cirrhosis. There were sequelae of portal venous hypertension including splenomegaly (15.5 cm) and upper abdominal collateral vessels. There was cholelithiasis. There was mild gallbladder wall thickening which may be secondary to portal venous hypertension/cirrhosis.  Patient follows up with [facility] hepatology for his cirrhosis.  His leukopenia and thrombocytopenia has been attributed to liver cirrhosis.  He has a history of iron deficiency anemia in the past and has received IV iron.  Interval history-overall patient is doing well today and denies any specific complaints at this time.  He has some baseline fatigue.  No recent hospitalizations.  ECOG PS- 1 Pain scale- 0   Review of systems-  Review of Systems  Constitutional:  Positive for malaise/fatigue. Negative for chills, fever and weight loss.  HENT:  Negative for congestion, ear discharge and nosebleeds.   Eyes:  Negative for blurred vision.  Respiratory:  Negative for cough, hemoptysis, sputum production, shortness of breath and wheezing.   Cardiovascular:  Negative for chest pain, palpitations, orthopnea and claudication.  Gastrointestinal:  Negative for abdominal pain, blood in stool, constipation, diarrhea, heartburn, melena, nausea and vomiting.  Genitourinary:  Negative for dysuria, flank pain, frequency, hematuria and urgency.  Musculoskeletal:  Negative for back pain, joint pain and myalgias.  Skin:  Negative for rash.  Neurological:  Negative for dizziness, tingling, focal weakness, seizures, weakness and headaches.  Endo/Heme/Allergies:  Does not bruise/bleed easily.  Psychiatric/Behavioral:  Negative for depression and suicidal ideas. The patient does not have insomnia.      Allergies  Allergen Reactions   Quetiapine Other (See Comments)    Uncontrolled leg movements Other reaction(s): Other (See Comments), Other (See Comments), Other (See Comments) Leg spasms "uncontrollable leg kicking" Uncontrolled leg movements Leg spasms "uncontrollable leg kicking"   Imitrex [Sumatriptan] Other (See Comments)    shaking   Seroquel [Quetiapine Fumarate] Other (See Comments)    Leg spasms   Adhesive [Tape] Rash     Past Medical History:  Diagnosis Date   Chronic back pain    Diabetes mellitus without complication ([place])    Hypertension      Past Surgical History:  Procedure Laterality Date   ABDOMINAL SURGERY     APPENDECTOMY     BACK SURGERY     posteriior lumbar spine fusion   CARDIAC CATHETERIZATION     CARDIAC VALVE REPLACEMENT     colectomy partial w/anastoamosis     COLONOSCOPY WITH PROPOFOL N/A 01/22/2020   Procedure: COLONOSCOPY WITH PROPOFOL;  Surgeon: Lesly Rubenstein, MD;  Location:  [facility] ENDOSCOPY;  Service: Endoscopy;  Laterality: N/A;   ESOPHAGOGASTRODUODENOSCOPY (EGD) WITH PROPOFOL N/A 01/22/2020   Procedure: ESOPHAGOGASTRODUODENOSCOPY (EGD) WITH PROPOFOL;  Surgeon: Lesly Rubenstein, MD;  Location: [facility] ENDOSCOPY;  Service: Endoscopy;  Laterality: N/A;   HERNIA REPAIR     JOINT REPLACEMENT     KNEE SURGERY Left    NASAL SINUS SURGERY      Social History   Socioeconomic History   Marital status: Married    Spouse name: Not on file   Number of children: Not on file   Years of education: Not on file   Highest education level: Not on file  Occupational History   Not on file  Tobacco Use   Smoking status: Former   Smokeless tobacco: Never  Vaping Use   Vaping Use: Never used  Substance and Sexual Activity   Alcohol use: Yes    Comment: none last 24hrs   Drug use: Never   Sexual activity: Not on file  Other Topics Concern   Not on file  Social History Narrative   Not on file   Social Determinants of Health   Financial Resource Strain: Not on file  Food Insecurity: Not on file  Transportation Needs: Not on file  Physical Activity: Not on file  Stress: Not on file  Social Connections: Not on file  Intimate Partner Violence: Not on file    Family History  Problem Relation Age of Onset   Atrial fibrillation Mother    Diabetes Father      Current Outpatient Medications:    atorvastatin (LIPITOR) 10 MG tablet, Take 10 mg by mouth daily., Disp: , Rfl:    diclofenac Sodium (VOLTAREN) 1 % GEL, Apply topically., Disp: , Rfl:    docusate sodium (COLACE) 100 MG capsule, Take 1 tablet once or twice daily as needed for constipation while taking narcotic pain medicine, Disp: 30 capsule, Rfl: 0   dutasteride (AVODART) 0.5 MG capsule, Take by mouth daily., Disp: , Rfl:    empagliflozin (JARDIANCE) 10 MG TABS tablet, Take 25 mg by mouth daily., Disp: , Rfl:    furosemide (LASIX) 20 MG tablet, Take by mouth., Disp: , Rfl:    lisinopril (ZESTRIL) 10 MG  tablet, Take 10 mg by mouth daily., Disp: , Rfl:    methocarbamol (ROBAXIN) 750 MG tablet, Take by mouth., Disp: , Rfl:    Multiple Vitamin (MULTI-VITAMIN) tablet, Take 1 tablet by mouth daily., Disp: , Rfl:    omeprazole (PRILOSEC) 40 MG capsule, Take 40 mg by mouth daily., Disp: , Rfl:    potassium chloride (KLOR-CON) 10 MEQ tablet, Take by mouth., Disp: , Rfl:    pregabalin (LYRICA) 75 MG capsule, Take 75 mg by mouth. Takes 150mg  in am and 75mg  at night, Disp: , Rfl:    propranolol (INDERAL) 20 MG tablet, Take 20 mg by mouth 2 (two) times daily., Disp: , Rfl:    Semaglutide,0.25 or 0.5MG /DOS, (OZEMPIC, 0.25 OR 0.5 MG/DOSE,) 2 MG/1.5ML SOPN, Inject into the skin. Takes every 7 days, Disp: , Rfl:    acetaminophen (TYLENOL) 500 MG tablet, Take 500 mg by mouth every 6 (six) hours as needed. (Patient not taking: Reported on 12/31/2020), Disp: , Rfl:    ascorbic acid (VITAMIN C) 500 MG tablet, Take by mouth. (Patient not taking: Reported on 06/24/2020), Disp: , Rfl:    CALCIUM-MAGNESUIUM-ZINC 333-133-8.3 MG TABS, Take by mouth. (Patient not taking: Reported on 06/24/2020), Disp: , Rfl:  eszopiclone (LUNESTA) 2 MG TABS tablet, Take by mouth as needed. (Patient not taking: Reported on 12/31/2020), Disp: , Rfl:    ondansetron (ZOFRAN ODT) 4 MG disintegrating tablet, Take 1 tablet (4 mg total) by mouth every 8 (eight) hours as needed for nausea or vomiting. (Patient not taking: Reported on 11/04/2020), Disp: 20 tablet, Rfl: 0   ondansetron (ZOFRAN) 8 MG tablet, Take by mouth. (Patient not taking: Reported on 06/24/2020), Disp: , Rfl:    oxyCODONE-acetaminophen (PERCOCET) 5-325 MG tablet, Take 2 tablets by mouth every 6 (six) hours as needed for severe pain. (Patient not taking: Reported on 12/31/2020), Disp: 20 tablet, Rfl: 0   sucralfate (CARAFATE) 1 g tablet, Take by mouth 2 (two) times daily. (Patient not taking: Reported on 09/26/2020), Disp: , Rfl:    XIFAXAN 550 MG TABS tablet, Take 550 mg by mouth 3  (three) times daily. (Patient not taking: Reported on 12/31/2020), Disp: , Rfl:   Physical exam:  Vitals:   12/31/20 0926  BP: (!) 148/95  Pulse: 72  Resp: 18  Temp: 97.8 F (36.6 C)  SpO2: 100%  Weight: 273 lb 4.8 oz (124 kg)   Physical Exam Constitutional:      General: He is not in acute distress. Cardiovascular:     Rate and Rhythm: Normal rate and regular rhythm.     Heart sounds: Normal heart sounds.  Pulmonary:     Effort: Pulmonary effort is normal.     Breath sounds: Normal breath sounds.  Abdominal:     General: Bowel sounds are normal.     Palpations: Abdomen is soft.     Comments: No palpable hepatosplenomegaly  Skin:    General: Skin is warm and dry.  Neurological:     Mental Status: He is alert and oriented to person, place, and time.     CMP Latest Ref Rng & Units 12/31/2020  Glucose 70 - 99 mg/dL 127(H)  BUN 6 - 20 mg/dL 18  Creatinine 0.61 - 1.24 mg/dL 0.84  Sodium 135 - 145 mmol/L 139  Potassium 3.5 - 5.1 mmol/L 3.5  Chloride 98 - 111 mmol/L 103  CO2 22 - 32 mmol/L 29  Calcium 8.9 - 10.3 mg/dL 8.9  Total Protein 6.5 - 8.1 g/dL 6.9  Total Bilirubin 0.3 - 1.2 mg/dL 2.1(H)  Alkaline Phos 38 - 126 U/L 69  AST 15 - 41 U/L 40  ALT 0 - 44 U/L 33   CBC Latest Ref Rng & Units 12/31/2020  WBC 4.0 - 10.5 K/uL 3.7(L)  Hemoglobin 13.0 - 17.0 g/dL 15.2  Hematocrit 39.0 - 52.0 % 44.9  Platelets 150 - 400 K/uL 67(L)     Assessment and plan- Patient is a 60 y.o. male here for routine follow-up of leukopenia/thrombocytopenia  Suspect leukopenia/thrombocytopenia secondary to underlying cirrhosis.  Second opinion bone marrow at [facility] did not favor any overt evidence of MDS.  Overall counts are stable.  Hemoglobin is normal at 15.2.  Iron deficiency anemia: Hemoglobin is normal and iron studies are pending.  I do not think the patient will require IV iron at this time.  Cirrhosis: He follows up with [facility] hepatology clinic.  He will be getting [place] surveillance  through them.  I will see him back in 6 months with CBC with differential CMP ferritin and iron studies B12 and folate   Visit Diagnosis 1. Iron deficiency anemia, unspecified iron deficiency anemia type      Dr. Randa Evens, MD, MPH [place] [facility]  [facility] [phone] 12/31/2020 10:08 AM

## 2021-01-08 ENCOUNTER — Other Ambulatory Visit: Payer: Self-pay

## 2021-01-08 ENCOUNTER — Ambulatory Visit
Admission: RE | Admit: 2021-01-08 | Discharge: 2021-01-08 | Disposition: A | Discharge status: Home / Self Care | Payer: BC Managed Care – PPO | Source: Ambulatory Visit | Attending: Gastroenterology | Admitting: Gastroenterology

## 2021-01-08 DIAGNOSIS — K7460 Unspecified cirrhosis of liver: Secondary | ICD-10-CM | POA: Diagnosis not present

## 2021-01-12 ENCOUNTER — Emergency Department: Payer: No Typology Code available for payment source

## 2021-01-12 ENCOUNTER — Emergency Department
Admission: EM | Admit: 2021-01-12 | Discharge: 2021-01-12 | Disposition: A | Discharge status: Home / Self Care | Payer: No Typology Code available for payment source | Attending: Emergency Medicine | Admitting: Emergency Medicine

## 2021-01-12 ENCOUNTER — Encounter: Payer: Self-pay | Admitting: Emergency Medicine

## 2021-01-12 ENCOUNTER — Encounter: Payer: Self-pay | Admitting: Hematology and Oncology

## 2021-01-12 DIAGNOSIS — E1169 Type 2 diabetes mellitus with other specified complication: Secondary | ICD-10-CM | POA: Insufficient documentation

## 2021-01-12 DIAGNOSIS — I10 Essential (primary) hypertension: Secondary | ICD-10-CM | POA: Insufficient documentation

## 2021-01-12 DIAGNOSIS — Z79899 Other long term (current) drug therapy: Secondary | ICD-10-CM | POA: Diagnosis not present

## 2021-01-12 DIAGNOSIS — E785 Hyperlipidemia, unspecified: Secondary | ICD-10-CM | POA: Diagnosis not present

## 2021-01-12 DIAGNOSIS — W231XXA Caught, crushed, jammed, or pinched between stationary objects, initial encounter: Secondary | ICD-10-CM | POA: Insufficient documentation

## 2021-01-12 DIAGNOSIS — Z87891 Personal history of nicotine dependence: Secondary | ICD-10-CM | POA: Insufficient documentation

## 2021-01-12 DIAGNOSIS — Z794 Long term (current) use of insulin: Secondary | ICD-10-CM | POA: Diagnosis not present

## 2021-01-12 DIAGNOSIS — E1142 Type 2 diabetes mellitus with diabetic polyneuropathy: Secondary | ICD-10-CM | POA: Diagnosis not present

## 2021-01-12 DIAGNOSIS — Z7984 Long term (current) use of oral hypoglycemic drugs: Secondary | ICD-10-CM | POA: Diagnosis not present

## 2021-01-12 DIAGNOSIS — Y990 Civilian activity done for income or pay: Secondary | ICD-10-CM | POA: Diagnosis not present

## 2021-01-12 DIAGNOSIS — E1151 Type 2 diabetes mellitus with diabetic peripheral angiopathy without gangrene: Secondary | ICD-10-CM | POA: Diagnosis not present

## 2021-01-12 DIAGNOSIS — S61217A Laceration without foreign body of left little finger without damage to nail, initial encounter: Secondary | ICD-10-CM | POA: Insufficient documentation

## 2021-01-12 MED ORDER — CEPHALEXIN 500 MG PO CAPS: 500.0000 mg | ORAL_CAPSULE | Freq: Three times a day (TID) | ORAL | 0 refills | Status: AC

## 2021-01-12 NOTE — ED Provider Notes (Signed)
[facility]-EMERGENCY DEPARTMENT  ____________________________________________  Time seen: Approximately 9:59 PM  I have reviewed the triage vital signs and the nursing notes.   HISTORY  Chief Complaint Finger Injury   Historian Patient     HPI Juan Garza is a 60 y.o. male with a history of diabetes, presents to the emergency department with a laceration of the left ring finger that was sustained after patient pinched his finger between 2 pieces of metal.  His tetanus status is out of date.  No numbness or tingling of the left hand.   Past Medical History:  Diagnosis Date   Chronic back pain    Diabetes mellitus without complication ([place])    Hypertension      Immunizations up to date:  Yes.     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   History of lumbar spinal fusion 01/03/2019   Left leg weakness 01/03/2019   Hair loss 04/16/2016   Severe obesity (BMI 35.0-39.9) with comorbidity ([place]) 04/16/2016   Facial weakness 11/03/2015   Low back pain radiating down leg 07/30/2015   Left lumbar radiculopathy 05/30/2015   Hyperlipidemia associated with type 2 diabetes mellitus ([place]) 01/23/2014   Hyperlipidemia with target LDL less than 70 01/23/2014   Tremor 01/23/2014   Dry eye of right side 10/29/2013   Diabetic polyneuropathy associated with type 2 diabetes mellitus ([place]) 04/23/2013   Elevated transaminase level  04/23/2013   Essential hypertension 01/12/2013   Type 2 diabetes mellitus with peripheral neuropathy ([place]) 10/03/2012   GERD (gastroesophageal reflux disease) 09/04/2012   Gout 09/04/2012   Migraine without aura and without status migrainosus, not intractable 09/04/2012   Obstructive sleep apnea on CPAP 09/04/2012   Primary insomnia 09/04/2012   S/P total knee replacement, left 09/04/2012    Past Surgical History:  Procedure Laterality Date   ABDOMINAL SURGERY     APPENDECTOMY     BACK SURGERY     posteriior lumbar spine fusion   CARDIAC CATHETERIZATION     CARDIAC VALVE REPLACEMENT     colectomy partial w/anastoamosis     COLONOSCOPY WITH PROPOFOL N/A 01/22/2020   Procedure: COLONOSCOPY WITH PROPOFOL;  Surgeon: Lesly Rubenstein, MD;  Location: [facility] ENDOSCOPY;  Service: Endoscopy;  Laterality: N/A;   ESOPHAGOGASTRODUODENOSCOPY (EGD) WITH PROPOFOL N/A 01/22/2020   Procedure: ESOPHAGOGASTRODUODENOSCOPY (EGD) WITH PROPOFOL;  Surgeon: Lesly Rubenstein, MD;  Location: [facility] ENDOSCOPY;  Service: Endoscopy;  Laterality: N/A;   HERNIA REPAIR     JOINT REPLACEMENT     KNEE SURGERY Left    NASAL SINUS SURGERY      Prior to Admission medications   Medication Sig Start Date End Date Taking? Authorizing Provider  cephALEXin (KEFLEX) 500 MG capsule Take 1 capsule (500 mg total) by mouth 3 (three) times daily for 7 days. 01/12/21 01/19/21 Yes Vallarie Mare M, PA-C  acetaminophen (TYLENOL) 500 MG tablet Take 500 mg by mouth every 6 (six) hours as needed. Patient  not taking: Reported on 12/31/2020    [provider]  ascorbic acid (VITAMIN C) 500 MG tablet Take by mouth. Patient not taking: Reported on 06/24/2020    [provider]  atorvastatin (LIPITOR) 10 MG tablet Take 10 mg by mouth daily.    [provider]  CALCIUM-MAGNESUIUM-ZINC 333-133-8.3 MG TABS Take by mouth. Patient not taking: Reported on 06/24/2020    [provider]  diclofenac Sodium  (VOLTAREN) 1 % GEL Apply topically. 11/03/17   [provider]  docusate sodium (COLACE) 100 MG capsule Take 1 tablet once or twice daily as needed for constipation while taking narcotic pain medicine 11/04/20   Hinda Kehr, MD  dutasteride (AVODART) 0.5 MG capsule Take by mouth daily. 12/18/18   [provider]  empagliflozin (JARDIANCE) 10 MG TABS tablet Take 25 mg by mouth daily.    [provider]  eszopiclone (LUNESTA) 2 MG TABS tablet Take by mouth as needed. Patient not taking: Reported on 12/31/2020 04/26/19   [provider]  furosemide (LASIX) 20 MG tablet Take by mouth. 07/02/20 07/02/21  [provider]  lisinopril (ZESTRIL) 10 MG tablet Take 10 mg by mouth daily.    [provider]  methocarbamol (ROBAXIN) 750 MG tablet Take by mouth. 04/08/20   [provider]  Multiple Vitamin (MULTI-VITAMIN) tablet Take 1 tablet by mouth daily.    [provider]  omeprazole (PRILOSEC) 40 MG capsule Take 40 mg by mouth daily.    [provider]  ondansetron (ZOFRAN ODT) 4 MG disintegrating tablet Take 1 tablet (4 mg total) by mouth every 8 (eight) hours as needed for nausea or vomiting. Patient not taking: Reported on 11/04/2020 09/10/20   Vladimir Crofts, MD  ondansetron (ZOFRAN) 8 MG tablet Take by mouth. Patient not taking: Reported on 06/24/2020 04/11/19   [provider]  oxyCODONE-acetaminophen (PERCOCET) 5-325 MG tablet Take 2 tablets by mouth every 6 (six) hours as needed for severe pain. Patient not taking: Reported on 12/31/2020 11/04/20   Hinda Kehr, MD  potassium chloride (KLOR-CON) 10 MEQ tablet Take by mouth. 07/02/20 07/02/21  [provider]  pregabalin (LYRICA) 75 MG capsule Take 75 mg by mouth. Takes 150mg  in am and 75mg  at night 01/18/19   [provider]  propranolol (INDERAL) 20 MG tablet Take 20 mg by mouth 2 (two) times daily.    [provider]  Semaglutide,0.25 or  0.5MG /DOS, (OZEMPIC, 0.25 OR 0.5 MG/DOSE,) 2 MG/1.5ML SOPN Inject into the skin. Takes every 7 days 05/22/19   [provider]  sucralfate (CARAFATE) 1 g tablet Take by mouth 2 (two) times daily. Patient not taking: Reported on 09/26/2020 01/11/20 02/18/21  [provider]  XIFAXAN 550 MG TABS tablet Take 550 mg by mouth 3 (three) times daily. Patient not taking: Reported on 12/31/2020 11/03/20   [provider]  gabapentin (NEURONTIN) 300 MG capsule Take 300 mg by mouth at bedtime.  08/08/19  [provider]    Allergies Quetiapine, Imitrex [sumatriptan], Seroquel [quetiapine fumarate], and Adhesive [tape]  Family History  Problem Relation Age of Onset   Atrial fibrillation Mother    Diabetes Father     Social History Social History   Tobacco Use   Smoking status: Former   Smokeless tobacco: Never  Scientific laboratory technician Use: Never used  Substance Use Topics   Alcohol use: Yes    Comment: none last 24hrs   Drug use: Never     Review of  Systems  Constitutional: No fever/chills Eyes:  No discharge ENT: No upper respiratory complaints. Respiratory: no cough. No SOB/ use of accessory muscles to breath Gastrointestinal:   No nausea, no vomiting.  No diarrhea.  No constipation. Musculoskeletal: Patient has left ring finger pain.  Skin: Negative for rash, abrasions, lacerations, ecchymosis.  ____________________________________________   PHYSICAL EXAM:  VITAL SIGNS: ED Triage Vitals  Enc Vitals Group     BP 01/12/21 2045 (!) 150/95     Pulse Rate 01/12/21 2045 73     Resp 01/12/21 2045 19     Temp 01/12/21 2045 98 F (36.7 C)     Temp Source 01/12/21 2045 Oral     SpO2 01/12/21 2045 98 %     Weight 01/12/21 2043 268 lb (121.6 kg)     Height 01/12/21 2043 6\' 3"  (1.905 m)     Head Circumference --      Peak Flow --      Pain Score 01/12/21 2043 7     Pain Loc --      Pain Edu? --      Excl. in [place]? --      Constitutional: Alert and  oriented. Well appearing and in no acute distress. Eyes: Conjunctivae are normal. PERRL. EOMI. Head: Atraumatic. ENT: Cardiovascular: Normal rate, regular rhythm. Normal S1 and S2.  Good peripheral circulation. Respiratory: Normal respiratory effort without tachypnea or retractions. Lungs CTAB. Good air entry to the bases with no decreased or absent breath sounds Gastrointestinal: Bowel sounds x 4 quadrants. Soft and nontender to palpation. No guarding or rigidity. No distention. Musculoskeletal: Full range of motion to all extremities. No obvious deformities noted Neurologic:  Normal for age. No gross focal neurologic deficits are appreciated.  Skin: Patient has a 1 cm well approximated laceration along the volar aspect of the mid distal digit and a  0.5 cm linear laceration along the dorsal aspect of the digit just inferior to fingernail. Psychiatric: Mood and affect are normal for age. Speech and behavior are normal.   ____________________________________________   LABS (all labs ordered are listed, but only abnormal results are displayed)  Labs Reviewed - No data to display ____________________________________________  EKG   ____________________________________________  RADIOLOGY Unk Pinto, personally viewed and evaluated these images (plain radiographs) as part of my medical decision making, as well as reviewing the written report by the radiologist.  DG Finger Ring Left  Result Date: 01/12/2021 CLINICAL DATA:  Trauma to the left ring finger. EXAM: LEFT RING FINGER 2+V COMPARISON:  None. FINDINGS: No acute fracture or dislocation. The bones are well mineralized. No arthritic changes. The soft tissues are unremarkable. Dressing noted over the fourth digit. No radiopaque foreign object or soft tissue gas. IMPRESSION: Negative. Electronically Signed   By: Anner Crete M.D.   On: 01/12/2021 21:22     ____________________________________________    PROCEDURES  Procedure(s) performed:     Marland KitchenMarland KitchenLaceration Repair  Date/Time: 01/12/2021 10:11 PM Performed by: Lannie Fields, PA-C Authorized by: Lannie Fields, PA-C   Consent:    Consent obtained:  Verbal   Risks discussed:  Infection and pain Universal protocol:    Procedure explained and questions answered to patient or proxy's satisfaction: yes     Patient identity confirmed:  Verbally with patient Anesthesia:    Anesthesia method:  None Laceration details:    Location:  Finger   Finger location:  L ring finger   Length (cm):  1 Exploration:    Limited  defect created (wound extended): yes     Contaminated: no   Treatment:    Area cleansed with:  Povidone-iodine   Amount of cleaning:  Standard Skin repair:    Repair method:  Tissue adhesive Approximation:    Approximation:  Close Repair type:    Repair type:  Simple Post-procedure details:    Dressing:  Splint for protection     Medications - No data to display   ____________________________________________   INITIAL IMPRESSION / ASSESSMENT AND PLAN / ED COURSE  Pertinent labs & imaging results that were available during my care of the patient were reviewed by me and considered in my medical decision making (see chart for details).      Assessment and plan Finger laceration 60 year old male presents to the emergency department with a left ring finger laceration after crush injury at work.  No bony abnormality was visualized on x-ray.  Laceration was repaired using Dermabond and patient was discharged with Keflex.  His tetanus status was updated prior to discharge.     ____________________________________________  FINAL CLINICAL IMPRESSION(S) / ED DIAGNOSES  Final diagnoses:  Laceration of left little finger without foreign body without damage to nail, initial encounter      NEW MEDICATIONS STARTED DURING THIS VISIT:  ED Discharge Orders           Ordered    cephALEXin (KEFLEX) 500 MG capsule  3 times daily        01/12/21 2157                This chart was dictated using voice recognition software/Dragon. Despite best efforts to proofread, errors can occur which can change the meaning. Any change was purely unintentional.     Lannie Fields, PA-C 01/12/21 2213    Vladimir Crofts, MD 01/19/21 [id number]

## 2021-01-12 NOTE — Discharge Instructions (Signed)
Keep finger in splint for the next two days.  Take keflex three times daily for the next seven days.

## 2021-01-12 NOTE — ED Triage Notes (Signed)
Pt presents today via POV with complaints of a left ring finger injury that occurred at work after  two pieces of metal "crushed it". The patient has a dressing on the finger and the bleeding is controlled.

## 2021-01-28 ENCOUNTER — Encounter: Payer: Self-pay | Admitting: Gastroenterology

## 2021-01-28 NOTE — H&P (Signed)
Pre-Procedure H&P   Patient ID: Juan Garza is a 60 y.o. male.  Gastroenterology Provider: Annamaria Helling, DO  Referring Provider: Laurine Blazer, PA PCP: Merryl Hacker, No  Date: 01/29/2021  HPI Juan Garza is a 60 y.o. male who presents today for Esophagogastroduodenoscopy for esophageal varices surveillance.  Patient with cirrhosis Juan myelodysplastic syndrome who underwent EGD in December 2021 demonstrating grade 2 esophageal varices for which Juan was started on propranolol- prescribed as twice a day, but only taking once a day due to his job.  Noted to have portal gastropathy at that point in Garza.  Also had H. pylori negative gastritis at that point in Garza.  Current MELD as of October is 12.  Platelet count 65,000.  A1c 7.5.  Juan noted some dark stools several days ago. Denies any nsaid use.  Had issues with bloating Juan was treated at [facility] in September of this year with Xifaxan with improvement.  Currently on omeprazole Carafate Juan Ozempic in addition to propanolol Robaxin.  No melena hematochezia hematemesis or coffee-ground emesis.  No other acute GI complaints   Past Medical History:  Diagnosis Date   Arthritis    Chronic back pain    Cirrhosis ([place])    Colon polyps    Diabetes mellitus without complication (HCC)    GERD (gastroesophageal reflux disease)    GIB (gastrointestinal bleeding)    r/t NSAID use   Gout    Headache    Hyperlipidemia    Hypertension    Right-sided Bell's palsy    Sleep apnea     Past Surgical History:  Procedure Laterality Date   ABDOMINAL SURGERY     APPENDECTOMY     BACK SURGERY     posteriior lumbar spine fusion   CARDIAC CATHETERIZATION     colectomy partial w/anastoamosis     COLON SURGERY     COLONOSCOPY WITH PROPOFOL N/A 01/22/2020   Procedure: COLONOSCOPY WITH PROPOFOL;  Surgeon: Lesly Rubenstein, MD;  Location: [facility] ENDOSCOPY;  Service: Endoscopy;  Laterality: N/A;   ESOPHAGOGASTRODUODENOSCOPY (EGD) WITH  PROPOFOL N/A 01/22/2020   Procedure: ESOPHAGOGASTRODUODENOSCOPY (EGD) WITH PROPOFOL;  Surgeon: Lesly Rubenstein, MD;  Location: [facility] ENDOSCOPY;  Service: Endoscopy;  Laterality: N/A;   HERNIA REPAIR     JOINT REPLACEMENT     KNEE SURGERY Left    NASAL SINUS SURGERY      Family History No h/o GI disease or malignancy  Review of Systems  Constitutional:  Negative for activity change, appetite change, chills, diaphoresis, fatigue, fever Juan unexpected weight change.  HENT:  Negative for trouble swallowing Juan voice change.   Respiratory:  Negative for shortness of breath Juan wheezing.   Cardiovascular:  Negative for chest pain, palpitations Juan leg swelling.  Gastrointestinal:  Negative for abdominal distention, abdominal pain, anal bleeding, blood in stool, constipation, diarrhea, nausea Juan vomiting.  Musculoskeletal:  Negative for arthralgias Juan myalgias.  Skin:  Negative for color change Juan pallor.  Neurological:  Negative for dizziness, syncope Juan weakness.  Psychiatric/Behavioral:  Negative for confusion. The patient is not nervous/anxious.   All other systems reviewed Juan are negative.   Medications No current facility-administered medications on file prior to encounter.   Current Outpatient Medications on File Prior to Encounter  Medication Sig Dispense Refill   atorvastatin (LIPITOR) 10 MG tablet Take 10 mg by mouth daily.     diclofenac Sodium (VOLTAREN) 1 % GEL Apply topically.     docusate sodium (COLACE) 100 MG capsule Take  1 tablet once or twice daily as needed for constipation while taking narcotic pain medicine 30 capsule 0   dutasteride (AVODART) 0.5 MG capsule Take by mouth daily.     empagliflozin (JARDIANCE) 10 MG TABS tablet Take 25 mg by mouth daily.     furosemide (LASIX) 20 MG tablet Take by mouth.     lisinopril (ZESTRIL) 10 MG tablet Take 10 mg by mouth daily.     methocarbamol (ROBAXIN) 750 MG tablet Take by mouth.     Multiple Vitamin (MULTI-VITAMIN)  tablet Take 1 tablet by mouth daily.     omeprazole (PRILOSEC) 40 MG capsule Take 40 mg by mouth daily.     potassium chloride (KLOR-CON) 10 MEQ tablet Take by mouth.     pregabalin (LYRICA) 75 MG capsule Take 75 mg by mouth. Takes 150mg  in am Juan 75mg  at night     propranolol (INDERAL) 20 MG tablet Take 20 mg by mouth 2 (two) times daily.     Semaglutide,0.25 or 0.5MG /DOS, (OZEMPIC, 0.25 OR 0.5 MG/DOSE,) 2 MG/1.5ML SOPN Inject into the skin. Takes every 7 days     acetaminophen (TYLENOL) 500 MG tablet Take 500 mg by mouth every 6 (six) hours as needed. (Patient not taking: Reported on 12/31/2020)     ascorbic acid (VITAMIN C) 500 MG tablet Take by mouth. (Patient not taking: Reported on 06/24/2020)     CALCIUM-MAGNESUIUM-ZINC 333-133-8.3 MG TABS Take by mouth. (Patient not taking: Reported on 06/24/2020)     eszopiclone (LUNESTA) 2 MG TABS tablet Take by mouth as needed. (Patient not taking: Reported on 12/31/2020)     ondansetron (ZOFRAN ODT) 4 MG disintegrating tablet Take 1 tablet (4 mg total) by mouth every 8 (eight) hours as needed for nausea or vomiting. (Patient not taking: Reported on 11/04/2020) 20 tablet 0   ondansetron (ZOFRAN) 8 MG tablet Take by mouth. (Patient not taking: Reported on 06/24/2020)     oxyCODONE-acetaminophen (PERCOCET) 5-325 MG tablet Take 2 tablets by mouth every 6 (six) hours as needed for severe pain. (Patient not taking: Reported on 12/31/2020) 20 tablet 0   sucralfate (CARAFATE) 1 g tablet Take by mouth 2 (two) times daily. (Patient not taking: Reported on 09/26/2020)     XIFAXAN 550 MG TABS tablet Take 550 mg by mouth 3 (three) times daily. (Patient not taking: Reported on 12/31/2020)     [DISCONTINUED] gabapentin (NEURONTIN) 300 MG capsule Take 300 mg by mouth at bedtime.      Pertinent medications related to GI Juan procedure were reviewed by me with the patient prior to the procedure   Current Facility-Administered Medications:    0.9 %  sodium chloride  infusion, , Intravenous, Continuous, Annamaria Helling, DO, Last Rate: 20 mL/hr at 01/29/21 1027, 1,000 mL at 01/29/21 1027      Allergies  Allergen Reactions   Quetiapine Other (See Comments)    Uncontrolled leg movements Other reaction(s): Other (See Comments), Other (See Comments), Other (See Comments) Leg spasms "uncontrollable leg kicking" Uncontrolled leg movements Leg spasms "uncontrollable leg kicking"   Nsaids Other (See Comments)   Adhesive [Tape] Itching Juan Rash   Imitrex [Sumatriptan] Other (See Comments)    shaking   Seroquel [Quetiapine Fumarate] Other (See Comments)    Leg spasms   Allergies were reviewed by me prior to the procedure  Objective    Vitals:   01/29/21 1006  BP: (!) 149/84  Pulse: 72  Resp: 18  Temp: (!) 96.8 F (36 C)  TempSrc:  Temporal  SpO2: 100%  Weight: 106.8 kg  Height: 6\' 2"  (1.88 m)     Physical Exam Vitals Juan nursing note reviewed.  Constitutional:      General: Juan is not in acute distress.    Appearance: Normal appearance. Juan is not ill-appearing, toxic-appearing or diaphoretic.  HENT:     Head: Normocephalic Juan atraumatic.     Nose: Nose normal.     Mouth/Throat:     Mouth: Mucous membranes are moist.     Pharynx: Oropharynx is clear.  Eyes:     General: No scleral icterus.    Extraocular Movements: Extraocular movements intact.  Cardiovascular:     Rate Juan Rhythm: Normal rate Juan regular rhythm.     Heart sounds: Normal heart sounds. No murmur heard.   No friction rub. No gallop.  Pulmonary:     Effort: Pulmonary effort is normal. No respiratory distress.     Breath sounds: Normal breath sounds. No wheezing, rhonchi or rales.  Abdominal:     General: Bowel sounds are normal. There is no distension.     Palpations: Abdomen is soft.     Tenderness: There is no abdominal tenderness. There is no guarding or rebound.  Musculoskeletal:     Cervical back: Neck supple.     Right lower leg: No edema.     Left  lower leg: No edema.  Skin:    General: Skin is warm Juan dry.     Coloration: Skin is not jaundiced or pale.  Neurological:     General: No focal deficit present.     Mental Status: Juan Garza, Juan Garza, Juan Garza. Mental status is at baseline.  Psychiatric:        Mood Juan Affect: Mood normal.        Behavior: Behavior normal.        Thought Content: Thought content normal.        Judgment: Judgment normal.     Assessment:  Juan Garza is a 60 y.o. male  who presents today for Esophagogastroduodenoscopy for esophageal varices surveillance.  Plan:  Esophagogastroduodenoscopy with possible intervention today  Esophagogastroduodenoscopy with possible biopsy, control of bleeding, polypectomy, Juan interventions as necessary has been discussed with the patient/patient representative. Informed consent was obtained from the patient/patient representative after explaining the indication, nature, Juan risks of the procedure including but not limited to death, bleeding, perforation, missed neoplasm/lesions, cardiorespiratory compromise, Juan reaction to medications. Opportunity for questions was given Juan appropriate answers were provided. Patient/patient representative has verbalized understanding is amenable to undergoing the procedure.   Annamaria Helling, DO  [facility] Gastroenterology  Portions of the record may have been created with voice recognition software. Occasional wrong-word or 'sound-a-like' substitutions may have occurred due to the inherent limitations of voice recognition software.  Read the chart carefully Juan recognize, using context, where substitutions may have occurred.

## 2021-01-29 ENCOUNTER — Ambulatory Visit: Payer: BC Managed Care – PPO | Admitting: Anesthesiology

## 2021-01-29 ENCOUNTER — Ambulatory Visit
Admission: RE | Admit: 2021-01-29 | Discharge: 2021-01-29 | Disposition: A | Discharge status: Home / Self Care | Payer: BC Managed Care – PPO | Source: Ambulatory Visit | Attending: Gastroenterology | Admitting: Gastroenterology

## 2021-01-29 ENCOUNTER — Encounter
Admission: RE | Disposition: A | Discharge status: Home / Self Care | Payer: Self-pay | Source: Ambulatory Visit | Attending: Gastroenterology

## 2021-01-29 ENCOUNTER — Encounter: Payer: Self-pay | Admitting: Gastroenterology

## 2021-01-29 ENCOUNTER — Encounter: Payer: Self-pay | Admitting: Hematology and Oncology

## 2021-01-29 DIAGNOSIS — E109 Type 1 diabetes mellitus without complications: Secondary | ICD-10-CM | POA: Diagnosis not present

## 2021-01-29 DIAGNOSIS — E669 Obesity, unspecified: Secondary | ICD-10-CM | POA: Diagnosis not present

## 2021-01-29 DIAGNOSIS — K31819 Angiodysplasia of stomach and duodenum without bleeding: Secondary | ICD-10-CM | POA: Insufficient documentation

## 2021-01-29 DIAGNOSIS — M1990 Unspecified osteoarthritis, unspecified site: Secondary | ICD-10-CM | POA: Insufficient documentation

## 2021-01-29 DIAGNOSIS — K7460 Unspecified cirrhosis of liver: Secondary | ICD-10-CM | POA: Insufficient documentation

## 2021-01-29 DIAGNOSIS — K2950 Unspecified chronic gastritis without bleeding: Secondary | ICD-10-CM | POA: Diagnosis not present

## 2021-01-29 DIAGNOSIS — K3189 Other diseases of stomach and duodenum: Secondary | ICD-10-CM | POA: Insufficient documentation

## 2021-01-29 DIAGNOSIS — Z79899 Other long term (current) drug therapy: Secondary | ICD-10-CM | POA: Diagnosis not present

## 2021-01-29 DIAGNOSIS — R519 Headache, unspecified: Secondary | ICD-10-CM | POA: Diagnosis not present

## 2021-01-29 DIAGNOSIS — K219 Gastro-esophageal reflux disease without esophagitis: Secondary | ICD-10-CM | POA: Diagnosis not present

## 2021-01-29 DIAGNOSIS — I8510 Secondary esophageal varices without bleeding: Secondary | ICD-10-CM | POA: Insufficient documentation

## 2021-01-29 DIAGNOSIS — D469 Myelodysplastic syndrome, unspecified: Secondary | ICD-10-CM | POA: Diagnosis not present

## 2021-01-29 DIAGNOSIS — Z09 Encounter for follow-up examination after completed treatment for conditions other than malignant neoplasm: Secondary | ICD-10-CM | POA: Insufficient documentation

## 2021-01-29 DIAGNOSIS — G4730 Sleep apnea, unspecified: Secondary | ICD-10-CM | POA: Diagnosis not present

## 2021-01-29 DIAGNOSIS — Z6830 Body mass index (BMI) 30.0-30.9, adult: Secondary | ICD-10-CM | POA: Diagnosis not present

## 2021-01-29 DIAGNOSIS — I10 Essential (primary) hypertension: Secondary | ICD-10-CM | POA: Diagnosis not present

## 2021-01-29 DIAGNOSIS — D759 Disease of blood and blood-forming organs, unspecified: Secondary | ICD-10-CM | POA: Diagnosis not present

## 2021-01-29 HISTORY — DX: Gastro-esophageal reflux disease without esophagitis: K21.9

## 2021-01-29 HISTORY — DX: Hyperlipidemia, unspecified: E78.5

## 2021-01-29 HISTORY — PX: ESOPHAGOGASTRODUODENOSCOPY: SHX5428

## 2021-01-29 HISTORY — DX: Gout, unspecified: M10.9

## 2021-01-29 HISTORY — DX: Sleep apnea, unspecified: G47.30

## 2021-01-29 HISTORY — DX: Gastrointestinal hemorrhage, unspecified: K92.2

## 2021-01-29 HISTORY — DX: Headache, unspecified: R51.9

## 2021-01-29 HISTORY — DX: Bell's palsy: G51.0

## 2021-01-29 HISTORY — DX: Unspecified osteoarthritis, unspecified site: M19.90

## 2021-01-29 HISTORY — DX: Polyp of colon: K63.5

## 2021-01-29 HISTORY — DX: Unspecified cirrhosis of liver: K74.60

## 2021-01-29 LAB — GLUCOSE, CAPILLARY: Glucose-Capillary: 100 mg/dL — ABNORMAL HIGH (ref 70–99)

## 2021-01-29 SURGERY — EGD (ESOPHAGOGASTRODUODENOSCOPY)
Anesthesia: General

## 2021-01-29 MED ORDER — PROPOFOL 500 MG/50ML IV EMUL
INTRAVENOUS | Status: DC | PRN
  Administered 2021-01-29: 200 ug/kg/min via INTRAVENOUS

## 2021-01-29 MED ORDER — SODIUM CHLORIDE 0.9 % IV SOLN
INTRAVENOUS | Status: DC
  Administered 2021-01-29: 10:00:00 1000 mL via INTRAVENOUS

## 2021-01-29 MED ORDER — PROPOFOL 10 MG/ML IV BOLUS
INTRAVENOUS | Status: DC | PRN
  Administered 2021-01-29: 100 mg via INTRAVENOUS

## 2021-01-29 NOTE — Interval H&P Note (Signed)
History and Physical Interval Note: Preprocedure H&P from 01/29/21  was reviewed and there was no interval change after seeing and examining the patient.  Written consent was obtained from the patient after discussion of risks, benefits, and alternatives. Patient has consented to proceed with Esophagogastroduodenoscopy with possible intervention   01/29/2021 11:06 AM  Juan Garza  has presented today for surgery, with the diagnosis of CIRRHOSIS.  The various methods of treatment have been discussed with the patient and family. After consideration of risks, benefits and other options for treatment, the patient has consented to  Procedure(s) with comments: ESOPHAGOGASTRODUODENOSCOPY (EGD) (N/A) - DM as a surgical intervention.  The patient's history has been reviewed, patient examined, no change in status, stable for surgery.  I have reviewed the patient's chart and labs.  Questions were answered to the patient's satisfaction.     Annamaria Helling

## 2021-01-29 NOTE — Anesthesia Preprocedure Evaluation (Signed)
Anesthesia Evaluation  Patient identified by MRN, date of birth, ID band Patient awake    Reviewed: Allergy & Precautions, NPO status , Patient's Chart, lab work & pertinent test results  Airway Mallampati: III  TM Distance: >3 FB Neck ROM: full  Mouth opening: Limited Mouth Opening  Dental  (+) Teeth Intact   Pulmonary neg pulmonary ROS, sleep apnea , former smoker,    Pulmonary exam normal  + decreased breath sounds      Cardiovascular Exercise Tolerance: Good hypertension, Pt. on medications negative cardio ROS Normal cardiovascular exam Rhythm:Regular     Neuro/Psych  Headaches, negative neurological ROS  negative psych ROS   GI/Hepatic negative GI ROS, Neg liver ROS, GERD  ,(+) Cirrhosis   Esophageal Varices    ,   Endo/Other  negative endocrine ROSdiabetes, Well Controlled, Type 1  Renal/GU negative Renal ROS  negative genitourinary   Musculoskeletal  (+) Arthritis ,   Abdominal (+) + obese,   Peds negative pediatric ROS (+)  Hematology negative hematology ROS (+) Blood dyscrasia, anemia ,   Anesthesia Other Findings Past Medical History: No date: Arthritis No date: Chronic back pain No date: Cirrhosis (HCC) No date: Colon polyps No date: Diabetes mellitus without complication (HCC) No date: GERD (gastroesophageal reflux disease) No date: GIB (gastrointestinal bleeding)     Comment:  r/t NSAID use No date: Gout No date: Headache No date: Hyperlipidemia No date: Hypertension No date: Right-sided Bell's palsy No date: Sleep apnea  Past Surgical History: No date: ABDOMINAL SURGERY No date: APPENDECTOMY No date: BACK SURGERY     Comment:  posteriior lumbar spine fusion No date: CARDIAC CATHETERIZATION No date: colectomy partial w/anastoamosis No date: COLON SURGERY 01/22/2020: COLONOSCOPY WITH PROPOFOL; N/A     Comment:  Procedure: COLONOSCOPY WITH PROPOFOL;  Surgeon:               Lesly Rubenstein, MD;  Location: [facility] ENDOSCOPY;                Service: Endoscopy;  Laterality: N/A; 01/22/2020: ESOPHAGOGASTRODUODENOSCOPY (EGD) WITH PROPOFOL; N/A     Comment:  Procedure: ESOPHAGOGASTRODUODENOSCOPY (EGD) WITH               PROPOFOL;  Surgeon: Lesly Rubenstein, MD;  Location:               [facility] ENDOSCOPY;  Service: Endoscopy;  Laterality: N/A; No date: HERNIA REPAIR No date: JOINT REPLACEMENT No date: KNEE SURGERY; Left No date: NASAL SINUS SURGERY  BMI    Body Mass Index: 30.23 kg/m      Reproductive/Obstetrics negative OB ROS                             Anesthesia Physical Anesthesia Plan  ASA: 4  Anesthesia Plan: General   Post-op Pain Management:    Induction: Intravenous  PONV Risk Score and Plan: Propofol infusion and TIVA  Airway Management Planned: Natural Airway and Nasal Cannula  Additional Equipment:   Intra-op Plan:   Post-operative Plan:   Informed Consent: I have reviewed the patients History and Physical, chart, labs and discussed the procedure including the risks, benefits and alternatives for the proposed anesthesia with the patient or authorized representative who has indicated his/her understanding and acceptance.     Dental Advisory Given  Plan Discussed with: CRNA and Surgeon  Anesthesia Plan Comments:         Anesthesia Quick Evaluation

## 2021-01-29 NOTE — Transfer of Care (Signed)
Immediate Anesthesia Transfer of Care Note  Patient: Juan Garza  Procedure(s) Performed: ESOPHAGOGASTRODUODENOSCOPY (EGD)  Patient Location: PACU  Anesthesia Type:General  Level of Consciousness: sedated  Airway & Oxygen Therapy: Patient Spontanous Breathing  Post-op Assessment: Report given to RN and Post -op Vital signs reviewed and stable  Post vital signs: Reviewed and stable  Last Vitals:  Vitals Value Taken Time  BP    Temp    Pulse    Resp    SpO2      Last Pain:  Vitals:   01/29/21 1006  TempSrc: Temporal  PainSc: 0-No pain         Complications: No notable events documented.

## 2021-01-29 NOTE — Anesthesia Postprocedure Evaluation (Signed)
Anesthesia Post Note  Patient: Juan Garza  Procedure(s) Performed: ESOPHAGOGASTRODUODENOSCOPY (EGD)  Patient location during evaluation: PACU Anesthesia Type: General Level of consciousness: awake and awake and alert Pain management: pain level controlled Vital Signs Assessment: post-procedure vital signs reviewed and stable Respiratory status: spontaneous breathing Cardiovascular status: blood pressure returned to baseline Anesthetic complications: no   No notable events documented.   Last Vitals:  Vitals:   01/29/21 1006 01/29/21 1123  BP: (!) 149/84 123/68  Pulse: 72   Resp: 18   Temp: (!) 36 C   SpO2: 100%     Last Pain:  Vitals:   01/29/21 1123  TempSrc:   PainSc: 0-No pain                 VAN STAVEREN,Nichlos Kunzler

## 2021-01-29 NOTE — Op Note (Signed)
[facility] Gastroenterology Patient Name: Juan Garza Procedure Date: 01/29/2021 11:07 AM MRN: [id number] Account #: [id number] Date of Birth: 11/13/1960 Admit Type: Outpatient Age: 60 Room: [facility] ENDO ROOM 1 Gender: Male Note Status: Finalized Instrument Name: Upper Endoscope [id number] Procedure:             Upper GI endoscopy Indications:           Follow-up of esophageal varices Providers:             Annamaria Helling DO, DO Referring MD:          No Local Md, MD (Referring MD) Medicines:             Monitored Anesthesia Care Complications:         No immediate complications. Estimated blood loss:                         Minimal. Procedure:             Pre-Anesthesia Assessment:                        - Prior to the procedure, a History and Physical was                         performed, and patient medications and allergies were                         reviewed. The patient is competent. The risks and                         benefits of the procedure and the sedation options and                         risks were discussed with the patient. All questions                         were answered and informed consent was obtained.                         Patient identification and proposed procedure were                         verified by the physician, the nurse, the anesthetist                         and the technician in the endoscopy suite. Mental                         Status Examination: alert and oriented. Airway                         Examination: normal oropharyngeal airway and neck                         mobility. Respiratory Examination: clear to                         auscultation. CV Examination: RRR, no murmurs, no S3  or S4. Prophylactic Antibiotics: The patient does not                         require prophylactic antibiotics. Prior                         Anticoagulants: The patient has taken no previous                          anticoagulant or antiplatelet agents. ASA Grade                         Assessment: IV - A patient with severe systemic                         disease that is a constant threat to life. After                         reviewing the risks and benefits, the patient was                         deemed in satisfactory condition to undergo the                         procedure. The anesthesia plan was to use monitored                         anesthesia care (MAC). Immediately prior to                         administration of medications, the patient was                         re-assessed for adequacy to receive sedatives. The                         heart rate, respiratory rate, oxygen saturations,                         blood pressure, adequacy of pulmonary ventilation, and                         response to care were monitored throughout the                         procedure. The physical status of the patient was                         re-assessed after the procedure.                        After obtaining informed consent, the endoscope was                         passed under direct vision. Throughout the procedure,                         the patient's blood pressure, pulse, and oxygen  saturations were monitored continuously. The Endoscope                         was introduced through the mouth, and advanced to the                         second part of duodenum. The upper GI endoscopy was                         accomplished without difficulty. The patient tolerated                         the procedure well. Findings:      The duodenal bulb, first portion of the duodenum and second portion of       the duodenum were normal. Estimated blood loss: none.      Mild gastric antral vascular ectasia without bleeding was present in the       gastric antrum. Estimated blood loss: none.      Localized moderate inflammation characterized by erosions was  found in       the gastric antrum. Biopsies were taken with a cold forceps for       Helicobacter pylori testing. Estimated blood loss was minimal.      Mild portal hypertensive gastropathy was found in the entire examined       stomach. Estimated blood loss: none.      The exam of the stomach was otherwise normal.      No gastric varices      The Z-line was regular. Estimated blood loss: none.      Esophagogastric landmarks were identified: the gastroesophageal junction       was found at 38 cm from the incisors.      Two columns of grade II varices with no bleeding and no stigmata of       recent bleeding were found in the distal esophagus,. No red wale signs       were present. The varices appeared unchanged in size from prior exam.       Estimated blood loss: none.      The exam of the esophagus was otherwise normal. Impression:            - Normal duodenal bulb, first portion of the duodenum                         and second portion of the duodenum.                        - Gastric antral vascular ectasia without bleeding.                        - Gastritis. Biopsied.                        - Portal hypertensive gastropathy.                        - Z-line regular.                        - Esophagogastric landmarks identified.                        -  Grade II esophageal varices with no bleeding and no                         stigmata of recent bleeding. Recommendation:        - Discharge patient to home.                        - Resume previous diet.                        - Continue present medications.                        - Await pathology results.                        - Repeat upper endoscopy in 1 year for surveillance.                        - Return to GI clinic as previously scheduled. Procedure Code(s):     --- Professional ---                        [phone], Esophagogastroduodenoscopy, flexible,                         transoral; with biopsy, single or  multiple Diagnosis Code(s):     --- Professional ---                        K31.819, Angiodysplasia of stomach and duodenum                         without bleeding                        K29.70, Gastritis, unspecified, without bleeding                        K76.6, Portal hypertension                        K31.89, Other diseases of stomach and duodenum                        I85.00, Esophageal varices without bleeding CPT copyright 2019 [employer]. All rights reserved. The codes documented in this report are preliminary and upon coder review may  be revised to meet current compliance requirements. Attending Participation:      I personally performed the entire procedure. Volney American, DO Annamaria Helling DO, DO 01/29/2021 11:24:42 AM This report has been signed electronically. Number of Addenda: 0 Note Initiated On: 01/29/2021 11:07 AM Estimated Blood Loss:  Estimated blood loss was minimal.      [facility]

## 2021-01-30 ENCOUNTER — Encounter: Payer: Self-pay | Admitting: Gastroenterology

## 2021-02-03 LAB — SURGICAL PATHOLOGY

## 2021-04-27 ENCOUNTER — Ambulatory Visit: Admit: 2021-04-27 | Discharge: 2021-04-27 | Payer: PRIVATE HEALTH INSURANCE

## 2021-04-27 DIAGNOSIS — I8510 Secondary esophageal varices without bleeding: Principal | ICD-10-CM

## 2021-04-27 DIAGNOSIS — D696 Thrombocytopenia, unspecified: Principal | ICD-10-CM

## 2021-04-27 DIAGNOSIS — K766 Portal hypertension: Principal | ICD-10-CM

## 2021-04-27 DIAGNOSIS — K7460 Unspecified cirrhosis of liver: Principal | ICD-10-CM

## 2021-04-27 DIAGNOSIS — K7581 Nonalcoholic steatohepatitis (NASH): Principal | ICD-10-CM

## 2021-06-29 ENCOUNTER — Other Ambulatory Visit: Payer: BC Managed Care – PPO

## 2021-06-30 ENCOUNTER — Inpatient Hospital Stay: Payer: BC Managed Care – PPO | Attending: Oncology

## 2021-06-30 ENCOUNTER — Inpatient Hospital Stay (HOSPITAL_BASED_OUTPATIENT_CLINIC_OR_DEPARTMENT_OTHER): Payer: BC Managed Care – PPO | Admitting: Oncology

## 2021-06-30 ENCOUNTER — Encounter: Payer: Self-pay | Admitting: Oncology

## 2021-06-30 VITALS — BP 141/79 | HR 71 | Temp 98.1°F | Resp 20 | Wt 271.7 lb

## 2021-06-30 DIAGNOSIS — D61818 Other pancytopenia: Secondary | ICD-10-CM | POA: Insufficient documentation

## 2021-06-30 DIAGNOSIS — K7460 Unspecified cirrhosis of liver: Secondary | ICD-10-CM | POA: Insufficient documentation

## 2021-06-30 DIAGNOSIS — D696 Thrombocytopenia, unspecified: Secondary | ICD-10-CM | POA: Insufficient documentation

## 2021-06-30 DIAGNOSIS — Z79899 Other long term (current) drug therapy: Secondary | ICD-10-CM | POA: Diagnosis not present

## 2021-06-30 DIAGNOSIS — D509 Iron deficiency anemia, unspecified: Secondary | ICD-10-CM

## 2021-06-30 LAB — CBC WITH DIFFERENTIAL/PLATELET
Abs Immature Granulocytes: 0.01 10*3/uL (ref 0.00–0.07)
Basophils Absolute: 0 10*3/uL (ref 0.0–0.1)
Basophils Relative: 1 %
Eosinophils Absolute: 0.1 10*3/uL (ref 0.0–0.5)
Eosinophils Relative: 3 %
HCT: 45.5 % (ref 39.0–52.0)
Hemoglobin: 15.1 g/dL (ref 13.0–17.0)
Immature Granulocytes: 0 %
Lymphocytes Relative: 32 %
Lymphs Abs: 1.3 10*3/uL (ref 0.7–4.0)
MCH: 29.4 pg (ref 26.0–34.0)
MCHC: 33.2 g/dL (ref 30.0–36.0)
MCV: 88.7 fL (ref 80.0–100.0)
Monocytes Absolute: 0.4 10*3/uL (ref 0.1–1.0)
Monocytes Relative: 9 %
Neutro Abs: 2.2 10*3/uL (ref 1.7–7.7)
Neutrophils Relative %: 55 %
Platelets: 69 10*3/uL — ABNORMAL LOW (ref 150–400)
RBC: 5.13 MIL/uL (ref 4.22–5.81)
RDW: 14.3 % (ref 11.5–15.5)
WBC: 3.9 10*3/uL — ABNORMAL LOW (ref 4.0–10.5)
nRBC: 0 % (ref 0.0–0.2)

## 2021-06-30 LAB — IRON AND TIBC
Iron: 53 ug/dL (ref 45–182)
Saturation Ratios: 11 % — ABNORMAL LOW (ref 17.9–39.5)
TIBC: 482 ug/dL — ABNORMAL HIGH (ref 250–450)
UIBC: 429 ug/dL

## 2021-06-30 LAB — COMPREHENSIVE METABOLIC PANEL
ALT: 35 U/L (ref 0–44)
AST: 48 U/L — ABNORMAL HIGH (ref 15–41)
Albumin: 3.9 g/dL (ref 3.5–5.0)
Alkaline Phosphatase: 63 U/L (ref 38–126)
Anion gap: 7 (ref 5–15)
BUN: 17 mg/dL (ref 6–20)
CO2: 28 mmol/L (ref 22–32)
Calcium: 8.6 mg/dL — ABNORMAL LOW (ref 8.9–10.3)
Chloride: 102 mmol/L (ref 98–111)
Creatinine, Ser: 1 mg/dL (ref 0.61–1.24)
GFR, Estimated: >60 mL/min (ref >60)
Glucose, Bld: 188 mg/dL — ABNORMAL HIGH (ref 70–99)
Potassium: 3.7 mmol/L (ref 3.5–5.1)
Sodium: 137 mmol/L (ref 135–145)
Total Bilirubin: 2 mg/dL — ABNORMAL HIGH (ref 0.3–1.2)
Total Protein: 6.8 g/dL (ref 6.5–8.1)

## 2021-06-30 LAB — VITAMIN B12: Vitamin B-12: 1025 pg/mL — ABNORMAL HIGH (ref 180–914)

## 2021-06-30 LAB — FERRITIN: Ferritin: 26 ng/mL (ref 24–336)

## 2021-06-30 LAB — FOLATE: Folate: 19.1 ng/mL (ref >5.9)

## 2021-06-30 NOTE — Progress Notes (Signed)
pt states at times he has upper left chest pain but does not last long.

## 2021-06-30 NOTE — Progress Notes (Signed)
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 Mike Gip for leukopenia/thrombocytopenia.Bone marrow on 12/17/2019 revealed a hypercellular marrow with dysmegakaryocytopoiesis. The features were suggestive of a low-grade myelodysplastic syndrome. Cytogenetics were normal (46, XY).  Bone marrow review at [facility] did not reveal sufficient dysplasia to meet diagnostic criteria for a myelodysplastic syndrome.  The minimal dysplasia, leukopenia and thrombocytopenia in the marrow may be the result of reactive changes.  Early low-grade myelodysplastic syndrome could not be ruled out.  Abdomen and pelvis CT on 01/02/2020 revealed morphologic changes to the liver raising the possibility of cirrhosis. There were sequelae of portal venous hypertension including splenomegaly (15.5 cm) and upper abdominal collateral vessels. There was cholelithiasis. There was mild gallbladder wall thickening which may be secondary to portal venous hypertension/cirrhosis.  Patient follows up with [facility] hepatology for his cirrhosis.   His leukopenia and thrombocytopenia has been attributed to liver cirrhosis.  He has a history of iron deficiency anemia in the past and has received IV iron.  Interval history-patient reports feeling at his baseline state of health.He follows up with GI for his cirrhosis.  No recent hospitalizations.  ECOG PS- 1 Pain scale- 0   Review of systems- Review of Systems  Constitutional:  Positive for  malaise/fatigue. Negative for chills, fever and weight loss.  HENT:  Negative for congestion, ear discharge and nosebleeds.   Eyes:  Negative for blurred vision.  Respiratory:  Negative for cough, hemoptysis, sputum production, shortness of breath and wheezing.   Cardiovascular:  Negative for chest pain, palpitations, orthopnea and claudication.  Gastrointestinal:  Negative for abdominal pain, blood in stool, constipation, diarrhea, heartburn, melena, nausea and vomiting.  Genitourinary:  Negative for dysuria, flank pain, frequency, hematuria and urgency.  Musculoskeletal:  Negative for back pain, joint pain and myalgias.  Skin:  Negative for rash.  Neurological:  Negative for dizziness, tingling, focal weakness, seizures, weakness and headaches.  Endo/Heme/Allergies:  Does not bruise/bleed easily.  Psychiatric/Behavioral:  Negative for depression and suicidal ideas. The patient does not have insomnia.       Allergies  Allergen Reactions   Quetiapine Other (See Comments)    Uncontrolled leg movements Other reaction(s): Other (See Comments), Other (See Comments), Other (See Comments) Leg spasms "uncontrollable leg kicking" Uncontrolled leg movements Leg spasms "uncontrollable leg kicking"   Nsaids Other (See Comments)   Adhesive [Tape] Itching and Rash   Imitrex [Sumatriptan] Other (See Comments)    shaking   Seroquel [Quetiapine Fumarate] Other (See Comments)    Leg spasms     Past Medical History:  Diagnosis Date   Arthritis    Chronic back pain    Cirrhosis ([place])    Colon polyps    Diabetes mellitus without complication ([place])    GERD (gastroesophageal reflux disease)    GIB (gastrointestinal bleeding)    r/t NSAID use   Gout    Headache    Hyperlipidemia    Hypertension    Right-sided Bell's palsy    Sleep apnea      Past Surgical History:  Procedure Laterality  Date   ABDOMINAL SURGERY     APPENDECTOMY     BACK SURGERY     posteriior lumbar spine fusion    CARDIAC CATHETERIZATION     colectomy partial w/anastoamosis     COLON SURGERY     COLONOSCOPY WITH PROPOFOL N/A 01/22/2020   Procedure: COLONOSCOPY WITH PROPOFOL;  Surgeon: Lesly Rubenstein, MD;  Location: [facility] ENDOSCOPY;  Service: Endoscopy;  Laterality: N/A;   ESOPHAGOGASTRODUODENOSCOPY N/A 01/29/2021   Procedure: ESOPHAGOGASTRODUODENOSCOPY (EGD);  Surgeon: Annamaria Helling, DO;  Location: [facility] ENDOSCOPY;  Service: Gastroenterology;  Laterality: N/A;  DM   ESOPHAGOGASTRODUODENOSCOPY (EGD) WITH PROPOFOL N/A 01/22/2020   Procedure: ESOPHAGOGASTRODUODENOSCOPY (EGD) WITH PROPOFOL;  Surgeon: Lesly Rubenstein, MD;  Location: [facility] ENDOSCOPY;  Service: Endoscopy;  Laterality: N/A;   HERNIA REPAIR     JOINT REPLACEMENT     KNEE SURGERY Left    NASAL SINUS SURGERY      Social History   Socioeconomic History   Marital status: Married    Spouse name: Not on file   Number of children: Not on file   Years of education: Not on file   Highest education level: Not on file  Occupational History   Not on file  Tobacco Use   Smoking status: Former   Smokeless tobacco: Never  Vaping Use   Vaping Use: Never used  Substance and Sexual Activity   Alcohol use: Never    Comment: none last 24hrs   Drug use: Never   Sexual activity: Not on file  Other Topics Concern   Not on file  Social History Narrative   Not on file   Social Determinants of Health   Financial Resource Strain: Not on file  Food Insecurity: Not on file  Transportation Needs: Not on file  Physical Activity: Not on file  Stress: Not on file  Social Connections: Not on file  Intimate Partner Violence: Not on file    Family History  Problem Relation Age of Onset   Atrial fibrillation Mother    Diabetes Father      Current Outpatient Medications:    acetaminophen (TYLENOL) 500 MG tablet, Take 500 mg by mouth every 6 (six) hours as needed., Disp: , Rfl:    atorvastatin (LIPITOR) 20 MG tablet, Take 20 mg by  mouth daily., Disp: , Rfl:    CALCIUM-MAGNESUIUM-ZINC 333-133-8.3 MG TABS, Take by mouth., Disp: , Rfl:    diclofenac Sodium (VOLTAREN) 1 % GEL, Apply topically., Disp: , Rfl:    docusate sodium (COLACE) 100 MG capsule, Take 1 tablet once or twice daily as needed for constipation while taking narcotic pain medicine, Disp: 30 capsule, Rfl: 0   dutasteride (AVODART) 0.5 MG capsule, Take by mouth daily., Disp: , Rfl:    empagliflozin (JARDIANCE) 10 MG TABS tablet, Take 25 mg by mouth daily., Disp: , Rfl:    furosemide (LASIX) 20 MG tablet, Take by mouth., Disp: , Rfl:    lisinopril (ZESTRIL) 10 MG tablet, Take 10 mg by mouth daily., Disp: , Rfl:    methocarbamol (ROBAXIN) 750 MG tablet, Take by mouth., Disp: , Rfl:    Multiple Vitamin (MULTI-VITAMIN) tablet, Take 1 tablet by mouth daily., Disp: , Rfl:    omeprazole (PRILOSEC) 40 MG capsule, Take 40 mg by mouth daily., Disp: , Rfl:    ondansetron (ZOFRAN ODT) 4 MG disintegrating tablet, Take 1 tablet (4 mg total) by mouth every 8 (eight) hours as needed for nausea or vomiting., Disp: 20 tablet, Rfl: 0  ondansetron (ZOFRAN) 8 MG tablet, Take by mouth., Disp: , Rfl:    potassium chloride (KLOR-CON) 10 MEQ tablet, Take by mouth., Disp: , Rfl:    pregabalin (LYRICA) 75 MG capsule, Take 75 mg by mouth. Takes 171m in am and 722mat night, Disp: , Rfl:    propranolol (INDERAL) 20 MG tablet, Take 20 mg by mouth 2 (two) times daily., Disp: , Rfl:    Semaglutide,0.25 or 0.5MG/DOS, (OZEMPIC, 0.25 OR 0.5 MG/DOSE,) 2 MG/1.5ML SOPN, Inject into the skin. Takes every 7 days, Disp: , Rfl:    ascorbic acid (VITAMIN C) 500 MG tablet, Take by mouth. (Patient not taking: Reported on 06/24/2020), Disp: , Rfl:    atorvastatin (LIPITOR) 10 MG tablet, Take 10 mg by mouth daily. (Patient not taking: Reported on 06/30/2021), Disp: , Rfl:    eszopiclone (LUNESTA) 2 MG TABS tablet, Take by mouth as needed. (Patient not taking: Reported on 12/31/2020), Disp: , Rfl:    sucralfate  (CARAFATE) 1 g tablet, Take by mouth 2 (two) times daily. (Patient not taking: Reported on 09/26/2020), Disp: , Rfl:    XIFAXAN 550 MG TABS tablet, Take 550 mg by mouth 3 (three) times daily. (Patient not taking: Reported on 12/31/2020), Disp: , Rfl:   Physical exam:  Vitals:   06/30/21 1111  BP: (!) 141/79  Pulse: 71  Resp: 20  Temp: 98.1 F (36.7 C)  SpO2: 100%  Weight: 271 lb 11.2 oz (123.2 kg)   Physical Exam Constitutional:      General: He is not in acute distress. Cardiovascular:     Rate and Rhythm: Normal rate and regular rhythm.     Heart sounds: Normal heart sounds.  Pulmonary:     Effort: Pulmonary effort is normal.     Breath sounds: Normal breath sounds.  Abdominal:     General: Bowel sounds are normal.     Palpations: Abdomen is soft.  Musculoskeletal:     Comments: Bilateral +1 edema  Skin:    General: Skin is warm and dry.  Neurological:     Mental Status: He is alert and oriented to person, place, and time.        Latest Ref Rng & Units 06/30/2021   10:48 AM  CMP  Glucose 70 - 99 mg/dL 188    BUN 6 - 20 mg/dL 17    Creatinine 0.61 - 1.24 mg/dL 1.00    Sodium 135 - 145 mmol/L 137    Potassium 3.5 - 5.1 mmol/L 3.7    Chloride 98 - 111 mmol/L 102    CO2 22 - 32 mmol/L 28    Calcium 8.9 - 10.3 mg/dL 8.6    Total Protein 6.5 - 8.1 g/dL 6.8    Total Bilirubin 0.3 - 1.2 mg/dL 2.0    Alkaline Phos 38 - 126 U/L 63    AST 15 - 41 U/L 48    ALT 0 - 44 U/L 35        Latest Ref Rng & Units 06/30/2021   10:48 AM  CBC  WBC 4.0 - 10.5 K/uL 3.9    Hemoglobin 13.0 - 17.0 g/dL 15.1    Hematocrit 39.0 - 52.0 % 45.5    Platelets 150 - 400 K/uL 69       Assessment and plan- Patient is a [age].o. male here for routine follow-up of leukopenia and thrombocytopenia  Patient's white cell count has remained stable between 3-4 and platelet count stable between 60s to 70s for  the last couple of years.  Suspect this is secondary to cirrhosis.  Hemoglobin is normal.   Does not require a bone marrow biopsy at this time.I will repeat CBC ferritin and iron studies in 6 months in 1 year and see him back in 1 year.  Labs done today are suggestive of iron deficiency when her hemoglobin is normal.  Have asked him to take oral iron every other day   Visit Diagnosis 1. Iron deficiency anemia, unspecified iron deficiency anemia type   2. Pancytopenia ([place])      Dr. Randa Evens, MD, MPH [facility] [phone] 06/30/2021 4:58 PM

## 2021-06-30 NOTE — Progress Notes (Signed)
Pt did not answer phone; left VM informing to take oral iron that can be purchased OTC. If any questions please return my call. Will send MyChart as well.

## 2021-07-01 ENCOUNTER — Telehealth: Payer: BC Managed Care – PPO | Admitting: Oncology

## 2021-10-08 DIAGNOSIS — K6389 Other specified diseases of intestine: Principal | ICD-10-CM

## 2021-10-08 MED ORDER — RIFAXIMIN 550 MG TABLET
ORAL_TABLET | Freq: Three times a day (TID) | ORAL | 0 refills | 14 days | Status: CP
Start: 2021-10-08 — End: 2021-10-22
  Filled 2021-10-13: qty 42, 14d supply, fill #0

## 2021-10-08 NOTE — Unmapped (Signed)
[facility] Specialty Medication Onboarding    Specialty Medication: XIFAXAN 550 mg Tab (rifAXIMin)  Prior Authorization: Not Required   Financial Assistance: No - copay  <$25  Final Copay/Day Supply: $0 / 14    Insurance Restrictions: None     Notes to Pharmacist:     The triage team has completed the benefits investigation and has determined that the patient is able to fill this medication at [facility]. Please contact the patient to complete the onboarding or follow up with the prescribing physician as needed.

## 2021-10-09 NOTE — Unmapped (Signed)
[facility]   Patient Onboarding/Medication Counseling    Tyler Lucas is a 61 y.o. male with SIBO who I am counseling today on initiation of therapy.  I am speaking to the patient.    Was a Nurse, learning disability used for this call? No    Verified patient's date of birth / HIPAA.    Specialty medication(s) to be sent: Infectious Disease: Xifaxan      Non-specialty medications/supplies to be sent: n/a      Medications not needed at this time: na/         Xifaxan (rifaximin) 550mg  tablets    The patient declined counseling on medication administration, missed dose instructions, goals of therapy, side effects and monitoring parameters, warnings and precautions, drug/food interactions, and storage, handling precautions, and disposal because they have taken the medication previously. The information in the declined sections below are for informational purposes only and was not discussed with patient.       Medication & Administration     Dosage: SIBO: Take one 550mg  tablet by mouth 3 times daily for 14 days.    Administration: Take with or without food.    Adherence/Missed dose instructions:   Take missed dose as soon as you remember. If it is close to the time of your next dose, skip the missed dose and resume with your next scheduled dose.  Do not take extra doses or 2 doses at the same time.    Goals of Therapy     SIBO: To reduce the bacterial overgrowth in the small intestine with improvements signs and symptoms such as: 1. Loss of appetite 2. Abdominal pain 3. Nausea 4. Bloating 5. An uncomfortable feeling of fullness after eating 6. Diarrhea 7. Unintentional weight loss 8. Malnutrition    Side Effects & Monitoring Parameters     Common Side Effects:   Peripheral edema  Nausea  Dizziness  Fatigue  Ascites    The following side effects should be reported to the provider:  Signs of an allergic reaction, such as rash; hives; itching; red, swollen, blistered, or peeling skin with or without fever. If you have wheezing; tightness in the chest or throat; trouble breathing, swallowing, or talking; unusual hoarseness; or swelling of the mouth, face, lips, tongue, or throat, call 911 or go to the closest emergency department (ED).   Swelling in the arms, legs or stomach.   Feeling very tired or weak.  Low mood (depression).   Fever.   Diarrhea is common with antibiotics. Rarely, a severe form called C diff-associated diarrhea (CDAD) may happen. Sometimes this has led to a deadly bowel problem (colitis). CDAD may happen during or a few months after taking antibiotics. Call your doctor right away if you have stomach pain, cramps, or very loose, watery, or bloody stools. Check with your doctor before treating diarrhea.    Monitoring Parameters:     SIBO: improvement in symptoms and normalization of breath test      Contraindications, Warnings, & Precautions     Superinfection: Prolonged use may result in fungal or bacterial superinfection, including Clostridioides (formerly Clostridium) difficile-associated diarrhea (CDAD) and pseudomembranous colitis; CDAD has been observed >2 months post-antibiotic treatment.  Severe (Child Pugh Class C) Hepatic Impairment: increased systemic exposure with severe hepatic impairment.  Concomitant use with P-glycoprotein (P-gp) inhibitors: P-gp inhibitors may increase systemic exposure of rifaximin.    Drug/Food Interactions     Medication list reviewed in Epic. The patient was instructed to inform the care team before  taking any new medications or supplements. No drug interactions identified.   Warfarin: monitor INR and prothrombin time; Dose adjustment of warfarin may be needed to maintain target INR range.    Storage, Handling Precautions, & Disposal     Store this medication at room temperature.  Store in a dry place. Do not store in a bathroom.   Keep all drugs out of the reach of children and pets.  Throw away unused or expired drugs. Do not flush down a toilet or pour down a drain unless you are told to do so. Check with your pharmacist if you have questions about the best way to throw out drugs. There may be drug take-back programs in your area.      Current Medications (including OTC/herbals), Comorbidities and Allergies     Current Outpatient Medications   Medication Sig Dispense Refill    atorvastatin (LIPITOR) 20 MG tablet Take 1 tablet (20 mg total) by mouth daily.      acetaminophen (TYLENOL) 325 MG tablet       ascorbic acid, vitamin C, (VITAMIN C) 500 MG tablet Take 1-2 tablets (500-1,000 mg total) by mouth.      diclofenac sodium (VOLTAREN) 1 % gel Apply 4 g topically.      dutasteride (AVODART) 0.5 mg capsule Take by mouth.      empagliflozin (JARDIANCE) 25 mg tablet Take 25 mg by mouth.      eszopiclone (LUNESTA) 2 MG Tab       furosemide (LASIX) 20 MG tablet Take 1 tablet (20 mg total) by mouth daily as needed.      lisinopriL (PRINIVIL,ZESTRIL) 10 MG tablet Take 1 tablet (10 mg total) by mouth daily.      methocarbamoL (ROBAXIN) 750 MG tablet Take 1 tablet (750 mg total) by mouth in the morning.      multivitamin (TAB-A-VITE/THERAGRAN) per tablet Take 1 tablet by mouth daily.      omeprazole (PRILOSEC) 40 MG capsule Take 1 capsule (40 mg total) by mouth daily.      ondansetron (ZOFRAN) 8 MG tablet Take 1 tablet (8 mg total) by mouth every eight (8) hours as needed.      pregabalin (LYRICA) 75 MG capsule Take 1 capsule (75 mg total) by mouth Two (2) times a day. 150mg  am and 75mg  pm      propranoloL (INDERAL) 20 MG tablet Take 1 tablet (20 mg total) by mouth Two (2) times a day.      rifAXIMin (XIFAXAN) 550 mg Tab Take 1 tablet (550 mg total) by mouth Three (3) times a day for 14 days. 42 tablet 0    semaglutide (OZEMPIC) 0.25 mg or 0.5 mg(2 mg/1.5 mL) PnIj injection Inject 0.5 mg under the skin once a week.       No current facility-administered medications for this visit.       Allergies   Allergen Reactions    Adhesive Other (See Comments) and Itching     Oozing to skin  Other reaction(s): Other (See Comments), Other (See Comments)  Burning, oozing   CAN USE TEGADERM  Oozing to skin  Burning, oozing   CAN USE TEGADERM      Nsaids (Non-Steroidal Anti-Inflammatory Drug)      Other reaction(s): Other (See Comments)  Upper GI bleed    Quetiapine Other (See Comments)     Uncontrolled leg movements  Other reaction(s): Other (See Comments), Other (See Comments), Other (See Comments)  Leg spasms  uncontrollable leg kicking  Uncontrolled leg movements  Leg spasms  uncontrollable leg kicking      Sumatriptan Other (See Comments) and Anxiety     shaking  Other reaction(s): Other (See Comments), Other (See Comments)  Shaky, body aches  shaking  shaking  Shaky, body aches      Adhesive Tape-Silicones Rash       There is no problem list on file for this patient.      Reviewed and up to date in Epic.    Appropriateness of Therapy     Acute infections noted within Epic:  No active infections  Patient reported infection: None    Is medication and dose appropriate based on diagnosis and infection status? Yes    Prescription has been clinically reviewed: Yes      Baseline Quality of Life Assessment      How many days over the past month did your SIBO  keep you from your normal activities? For example, brushing your teeth or getting up in the morning. 0    Financial Information     Medication Assistance provided: None Required    Anticipated copay of $0.00 reviewed with patient. Verified delivery address.    Delivery Information     Scheduled delivery date: 10/13/21    Expected start date: 10/13/21    Medication will be delivered via Same Day Courier to the prescription address in [facility].  This shipment will not require a signature.      Explained the services we provide at [facility] and that each month we would call to set up refills.  Stressed importance of returning phone calls so that we could ensure they receive their medications in time each month.  Informed patient that we should be setting up refills 7-10 days prior to when they will run out of medication.  A pharmacist will reach out to perform a clinical assessment periodically.  Informed patient that a welcome packet, containing information about our pharmacy and other support services, a Notice of Privacy Practices, and a drug information handout will be sent.      The patient or caregiver noted above participated in the development of this care plan and knows that they can request review of or adjustments to the care plan at any time.      Patient or caregiver verbalized understanding of the above information as well as how to contact the pharmacy at [phone] option 4 with any questions/concerns.  The pharmacy is open Monday through Friday 8:30am-4:30pm.  A pharmacist is available 24/7 via pager to answer any clinical questions they may have.    Patient Specific Needs     Does the patient have any physical, cognitive, or cultural barriers? No    Does the patient have adequate living arrangements? (i.e. the ability to store and take their medication appropriately) Yes    Did you identify any home environmental safety or security hazards? No    Patient prefers to have medications discussed with  Patient     Is the patient or caregiver able to read and understand education materials at a high school level or above? Yes    Patient's primary language is  English     Is the patient high risk? No    SOCIAL DETERMINANTS OF HEALTH     At the [facility], we have learned that life circumstances - like trouble affording food, housing, utilities, or transportation can affect the health of many of our patients.  That is why we wanted to ask: are you currently experiencing any life circumstances that are negatively impacting your health and/or quality of life? Patient declined to answer    Social Determinants of Health     Financial Resource Strain: Not on file   Internet Connectivity: Not on file   Food Insecurity: Not on file   Tobacco Use: Medium Risk (04/27/2021)    Patient History     Smoking Tobacco Use: Former     Smokeless Tobacco Use: Never     Passive Exposure: Not on file   Housing/Utilities: Not on file   Alcohol Use: Not on file   Transportation Needs: Not on file   Substance Use: Not on file   Health Literacy: Not on file   Physical Activity: Not on file   Interpersonal Safety: Not on file   Stress: Not on file   Intimate Partner Violence: Not on file   Depression: Not on file   Social Connections: Not on file       Would you be willing to receive help with any of the needs that you have identified today? Not applicable       Roderic Palau  [facility] Specialty Pharmacist

## 2021-10-13 NOTE — Unmapped (Signed)
Specialty Medication(s): Xifaxan 550mg     Mr.Clack has been dis-enrolled from the [facility] specialty pharmacy services due to upcomming therapy completion - expected therapy completion date: 10/27/21.    Additional information provided to the patient: n/a    Roderic Palau  [facility] Specialty Pharmacist

## 2021-10-22 ENCOUNTER — Emergency Department: Payer: BC Managed Care – PPO

## 2021-10-22 ENCOUNTER — Other Ambulatory Visit: Payer: Self-pay

## 2021-10-22 ENCOUNTER — Emergency Department
Admission: EM | Admit: 2021-10-22 | Discharge: 2021-10-22 | Disposition: A | Discharge status: Home / Self Care | Payer: BC Managed Care – PPO | Attending: Emergency Medicine | Admitting: Emergency Medicine

## 2021-10-22 DIAGNOSIS — R1010 Upper abdominal pain, unspecified: Secondary | ICD-10-CM

## 2021-10-22 DIAGNOSIS — R1011 Right upper quadrant pain: Secondary | ICD-10-CM | POA: Diagnosis present

## 2021-10-22 DIAGNOSIS — I10 Essential (primary) hypertension: Secondary | ICD-10-CM | POA: Insufficient documentation

## 2021-10-22 DIAGNOSIS — R410 Disorientation, unspecified: Secondary | ICD-10-CM | POA: Diagnosis not present

## 2021-10-22 DIAGNOSIS — E119 Type 2 diabetes mellitus without complications: Secondary | ICD-10-CM | POA: Insufficient documentation

## 2021-10-22 DIAGNOSIS — R197 Diarrhea, unspecified: Secondary | ICD-10-CM | POA: Diagnosis not present

## 2021-10-22 DIAGNOSIS — R110 Nausea: Secondary | ICD-10-CM | POA: Diagnosis not present

## 2021-10-22 DIAGNOSIS — K859 Acute pancreatitis without necrosis or infection, unspecified: Secondary | ICD-10-CM

## 2021-10-22 LAB — COMPREHENSIVE METABOLIC PANEL
ALT: 37 U/L (ref 0–44)
AST: 37 U/L (ref 15–41)
Albumin: 3.6 g/dL (ref 3.5–5.0)
Alkaline Phosphatase: 68 U/L (ref 38–126)
Anion gap: 7 (ref 5–15)
BUN: 18 mg/dL (ref 6–20)
CO2: 24 mmol/L (ref 22–32)
Calcium: 9.1 mg/dL (ref 8.9–10.3)
Chloride: 108 mmol/L (ref 98–111)
Creatinine, Ser: 0.73 mg/dL (ref 0.61–1.24)
GFR, Estimated: >60 mL/min (ref >60)
Glucose, Bld: 206 mg/dL — ABNORMAL HIGH (ref 70–99)
Potassium: 4.1 mmol/L (ref 3.5–5.1)
Sodium: 139 mmol/L (ref 135–145)
Total Bilirubin: 2.3 mg/dL — ABNORMAL HIGH (ref 0.3–1.2)
Total Protein: 6.2 g/dL — ABNORMAL LOW (ref 6.5–8.1)

## 2021-10-22 LAB — URINALYSIS, ROUTINE W REFLEX MICROSCOPIC
Bacteria, UA: NONE SEEN
Bilirubin Urine: NEGATIVE
Glucose, UA: >=500 mg/dL — AB
Hgb urine dipstick: NEGATIVE
Ketones, ur: NEGATIVE mg/dL
Leukocytes,Ua: NEGATIVE
Nitrite: NEGATIVE
Protein, ur: NEGATIVE mg/dL
Specific Gravity, Urine: 1.03 (ref 1.005–1.030)
Squamous Epithelial / HPF: NONE SEEN (ref 0–5)
pH: 5 (ref 5.0–8.0)

## 2021-10-22 LAB — CBC
HCT: 45 % (ref 39.0–52.0)
Hemoglobin: 15.3 g/dL (ref 13.0–17.0)
MCH: 29.8 pg (ref 26.0–34.0)
MCHC: 34 g/dL (ref 30.0–36.0)
MCV: 87.5 fL (ref 80.0–100.0)
Platelets: 52 10*3/uL — ABNORMAL LOW (ref 150–400)
RBC: 5.14 MIL/uL (ref 4.22–5.81)
RDW: 14 % (ref 11.5–15.5)
WBC: 4 10*3/uL (ref 4.0–10.5)
nRBC: 0 % (ref 0.0–0.2)

## 2021-10-22 LAB — AMMONIA: Ammonia: 22 umol/L (ref 9–35)

## 2021-10-22 LAB — LIPASE, BLOOD: Lipase: 86 U/L — ABNORMAL HIGH (ref 11–51)

## 2021-10-22 MED ORDER — PROCHLORPERAZINE EDISYLATE 10 MG/2ML IJ SOLN
10.0000 mg | Freq: Once | INTRAMUSCULAR | Status: AC
  Administered 2021-10-22: 10 mg via INTRAVENOUS
  Filled 2021-10-22: qty 2

## 2021-10-22 MED ORDER — IOHEXOL 300 MG/ML  SOLN
100.0000 mL | Freq: Once | INTRAMUSCULAR | Status: AC | PRN
  Administered 2021-10-22: 100 mL via INTRAVENOUS

## 2021-10-22 MED ORDER — OXYCODONE HCL 5 MG PO TABS: 5.0000 mg | ORAL_TABLET | Freq: Three times a day (TID) | ORAL | 0 refills | Status: DC | PRN

## 2021-10-22 NOTE — ED Provider Notes (Signed)
-----------------------------------------   9:20 PM on 10/22/2021 ----------------------------------------- Patient care assumed from physician assistant Ashok Cordia.  Patient's work-up overall reassuring, CT scan does not appear to show any acute finding, ultrasound shows no signs of cholecystitis.  Patient's lipase is mildly elevated on my examination patient only has very mild epigastric tenderness remainder the abdomen is benign.  Suspect mild pancreatitis.  Patient denies alcohol use.  I discussed with the patient clear liquid diet for the next 48 hours with slow progression of diet we will provide a pancreatitis eating plan.  I will prescribe a very short course of pain medication for the patient.  Patient agreeable to this plan of care.   Harvest Dark, MD 10/22/21 2123

## 2021-10-22 NOTE — ED Triage Notes (Signed)
PT coming POV today with Ab pain rt recent dx of gallstones, cirrhosis and enlarged spleen. Pt has been being treated for approx 1 month but pain increased starting yesterday. Diarrhea  and nausea started three weeks ago. PT endorsing recently dizziness.

## 2021-10-22 NOTE — ED Provider Notes (Signed)
[facility] Provider Note    Event Date/Time   First MD Initiated Contact with Patient 10/22/21 1810     (approximate)   History   Abdominal Pain   HPI  Juan Garza is a 61 y.o. male with history of diabetes, nonalcoholic cirrhosis, hypertension, varices of the liver, presents emergency department complaining of worsening right upper quadrant pain.  States he has had a lot of nausea which his regular medications have not helped.  He states that Zofran and Reglan have not helped at all.  States he is unsure if his abdomen is more swollen than normal.  States they told him it was mild ascites the last time he was checked.  Is also had some confusion.  Denies fever or chills.  States he did get a cold sweat yesterday.  Does continue to have diarrhea for about 2 weeks.      Physical Exam   Triage Vital Signs: ED Triage Vitals  Enc Vitals Group     BP 10/22/21 1452 (!) 135/57     Pulse Rate 10/22/21 1452 81     Resp 10/22/21 1452 18     Temp 10/22/21 1452 98 F (36.7 C)     Temp Source 10/22/21 1452 Oral     SpO2 10/22/21 1452 95 %     Weight 10/22/21 1453 270 lb (122.5 kg)     Height --      Head Circumference --      Peak Flow --      Pain Score 10/22/21 1453 6     Pain Loc --      Pain Edu? --      Excl. in [place]? --     Most recent vital signs: Vitals:   10/22/21 1452 10/22/21 1815  BP: (!) 135/57 (!) 132/59  Pulse: 81 73  Resp: 18 19  Temp: 98 F (36.7 C)   SpO2: 95% 98%     General: Awake, no distress.   CV:  Good peripheral perfusion. regular rate and  rhythm Resp:  Normal effort. Lungs CTA Abd: Large abdomen noted, tender in the right upper quadrant and epigastric area, bowel sounds present Other:      ED Results / Procedures / Treatments   Labs (all labs ordered are listed, but only abnormal results are displayed) Labs Reviewed  LIPASE, BLOOD - Abnormal; Notable for the following components:      Result Value   Lipase 86  (*)    All other components within normal limits  COMPREHENSIVE METABOLIC PANEL - Abnormal; Notable for the following components:   Glucose, Bld 206 (*)    Total Protein 6.2 (*)    Total Bilirubin 2.3 (*)    All other components within normal limits  CBC - Abnormal; Notable for the following components:   Platelets 52 (*)    All other components within normal limits  URINALYSIS, ROUTINE W REFLEX MICROSCOPIC - Abnormal; Notable for the following components:   Color, Urine YELLOW (*)    APPearance CLEAR (*)    Glucose, UA >=500 (*)    All other components within normal limits  AMMONIA     EKG     RADIOLOGY Present on right upper quadrant, CT abdomen/pelvis IV contrast    PROCEDURES:   Procedures   MEDICATIONS ORDERED IN ED: Medications  prochlorperazine (COMPAZINE) injection 10 mg (has no administration in time range)     IMPRESSION / MDM / ASSESSMENT AND PLAN / ED COURSE  I reviewed the triage vital signs and the nursing notes.                              Differential diagnosis includes, but is not limited to, ascites, acute cholecystitis, pancreatitis, liver failure, bowel obstruction, viral gastroenteritis  Patient's presentation is most consistent with acute complicated illness / injury requiring diagnostic workup.   Patient's lipase is mildly elevated 86, CBC metabolic panel and urinalysis are reassuring.  Even though the total bilirubin is elevated 2.3 this is in the patient's normal trend when his old charts were reviewed  Ultrasound right upper quadrant, CT abdomen/pelvis IV contrast   Since patient is not tolerating oral Zofran and Reglan we will try IV Compazine  Care transferred to dr Kerman Passey  FINAL CLINICAL IMPRESSION(S) / ED DIAGNOSES   Final diagnoses:  Pain of upper abdomen  Nausea     Rx / DC Orders   ED Discharge Orders     None        Note:  This document was prepared using Dragon voice recognition software and may  include unintentional dictation errors.    Versie Starks, PA-C 10/22/21 1917    Harvest Dark, MD 10/22/21 2240

## 2021-11-03 ENCOUNTER — Ambulatory Visit: Admit: 2021-11-03 | Discharge: 2021-11-04 | Payer: PRIVATE HEALTH INSURANCE

## 2021-11-03 DIAGNOSIS — K7460 Unspecified cirrhosis of liver: Principal | ICD-10-CM

## 2021-11-03 MED ORDER — CARVEDILOL 6.25 MG TABLET
ORAL_TABLET | Freq: Two times a day (BID) | ORAL | 11 refills | 30 days | Status: CP
Start: 2021-11-03 — End: ?

## 2021-12-10 ENCOUNTER — Encounter: Payer: Self-pay | Admitting: Hematology and Oncology

## 2021-12-10 NOTE — Telephone Encounter (Signed)
Signing encounter, see note 02/21/20

## 2022-01-04 ENCOUNTER — Inpatient Hospital Stay: Payer: BC Managed Care – PPO | Attending: Oncology

## 2022-01-04 DIAGNOSIS — D696 Thrombocytopenia, unspecified: Secondary | ICD-10-CM | POA: Insufficient documentation

## 2022-01-04 DIAGNOSIS — D72829 Elevated white blood cell count, unspecified: Secondary | ICD-10-CM | POA: Insufficient documentation

## 2022-01-04 DIAGNOSIS — D509 Iron deficiency anemia, unspecified: Secondary | ICD-10-CM

## 2022-01-04 LAB — CBC WITH DIFFERENTIAL/PLATELET
Abs Immature Granulocytes: 0 10*3/uL (ref 0.00–0.07)
Basophils Absolute: 0 10*3/uL (ref 0.0–0.1)
Basophils Relative: 1 %
Eosinophils Absolute: 0 10*3/uL (ref 0.0–0.5)
Eosinophils Relative: 1 %
HCT: 48.8 % (ref 39.0–52.0)
Hemoglobin: 16.8 g/dL (ref 13.0–17.0)
Immature Granulocytes: 0 %
Lymphocytes Relative: 29 %
Lymphs Abs: 0.8 10*3/uL (ref 0.7–4.0)
MCH: 29.8 pg (ref 26.0–34.0)
MCHC: 34.4 g/dL (ref 30.0–36.0)
MCV: 86.7 fL (ref 80.0–100.0)
Monocytes Absolute: 0.3 10*3/uL (ref 0.1–1.0)
Monocytes Relative: 11 %
Neutro Abs: 1.6 10*3/uL — ABNORMAL LOW (ref 1.7–7.7)
Neutrophils Relative %: 58 %
Platelets: 48 10*3/uL — ABNORMAL LOW (ref 150–400)
RBC: 5.63 MIL/uL (ref 4.22–5.81)
RDW: 14.2 % (ref 11.5–15.5)
WBC: 2.8 10*3/uL — ABNORMAL LOW (ref 4.0–10.5)
nRBC: 0 % (ref 0.0–0.2)

## 2022-01-04 LAB — IRON AND TIBC
Iron: 130 ug/dL (ref 45–182)
Saturation Ratios: 32 % (ref 17.9–39.5)
TIBC: 407 ug/dL (ref 250–450)
UIBC: 277 ug/dL

## 2022-01-04 LAB — FERRITIN: Ferritin: 63 ng/mL (ref 24–336)

## 2022-01-28 ENCOUNTER — Encounter: Payer: Self-pay | Admitting: Emergency Medicine

## 2022-01-28 ENCOUNTER — Emergency Department: Payer: BC Managed Care – PPO

## 2022-01-28 ENCOUNTER — Observation Stay
Admission: EM | Admit: 2022-01-28 | Discharge: 2022-02-01 | Disposition: A | Discharge status: Home / Self Care | Payer: BC Managed Care – PPO | Attending: Internal Medicine | Admitting: Internal Medicine

## 2022-01-28 ENCOUNTER — Other Ambulatory Visit: Payer: Self-pay

## 2022-01-28 DIAGNOSIS — I10 Essential (primary) hypertension: Secondary | ICD-10-CM | POA: Diagnosis not present

## 2022-01-28 DIAGNOSIS — E0590 Thyrotoxicosis, unspecified without thyrotoxic crisis or storm: Secondary | ICD-10-CM | POA: Diagnosis not present

## 2022-01-28 DIAGNOSIS — K7469 Other cirrhosis of liver: Secondary | ICD-10-CM | POA: Insufficient documentation

## 2022-01-28 DIAGNOSIS — I4891 Unspecified atrial fibrillation: Secondary | ICD-10-CM | POA: Diagnosis present

## 2022-01-28 DIAGNOSIS — K219 Gastro-esophageal reflux disease without esophagitis: Secondary | ICD-10-CM | POA: Diagnosis not present

## 2022-01-28 DIAGNOSIS — E1165 Type 2 diabetes mellitus with hyperglycemia: Secondary | ICD-10-CM | POA: Diagnosis not present

## 2022-01-28 DIAGNOSIS — K7460 Unspecified cirrhosis of liver: Secondary | ICD-10-CM | POA: Diagnosis present

## 2022-01-28 DIAGNOSIS — D696 Thrombocytopenia, unspecified: Secondary | ICD-10-CM | POA: Diagnosis not present

## 2022-01-28 LAB — HEPATIC FUNCTION PANEL
ALT: 42 U/L (ref 0–44)
AST: 50 U/L — ABNORMAL HIGH (ref 15–41)
Albumin: 4 g/dL (ref 3.5–5.0)
Alkaline Phosphatase: 73 U/L (ref 38–126)
Bilirubin, Direct: 0.6 mg/dL — ABNORMAL HIGH (ref 0.0–0.2)
Indirect Bilirubin: 3.1 mg/dL — ABNORMAL HIGH (ref 0.3–0.9)
Total Bilirubin: 3.7 mg/dL — ABNORMAL HIGH (ref 0.3–1.2)
Total Protein: 6.6 g/dL (ref 6.5–8.1)

## 2022-01-28 LAB — CBC
HCT: 49.8 % (ref 39.0–52.0)
Hemoglobin: 17 g/dL (ref 13.0–17.0)
MCH: 29.7 pg (ref 26.0–34.0)
MCHC: 34.1 g/dL (ref 30.0–36.0)
MCV: 87.1 fL (ref 80.0–100.0)
Platelets: 54 10*3/uL — ABNORMAL LOW (ref 150–400)
RBC: 5.72 MIL/uL (ref 4.22–5.81)
RDW: 14.4 % (ref 11.5–15.5)
WBC: 4.5 10*3/uL (ref 4.0–10.5)
nRBC: 0 % (ref 0.0–0.2)

## 2022-01-28 LAB — BASIC METABOLIC PANEL
Anion gap: 11 (ref 5–15)
BUN: 15 mg/dL (ref 6–20)
CO2: 22 mmol/L (ref 22–32)
Calcium: 9.1 mg/dL (ref 8.9–10.3)
Chloride: 105 mmol/L (ref 98–111)
Creatinine, Ser: 0.57 mg/dL — ABNORMAL LOW (ref 0.61–1.24)
GFR, Estimated: >60 mL/min (ref >60)
Glucose, Bld: 251 mg/dL — ABNORMAL HIGH (ref 70–99)
Potassium: 3.9 mmol/L (ref 3.5–5.1)
Sodium: 138 mmol/L (ref 135–145)

## 2022-01-28 LAB — TROPONIN I (HIGH SENSITIVITY): Troponin I (High Sensitivity): 7 ng/L (ref <18)

## 2022-01-28 LAB — BRAIN NATRIURETIC PEPTIDE: B Natriuretic Peptide: 43.6 pg/mL (ref 0.0–100.0)

## 2022-01-28 LAB — TSH: TSH: <0.01 u[IU]/mL — ABNORMAL LOW (ref 0.350–4.500)

## 2022-01-28 LAB — CBG MONITORING, ED: Glucose-Capillary: 169 mg/dL — ABNORMAL HIGH (ref 70–99)

## 2022-01-28 LAB — MAGNESIUM: Magnesium: 1.9 mg/dL (ref 1.7–2.4)

## 2022-01-28 MED ORDER — CARVEDILOL 6.25 MG PO TABS
6.2500 mg | ORAL_TABLET | Freq: Two times a day (BID) | ORAL | Status: DC
  Administered 2022-01-28 – 2022-02-01 (×8): 6.25 mg via ORAL
  Filled 2022-01-28 (×8): qty 1

## 2022-01-28 MED ORDER — PREGABALIN 75 MG PO CAPS
75.0000 mg | ORAL_CAPSULE | Freq: Every day | ORAL | Status: DC
  Filled 2022-01-28: qty 1

## 2022-01-28 MED ORDER — ACETAMINOPHEN 500 MG PO TABS
500.0000 mg | ORAL_TABLET | Freq: Four times a day (QID) | ORAL | Status: DC | PRN
  Administered 2022-01-28 – 2022-01-31 (×3): 500 mg via ORAL
  Filled 2022-01-28 (×3): qty 1

## 2022-01-28 MED ORDER — ONDANSETRON HCL 4 MG/2ML IJ SOLN
4.0000 mg | Freq: Once | INTRAMUSCULAR | Status: AC
  Administered 2022-01-28: 4 mg via INTRAVENOUS
  Filled 2022-01-28: qty 2

## 2022-01-28 MED ORDER — DOCUSATE SODIUM 100 MG PO CAPS: 100.0000 mg | ORAL_CAPSULE | Freq: Two times a day (BID) | ORAL | Status: DC | PRN

## 2022-01-28 MED ORDER — MAGNESIUM SULFATE 2 GM/50ML IV SOLN
2.0000 g | Freq: Once | INTRAVENOUS | Status: AC
  Administered 2022-01-28: 2 g via INTRAVENOUS
  Filled 2022-01-28: qty 50

## 2022-01-28 MED ORDER — ATORVASTATIN CALCIUM 20 MG PO TABS
20.0000 mg | ORAL_TABLET | Freq: Every evening | ORAL | Status: DC
  Administered 2022-01-28 – 2022-01-31 (×4): 20 mg via ORAL
  Filled 2022-01-28 (×4): qty 1

## 2022-01-28 MED ORDER — POTASSIUM CHLORIDE CRYS ER 10 MEQ PO TBCR
10.0000 meq | EXTENDED_RELEASE_TABLET | Freq: Every day | ORAL | Status: DC
  Administered 2022-01-29 – 2022-02-01 (×4): 10 meq via ORAL
  Filled 2022-01-28 (×5): qty 1

## 2022-01-28 MED ORDER — ADULT MULTIVITAMIN W/MINERALS CH
1.0000 | ORAL_TABLET | Freq: Every day | ORAL | Status: DC
  Administered 2022-01-29 – 2022-02-01 (×4): 1 via ORAL
  Filled 2022-01-28 (×5): qty 1

## 2022-01-28 MED ORDER — FUROSEMIDE 20 MG PO TABS
20.0000 mg | ORAL_TABLET | Freq: Every day | ORAL | Status: DC
  Administered 2022-01-29 – 2022-02-01 (×4): 20 mg via ORAL
  Filled 2022-01-28 (×4): qty 1

## 2022-01-28 MED ORDER — INSULIN ASPART 100 UNIT/ML IJ SOLN
0.0000 [IU] | Freq: Three times a day (TID) | INTRAMUSCULAR | Status: DC
  Administered 2022-01-29: 2 [IU] via SUBCUTANEOUS
  Administered 2022-01-29: 3 [IU] via SUBCUTANEOUS
  Administered 2022-01-29: 5 [IU] via SUBCUTANEOUS
  Administered 2022-01-30 (×2): 3 [IU] via SUBCUTANEOUS
  Administered 2022-01-30 – 2022-01-31 (×2): 5 [IU] via SUBCUTANEOUS
  Administered 2022-01-31: 3 [IU] via SUBCUTANEOUS
  Administered 2022-01-31: 2 [IU] via SUBCUTANEOUS
  Administered 2022-02-01: 8 [IU] via SUBCUTANEOUS
  Administered 2022-02-01: 3 [IU] via SUBCUTANEOUS
  Filled 2022-01-28 (×11): qty 1

## 2022-01-28 MED ORDER — PREGABALIN 75 MG PO CAPS
150.0000 mg | ORAL_CAPSULE | Freq: Every day | ORAL | Status: DC
  Administered 2022-01-28 – 2022-01-31 (×4): 150 mg via ORAL
  Filled 2022-01-28: qty 3
  Filled 2022-01-28 (×3): qty 2

## 2022-01-28 MED ORDER — OXYCODONE HCL 5 MG PO TABS: 5.0000 mg | ORAL_TABLET | Freq: Three times a day (TID) | ORAL | Status: DC | PRN

## 2022-01-28 MED ORDER — DILTIAZEM HCL 25 MG/5ML IV SOLN
10.0000 mg | Freq: Once | INTRAVENOUS | Status: AC
  Administered 2022-01-28: 10 mg via INTRAVENOUS
  Filled 2022-01-28: qty 5

## 2022-01-28 MED ORDER — PREGABALIN 75 MG PO CAPS
75.0000 mg | ORAL_CAPSULE | Freq: Every day | ORAL | Status: DC
  Administered 2022-01-29 – 2022-02-01 (×4): 75 mg via ORAL
  Filled 2022-01-28 (×4): qty 1

## 2022-01-28 MED ORDER — DILTIAZEM HCL-DEXTROSE 125-5 MG/125ML-% IV SOLN (PREMIX)
5.0000 mg/h | INTRAVENOUS | Status: DC
  Administered 2022-01-28: 15 mg/h via INTRAVENOUS
  Administered 2022-01-28: 5 mg/h via INTRAVENOUS
  Filled 2022-01-28 (×2): qty 125

## 2022-01-28 MED ORDER — FUROSEMIDE 10 MG/ML IJ SOLN
40.0000 mg | Freq: Once | INTRAMUSCULAR | Status: AC
  Administered 2022-01-28: 40 mg via INTRAVENOUS
  Filled 2022-01-28: qty 4

## 2022-01-28 MED ORDER — PANTOPRAZOLE SODIUM 40 MG PO TBEC
40.0000 mg | DELAYED_RELEASE_TABLET | Freq: Every day | ORAL | Status: DC
  Administered 2022-01-28 – 2022-02-01 (×4): 40 mg via ORAL
  Filled 2022-01-28 (×4): qty 1

## 2022-01-28 MED ORDER — LISINOPRIL 10 MG PO TABS
10.0000 mg | ORAL_TABLET | Freq: Every day | ORAL | Status: DC
  Administered 2022-01-28 – 2022-02-01 (×5): 10 mg via ORAL
  Filled 2022-01-28 (×5): qty 1

## 2022-01-28 MED ORDER — METHOCARBAMOL 500 MG PO TABS
750.0000 mg | ORAL_TABLET | Freq: Three times a day (TID) | ORAL | Status: DC
  Administered 2022-01-28 – 2022-02-01 (×12): 750 mg via ORAL
  Filled 2022-01-28 (×2): qty 1
  Filled 2022-01-28 (×4): qty 2
  Filled 2022-01-28 (×3): qty 1
  Filled 2022-01-28 (×4): qty 2
  Filled 2022-01-28: qty 1

## 2022-01-28 MED ORDER — PROPRANOLOL HCL 20 MG PO TABS: 20.0000 mg | ORAL_TABLET | Freq: Two times a day (BID) | ORAL | Status: DC

## 2022-01-28 MED ORDER — DUTASTERIDE 0.5 MG PO CAPS
0.5000 mg | ORAL_CAPSULE | Freq: Every day | ORAL | Status: DC
  Administered 2022-01-29 – 2022-02-01 (×4): 0.5 mg via ORAL
  Filled 2022-01-28 (×4): qty 1

## 2022-01-28 MED ORDER — DILTIAZEM HCL 25 MG/5ML IV SOLN
10.0000 mg | Freq: Once | INTRAVENOUS | Status: AC
  Administered 2022-01-28: 10 mg via INTRAVENOUS

## 2022-01-28 NOTE — ED Notes (Signed)
Pt eating lunch

## 2022-01-28 NOTE — ED Triage Notes (Signed)
Patient arrives by POV c/o feeling light headed at work with feeling like his heart was not beating right. Put his pulse ox and noticed his heart rate and oxygen saturation was fluctuating. Denies any chest pain or shortness of breath. Denies any hx of a-fib.

## 2022-01-28 NOTE — Assessment & Plan Note (Signed)
Continue PPI ?

## 2022-01-28 NOTE — ED Notes (Signed)
Pt reports starting to feel nauseous. Dr Charna Archer notified.

## 2022-01-28 NOTE — H&P (Addendum)
History and Physical    Patient: Juan Garza EQA:[id number] DOB: Jun 30, 1960 DOA: 01/28/2022 DOS: the patient was seen and examined on 01/28/2022 PCP: Pcp, No  Patient coming from: Home  Chief Complaint:  Chief Complaint  Patient presents with   Atrial Fibrillation   HPI: [name] is a 61 y.o. male with medical history significant for diabetes mellitus, GERD, history of liver cirrhosis, hypertension who presents to the ER via private vehicle for evaluation of feeling unwell. 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  He went home and checked his pulse oximetry and noted some fluctuation in his oxygen level.  Heart rate was anywhere between 100 and 140. He has had intermittent chest pain mostly midsternal radiating to his jaw but denies having any shortness of breath, no nausea, no vomiting or diaphoresis.  He decided to come to the ER to get evaluated and was found to have new onset A-fib with heart rates in the 150s. He denies having any abdominal pain, no changes in his bowel habits, no urinary symptoms, no headache, no blurred vision, no focal deficit, no weight loss, no night sweats, no double vision. EKG showed A-fib with a rapid ventricular response He was started on a Cardizem drip and is at 5 mg/h Abnormal labs include glucose of 251 and TSH of less than 0.10 He will be admitted to the hospital for further evaluation.   Review of Systems: As mentioned in the history of present illness. All other systems reviewed and are negative. Past Medical History:  Diagnosis Date   Arthritis    Chronic back pain    Cirrhosis ([place])    Colon polyps    Diabetes mellitus without complication (HCC)    GERD (gastroesophageal reflux disease)    GIB (gastrointestinal bleeding)    r/t NSAID use   Gout    Headache    Hyperlipidemia    Hypertension    Right-sided Bell's palsy    Sleep apnea    Past Surgical History:   Procedure Laterality Date   ABDOMINAL SURGERY     APPENDECTOMY     BACK SURGERY     posteriior lumbar spine fusion   CARDIAC CATHETERIZATION     colectomy partial w/anastoamosis     COLON SURGERY     COLONOSCOPY WITH PROPOFOL N/A 01/22/2020   Procedure: COLONOSCOPY WITH PROPOFOL;  Surgeon: Lesly Rubenstein, MD;  Location: [facility] ENDOSCOPY;  Service: Endoscopy;  Laterality: N/A;   ESOPHAGOGASTRODUODENOSCOPY N/A 01/29/2021   Procedure: ESOPHAGOGASTRODUODENOSCOPY (EGD);  Surgeon: Annamaria Helling, DO;  Location: [facility] ENDOSCOPY;  Service: Gastroenterology;  Laterality: N/A;  DM   ESOPHAGOGASTRODUODENOSCOPY (EGD) WITH PROPOFOL N/A 01/22/2020   Procedure: ESOPHAGOGASTRODUODENOSCOPY (EGD) WITH PROPOFOL;  Surgeon: Lesly Rubenstein, MD;  Location: [facility] ENDOSCOPY;  Service: Endoscopy;  Laterality: N/A;   HERNIA REPAIR     JOINT REPLACEMENT     KNEE SURGERY Left    NASAL SINUS SURGERY     Social History:  reports that he has quit smoking. He has never used smokeless tobacco. He reports that he does not drink alcohol and does not use drugs.  Allergies  Allergen Reactions   Quetiapine Other (See Comments)    Uncontrolled leg movements Other reaction(s): Other (See Comments), Other (See Comments), Other (See Comments) Leg spasms "uncontrollable leg kicking" Uncontrolled leg movements Leg spasms "uncontrollable leg kicking"   Nsaids Other (See Comments)   Adhesive [Tape] Itching and Rash   Imitrex [  Sumatriptan] Other (See Comments)    shaking   Seroquel [Quetiapine Fumarate] Other (See Comments)    Leg spasms    Family History  Problem Relation Age of Onset   Atrial fibrillation Mother    Diabetes Father     Prior to Admission medications   Medication Sig Start Date End Date Taking? Authorizing Provider  acetaminophen (TYLENOL) 500 MG tablet Take 500 mg by mouth every 6 (six) hours as needed.    [provider]  ascorbic acid (VITAMIN C) 500 MG tablet Take by  mouth. Patient not taking: Reported on 06/24/2020    [provider]  atorvastatin (LIPITOR) 10 MG tablet Take 10 mg by mouth daily. Patient not taking: Reported on 06/30/2021    [provider]  atorvastatin (LIPITOR) 20 MG tablet Take 20 mg by mouth daily.    [provider]  CALCIUM-MAGNESUIUM-ZINC 333-133-8.3 MG TABS Take by mouth.    [provider]  diclofenac Sodium (VOLTAREN) 1 % GEL Apply topically. 11/03/17   [provider]  docusate sodium (COLACE) 100 MG capsule Take 1 tablet once or twice daily as needed for constipation while taking narcotic pain medicine 11/04/20   Hinda Kehr, MD  dutasteride (AVODART) 0.5 MG capsule Take by mouth daily. 12/18/18   [provider]  empagliflozin (JARDIANCE) 10 MG TABS tablet Take 25 mg by mouth daily.    [provider]  eszopiclone (LUNESTA) 2 MG TABS tablet Take by mouth as needed. Patient not taking: Reported on 12/31/2020 04/26/19   [provider]  furosemide (LASIX) 20 MG tablet Take by mouth. 07/02/20 07/02/21  [provider]  lisinopril (ZESTRIL) 10 MG tablet Take 10 mg by mouth daily.    [provider]  methocarbamol (ROBAXIN) 750 MG tablet Take by mouth. 04/08/20   [provider]  Multiple Vitamin (MULTI-VITAMIN) tablet Take 1 tablet by mouth daily.    [provider]  omeprazole (PRILOSEC) 40 MG capsule Take 40 mg by mouth daily.    [provider]  ondansetron (ZOFRAN ODT) 4 MG disintegrating tablet Take 1 tablet (4 mg total) by mouth every 8 (eight) hours as needed for nausea or vomiting. 09/10/20   Vladimir Crofts, MD  ondansetron (ZOFRAN) 8 MG tablet Take by mouth. 04/11/19   [provider]  oxyCODONE (ROXICODONE) 5 MG immediate release tablet Take 1 tablet (5 mg total) by mouth every 8 (eight) hours as needed. 10/22/21 10/22/22  Harvest Dark, MD  potassium chloride (KLOR-CON) 10 MEQ tablet Take by mouth. 07/02/20  07/02/21  [provider]  pregabalin (LYRICA) 75 MG capsule Take 75 mg by mouth. Takes '150mg'$  in am and '75mg'$  at night 01/18/19   [provider]  propranolol (INDERAL) 20 MG tablet Take 20 mg by mouth 2 (two) times daily.    [provider]  Semaglutide,0.25 or 0.'5MG'$ /DOS, (OZEMPIC, 0.25 OR 0.5 MG/DOSE,) 2 MG/1.5ML SOPN Inject into the skin. Takes every 7 days 05/22/19   [provider]  sucralfate (CARAFATE) 1 g tablet Take by mouth 2 (two) times daily. Patient not taking: Reported on 09/26/2020 01/11/20 02/18/21  [provider]  XIFAXAN 550 MG TABS tablet Take 550 mg by mouth 3 (three) times daily. Patient not taking: Reported on 12/31/2020 11/03/20   [provider]  gabapentin (NEURONTIN) 300 MG capsule Take 300 mg by mouth at bedtime.  08/08/19  [provider]    Physical Exam: Vitals:   01/28/22 1303 01/28/22 1330 01/28/22 1400 01/28/22  1418  BP:  128/81 127/64   Pulse:  (!) 115 (!) 110   Resp:  11 (!) 24 (!) 22  Temp: 98 F (36.7 C)     TempSrc:      SpO2:  96% 97%   Weight:      Height:       Physical Exam Vitals and nursing note reviewed.  Constitutional:      Comments: Chronically ill-appearing  HENT:     Head: Normocephalic and atraumatic.     Nose: Nose normal.     Mouth/Throat:     Mouth: Mucous membranes are moist.  Eyes:     Comments: Pale conjunctiva  Cardiovascular:     Rate and Rhythm: Tachycardia present. Rhythm irregular.  Pulmonary:     Effort: Pulmonary effort is normal.     Breath sounds: Normal breath sounds.  Abdominal:     General: Abdomen is flat. Bowel sounds are normal.     Palpations: Abdomen is soft.     Comments: Ecchymosis in the periumbilical area  Musculoskeletal:     Cervical back: Normal range of motion and neck supple.     Right lower leg: Edema present.     Left lower leg: Edema present.  Skin:    General: Skin is warm and dry.  Neurological:     General: No focal deficit  present.     Mental Status: He is alert and oriented to person, place, and time.  Psychiatric:        Mood and Affect: Mood normal.        Behavior: Behavior normal.     Data Reviewed: Relevant notes from primary care and specialist visits, past discharge summaries as available in EHR, including Care Everywhere. Prior diagnostic testing as pertinent to current admission diagnoses Updated medications and problem lists for reconciliation ED course, including vitals, labs, imaging, treatment and response to treatment Triage notes, nursing and pharmacy notes and ED provider's notes Notable results as noted in HPI Labs reviewed.  BNP 43.6, total protein 6.6, albumin 4.0, AST 50, ALT 42, alkaline phosphatase 73, total bilirubin 3.7, magnesium 1.9, TSH less than 0.10, troponin 7, sodium 138, potassium 3.9, chloride 105, bicarb 22, glucose 251, BUN 15, creatinine 0.57, calcium 9.1, white count 4.5, hemoglobin 17, hematocrit 49.8, platelet count 54 There are no new results to review at this time.  Assessment and Plan: * Atrial fibrillation with rapid ventricular response (HCC) Patient presents to the ER for evaluation of lightheadedness and palpitations twelve-lead EKG shows new onset atrial fibrillation Patient is currently on a Cardizem drip, titrate for heart rate less than 100 bpm He has a CHA2DS2-VASc score of 2 and ideally requires long-term anticoagulation as primary prophylaxis for an acute stroke but has significant thrombocytopenia which increases his risk for bleeding and as such we will defer anticoagulation at this time. Patient has a very low TSH concerning for hyperthyroidism.  Follow-up results of free thyroxine level Obtain 2D echocardiogram to assess LVEF and rule out valvular pathology Consult cardiology  Thrombocytopenia ([place]) Secondary to liver cirrhosis with portal hypertension No evidence of bleeding at this time Monitor closely during this hospitalization  Diabetes  mellitus with hyperglycemia ([place]) Patient has diabetes mellitus with hyperglycemia Hold Ozempic and Jardiance during this hospitalization Maintain consistent carbohydrate diet Glycemic control with sliding scale insulin  Cirrhosis of liver without ascites ([place]) Patient has a history of liver cirrhosis with evidence of portal hypertension Continue Carvedilol and furosemide  GERD (gastroesophageal reflux disease)  Continue PPI  Essential hypertension Continue lisinopril      Advance Care Planning:   Code Status: Full Code   Consults: Cardiology  Family Communication: Greater than 50% of time was spent discussing patient's condition and plan of care with him at the bedside.  All questions and concerns have been addressed.  He verbalizes understanding and agrees to the plan.  CODE STATUS was discussed and he is a full code  Severity of Illness: The appropriate patient status for this patient is INPATIENT. Inpatient status is judged to be reasonable and necessary in order to provide the required intensity of service to ensure the patient's safety. The patient's presenting symptoms, physical exam findings, and initial radiographic and laboratory data in the context of their chronic comorbidities is felt to place them at high risk for further clinical deterioration. Furthermore, it is not anticipated that the patient will be medically stable for discharge from the hospital within 2 midnights of admission.   * I certify that at the point of admission it is my clinical judgment that the patient will require inpatient hospital care spanning beyond 2 midnights from the point of admission due to high intensity of service, high risk for further deterioration and high frequency of surveillance required.*  Author: Collier Bullock, MD 01/28/2022 3:04 PM  For on call review [email or web address].

## 2022-01-28 NOTE — Consult Note (Signed)
CARDIOLOGY CONSULT NOTE               Patient ID: Juan Garza MRN: [id number] DOB/AGE: 02-11-1960 61 y.o.  Admit date: 01/28/2022 Referring Physician Dr. Francine Graven Primary Physician  Primary Cardiologist  Reason for Consultation atrial fibrillation  HPI: Patient is a 61 year old white male diabetes GERD liver cirrhosis thrombocytopenia hypertension came in via private vehicle not feeling well.  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  He checked his oximetry heart rate was 140.  Patient had intermittent chest discomfort rating to the jaw palpitations tachycardia came to the emergency room was found to be in rapid atrial fibrillation and then was advised to be admitted.  Review of systems complete and found to be negative unless listed above     Past Medical History:  Diagnosis Date   Arthritis    Chronic back pain    Cirrhosis ([place])    Colon polyps    Diabetes mellitus without complication (HCC)    GERD (gastroesophageal reflux disease)    GIB (gastrointestinal bleeding)    r/t NSAID use   Gout    Headache    Hyperlipidemia    Hypertension    Right-sided Bell's palsy    Sleep apnea     Past Surgical History:  Procedure Laterality Date   ABDOMINAL SURGERY     APPENDECTOMY     BACK SURGERY     posteriior lumbar spine fusion   CARDIAC CATHETERIZATION     colectomy partial w/anastoamosis     COLON SURGERY     COLONOSCOPY WITH PROPOFOL N/A 01/22/2020   Procedure: COLONOSCOPY WITH PROPOFOL;  Surgeon: Lesly Rubenstein, MD;  Location: [facility] ENDOSCOPY;  Service: Endoscopy;  Laterality: N/A;   ESOPHAGOGASTRODUODENOSCOPY N/A 01/29/2021   Procedure: ESOPHAGOGASTRODUODENOSCOPY (EGD);  Surgeon: Annamaria Helling, DO;  Location[facility] ENDOSCOPY;  Service: Gastroenterology;  Laterality: N/A;  DM   ESOPHAGOGASTRODUODENOSCOPY (EGD) WITH PROPOFOL N/A 01/22/2020   Procedure: ESOPHAGOGASTRODUODENOSCOPY (EGD) WITH PROPOFOL;  Surgeon:  Lesly Rubenstein, MD;  Location: [facility] ENDOSCOPY;  Service: Endoscopy;  Laterality: N/A;   HERNIA REPAIR     JOINT REPLACEMENT     KNEE SURGERY Left    NASAL SINUS SURGERY      (Not in a hospital admission)  Social History   Socioeconomic History   Marital status: Married    Spouse name: Not on file   Number of children: Not on file   Years of education: Not on file   Highest education level: Not on file  Occupational History   Not on file  Tobacco Use   Smoking status: Former   Smokeless tobacco: Never  Vaping Use   Vaping Use: Never used  Substance and Sexual Activity   Alcohol use: Never    Comment: none last 24hrs   Drug use: Never   Sexual activity: Not on file  Other Topics Concern   Not on file  Social History Narrative   Not on file   Social Determinants of Health   Financial Resource Strain: Not on file  Food Insecurity: Not on file  Transportation Needs: Not on file  Physical Activity: Not on file  Stress: Not on file  Social Connections: Not on file  Intimate Partner Violence: Not on file    Family History  Problem Relation Age of Onset   Atrial fibrillation Mother    Diabetes Father       Review of systems complete and found to be negative unless listed above  PHYSICAL EXAM  General: Well developed, well nourished, in no acute distress HEENT:  Normocephalic and atramatic Neck:  No JVD.  Lungs: Clear bilaterally to auscultation and percussion. Heart: Irregularly irregular. Normal S1 and S2 without gallops or murmurs.  Abdomen: Bowel sounds are positive, abdomen soft and non-tender  Msk:  Back normal, normal gait. Normal strength and tone for age. Extremities: No clubbing, cyanosis or edema.   Neuro: Alert and oriented X 3. Psych:  Good affect, responds appropriately  Labs:   Lab Results  Component Value Date   WBC 4.5 01/28/2022   HGB 17.0 01/28/2022   HCT 49.8 01/28/2022   MCV 87.1 01/28/2022   PLT 54 (L) 01/28/2022     Recent Labs  Lab 01/28/22 0850 01/28/22 0851  NA  --  138  K  --  3.9  CL  --  105  CO2  --  22  BUN  --  15  CREATININE  --  0.57*  CALCIUM  --  9.1  PROT 6.6  --   BILITOT 3.7*  --   ALKPHOS 73  --   ALT 42  --   AST 50*  --   GLUCOSE  --  251*   No results found for: "CKTOTAL", "CKMB", "CKMBINDEX", "TROPONINI" No results found for: "CHOL" No results found for: "HDL" No results found for: "LDLCALC" No results found for: "TRIG" No results found for: "CHOLHDL" No results found for: "LDLDIRECT"    Radiology: [facility] Chest Port 1 View  Result Date: 01/28/2022 CLINICAL DATA:  Lightheadedness EXAM: PORTABLE CHEST 1 VIEW COMPARISON:  None Available. FINDINGS: The heart size and mediastinal contours are within normal limits. Pulmonary vascular congestion. No focal consolidation. No pneumothorax or pleural effusion. IMPRESSION: Pulmonary vascular congestion.  No focal consolidation. Electronically Signed   By: Sammie Bench M.D.   On: 01/28/2022 09:18    EKG: Atrial fibrillation rate of 140  ASSESSMENT AND PLAN:  Atrial fibrillation Diabetes GERD History of liver cirrhosis Thrombocytopenia Hypertension Hyperlipidemia Obstructive sleep apnea . Plan Atrial fibrillation rapid ventricular response continue Cardizem will avoid amiodarone because of liver disease and avoid anticoagulation because of thrombocytopenia Thrombocytopenia have patient follow-up with oncology hematology Diabetes type 2 uncomplicated has been on Ozempic and Jardiance Continue management of cirrhosis follow-up with GI GERD continue PPI Agree with lisinopril therapy for hypertension   Signed: Yolonda Kida MD,  01/28/2022, 12:09 PM

## 2022-01-28 NOTE — ED Notes (Signed)
Pt ambulatory to restroom

## 2022-01-28 NOTE — ED Provider Notes (Signed)
[facility] Provider Note    Event Date/Time   First MD Initiated Contact with Patient 01/28/22 [phone]     (approximate)   History   Chief Complaint Atrial Fibrillation   HPI  Juan Garza is a 61 y.o. male with past medical history of hypertension, hyperlipidemia, diabetes, cirrhosis, and GERD who presents to the ED complaining of palpitations.  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  He went home to check his heart rate on a home pulse ox, found it to vary between 100-140.  He states he thinks he might be in atrial fibrillation, but denies any history of this.  He denies any significant cardiac history, does not take a blood thinner.     Physical Exam   Triage Vital Signs: ED Triage Vitals [01/28/22 0842]  Enc Vitals Group     BP (!) 144/97     Pulse Rate (!) 150     Resp 18     Temp 97.7 F (36.5 C)     Temp Source Oral     SpO2 99 %     Weight 270 lb (122.5 kg)     Height '6\' 2"'$  (1.88 m)     Head Circumference      Peak Flow      Pain Score 0     Pain Loc      Pain Edu?      Excl. in [place]?     Most recent vital signs: Vitals:   01/28/22 1030 01/28/22 1100  BP: (!) 134/106 120/77  Pulse: 96 (!) 120  Resp: 19 20  Temp:    SpO2: 98% 98%    Constitutional: Alert and oriented. Eyes: Conjunctivae are normal. Head: Atraumatic. Nose: No congestion/rhinnorhea. Mouth/Throat: Mucous membranes are moist.  Cardiovascular: Tachycardic, irregularly irregular rhythm. Grossly normal heart sounds.  2+ radial pulses bilaterally. Respiratory: Normal respiratory effort.  No retractions. Lungs CTAB. Gastrointestinal: Soft and nontender. No distention. Musculoskeletal: No lower extremity tenderness nor edema.  Neurologic:   Normal speech and language. No gross focal neurologic deficits are appreciated.    ED Results / Procedures / Treatments   Labs (all labs ordered are listed, but only abnormal results are displayed) Labs Reviewed  BASIC METABOLIC PANEL - Abnormal; Notable for the following components:      Result Value   Glucose, Bld 251 (*)    Creatinine, Ser 0.57 (*)    All other components within normal limits  CBC - Abnormal; Notable for the following components:   Platelets 54 (*)    All other components within normal limits  HEPATIC FUNCTION PANEL - Abnormal; Notable for the following components:   AST 50 (*)    Total Bilirubin 3.7 (*)    Bilirubin, Direct 0.6 (*)    Indirect Bilirubin 3.1 (*)    All other components within normal limits  TSH - Abnormal; Notable for the following components:   TSH <0.010 (*)    All other components within normal limits  MAGNESIUM  BRAIN NATRIURETIC PEPTIDE  T4, FREE  T3, FREE  TROPONIN I (HIGH SENSITIVITY)     EKG  ED ECG REPORT I, Blake Divine, the attending physician, personally viewed and interpreted this ECG.  Date: 01/28/2022  EKG Time: 8:38  Rate: 144  Rhythm: atrial fibrillation  Axis: Normal  Intervals:none  ST&T Change: None  RADIOLOGY Chest x-ray reviewed and interpreted by me with mild pulmonary edema, no focal infiltrate or effusion noted.  PROCEDURES:  Critical Care performed: Yes, see critical care procedure note(s)  .Critical Care  Performed by: Blake Divine, MD Authorized by: Blake Divine, MD   Critical care provider statement:    Critical care time (minutes):  30   Critical care time was exclusive of:  Separately billable procedures and treating other patients and teaching time   Critical care was necessary to treat or prevent imminent or life-threatening deterioration of the following conditions:  Cardiac failure   Critical care was time spent personally by me on the following activities:  Development of  treatment plan with patient or surrogate, discussions with consultants, evaluation of patient's response to treatment, examination of patient, ordering and review of laboratory studies, ordering and review of radiographic studies, ordering and performing treatments and interventions, pulse oximetry, re-evaluation of patient's condition and review of old charts   I assumed direction of critical care for this patient from another provider in my specialty: no     Care discussed with: admitting provider      [facility]: Medications  diltiazem (CARDIZEM) 125 mg in dextrose 5% 125 mL (1 mg/mL) infusion (5 mg/hr Intravenous New Bag/Given 01/28/22 0909)  diltiazem (CARDIZEM) injection 10 mg (10 mg Intravenous Given 01/28/22 0906)  ondansetron (ZOFRAN) injection 4 mg (4 mg Intravenous Given 01/28/22 0945)  diltiazem (CARDIZEM) injection 10 mg (10 mg Intravenous Given 01/28/22 0948)  furosemide (LASIX) injection 40 mg (40 mg Intravenous Given 01/28/22 1126)     IMPRESSION / MDM / [place] / ED COURSE  I reviewed the triage vital signs and the nursing notes.                              61 y.o. male with past medical history of hypertension, hyperlipidemia, diabetes, cirrhosis, and GERD who presents to the ED complaining of palpitations starting around 5:00 this morning.  Patient's presentation is most consistent with acute presentation with potential threat to life or bodily function.  Differential diagnosis includes, but is not limited to, arrhythmia, ACS, PE, pneumonia, CHF, electrolyte abnormality, hyperthyroidism.  Patient nontoxic-appearing and in no acute distress, vital signs remarkable for tachycardia with a regular rhythm but otherwise reassuring with stable blood pressure.  EKG shows atrial fibrillation with RVR, no ischemic changes noted.  Patient minimally symptomatic at this time, does not have a history of A-fib and we will attempt to control rate with IV  diltiazem.  Plan to further assess with labs and chest x-ray, anticipate admission for further workup.  Heart rate gradually improving following IV diltiazem bolus and drip, now in the 110s.  Labs show no significant anemia, leukocytosis, lecture abnormality, or AKI.  Patient does have undetectably low TSH and hypothyroidism may be contributing to his atrial fibrillation, although no symptoms concerning for thyroid storm at this time.  We will add on free T3 and T4 levels, case discussed with hospitalist for admission.      FINAL CLINICAL IMPRESSION(S) / ED DIAGNOSES   Final diagnoses:  Atrial fibrillation with RVR ([place])     Rx / DC Orders   ED Discharge Orders     None        Note:  This document  was prepared using Systems analyst and may include unintentional dictation errors.   Blake Divine, MD 01/28/22 1140

## 2022-01-28 NOTE — ED Notes (Signed)
See triage note, pt reports feeling like in a fib since 0500 this am. NAD noted

## 2022-01-28 NOTE — Assessment & Plan Note (Addendum)
Once heart rate under control, able to be weaned off of Cardizem drip.  On discharge, Coreg increased to 12.5 p.o. twice daily

## 2022-01-28 NOTE — Assessment & Plan Note (Addendum)
Appreciate cardiology assistance.  Heart rate controlled on Cardizem drip.  CHADS2 score of 2, but due to significant chronic thrombocytopenia, high risk of bleeding so at this time, coagulation deferred.  Workup revealed suspected hyperthyroidism.  See below.  2D echo avoid amiodarone because of liver disease.  Once heart rate under control, converted back to normal sinus rhythm.

## 2022-01-28 NOTE — Assessment & Plan Note (Addendum)
Patient has a history of liver cirrhosis with evidence of portal hypertension Continue Carvedilol and furosemide

## 2022-01-28 NOTE — ED Notes (Signed)
Pt eating dinner

## 2022-01-28 NOTE — Assessment & Plan Note (Addendum)
Secondary to liver cirrhosis with portal hypertension No evidence of bleeding at this time, not a candidate for anticoagulation because of this

## 2022-01-28 NOTE — Assessment & Plan Note (Signed)
Patient has diabetes mellitus with hyperglycemia Hold Ozempic and Jardiance during this hospitalization Maintain consistent carbohydrate diet Glycemic control with sliding scale insulin

## 2022-01-29 ENCOUNTER — Encounter: Payer: Self-pay | Admitting: Internal Medicine

## 2022-01-29 ENCOUNTER — Inpatient Hospital Stay
Admit: 2022-01-29 | Discharge: 2022-01-29 | Disposition: A | Discharge status: Home / Self Care | Payer: BC Managed Care – PPO | Attending: Internal Medicine | Admitting: Internal Medicine

## 2022-01-29 DIAGNOSIS — I4891 Unspecified atrial fibrillation: Secondary | ICD-10-CM | POA: Diagnosis not present

## 2022-01-29 LAB — BASIC METABOLIC PANEL
Anion gap: 9 (ref 5–15)
BUN: 16 mg/dL (ref 6–20)
CO2: 21 mmol/L — ABNORMAL LOW (ref 22–32)
Calcium: 8.3 mg/dL — ABNORMAL LOW (ref 8.9–10.3)
Chloride: 101 mmol/L (ref 98–111)
Creatinine, Ser: 0.58 mg/dL — ABNORMAL LOW (ref 0.61–1.24)
GFR, Estimated: >60 mL/min (ref >60)
Glucose, Bld: 299 mg/dL — ABNORMAL HIGH (ref 70–99)
Potassium: 3.3 mmol/L — ABNORMAL LOW (ref 3.5–5.1)
Sodium: 131 mmol/L — ABNORMAL LOW (ref 135–145)

## 2022-01-29 LAB — CBC
HCT: 44.9 % (ref 39.0–52.0)
Hemoglobin: 15.3 g/dL (ref 13.0–17.0)
MCH: 29.4 pg (ref 26.0–34.0)
MCHC: 34.1 g/dL (ref 30.0–36.0)
MCV: 86.2 fL (ref 80.0–100.0)
Platelets: 44 10*3/uL — ABNORMAL LOW (ref 150–400)
RBC: 5.21 MIL/uL (ref 4.22–5.81)
RDW: 14.2 % (ref 11.5–15.5)
WBC: 3.9 10*3/uL — ABNORMAL LOW (ref 4.0–10.5)
nRBC: 0 % (ref 0.0–0.2)

## 2022-01-29 LAB — CBG MONITORING, ED
Glucose-Capillary: 181 mg/dL — ABNORMAL HIGH (ref 70–99)
Glucose-Capillary: 216 mg/dL — ABNORMAL HIGH (ref 70–99)
Glucose-Capillary: 138 mg/dL — ABNORMAL HIGH (ref 70–99)
Glucose-Capillary: 160 mg/dL — ABNORMAL HIGH (ref 70–99)

## 2022-01-29 LAB — ECHOCARDIOGRAM COMPLETE
AR max vel: 3.24 cm2
AV Area VTI: 3.41 cm2
AV Area mean vel: 3.16 cm2
AV Mean grad: 5 mmHg
AV Peak grad: 9 mmHg
Ao pk vel: 1.5 m/s
Area-P 1/2: 3.53 cm2
Height: 74 in
S' Lateral: 2.4 cm
Weight: 4320 oz

## 2022-01-29 MED ORDER — ONDANSETRON HCL 4 MG/2ML IJ SOLN
4.0000 mg | Freq: Four times a day (QID) | INTRAMUSCULAR | Status: DC | PRN
  Administered 2022-01-29 – 2022-01-31 (×3): 4 mg via INTRAVENOUS
  Filled 2022-01-29 (×3): qty 2

## 2022-01-29 MED ORDER — POTASSIUM CHLORIDE 20 MEQ PO PACK
40.0000 meq | PACK | Freq: Once | ORAL | Status: AC
  Administered 2022-01-29: 40 meq via ORAL
  Filled 2022-01-29: qty 2

## 2022-01-29 NOTE — Progress Notes (Signed)
PROGRESS NOTE    Juan Garza  OFB:[id number] DOB: Feb 12, 1960 DOA: 01/28/2022 PCP: Pcp, No    Brief Narrative:  This 61 years old male with PMH significant for diabetes, GERD, history of liver cirrhosis, hypertension presented to the ED for the evaluation of not feeling well.  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 he developed palpitations and lightheadedness.  He went home and checked his pulse ox and noted some fluctuation in his oxygen level. His heart rate ranged between 100 and 140 associated with intermittent chest pain radiating towards his jaw. EKG in the ER shows A-fib with RVR. Patient is started on Cardizem gtt. Cardiology is consulted.  Heart rate is now well-controlled.  Assessment & Plan:   Principal Problem:   Atrial fibrillation with rapid ventricular response (HCC) Active Problems:   Thrombocytopenia ([place])   Diabetes mellitus with hyperglycemia ([place])   Essential hypertension   GERD (gastroesophageal reflux disease)   Cirrhosis of liver without ascites ([place])  A. Fib with RVR: Patient presented in the ED for the evaluation of lightheadedness and palpitations. EKG showed new onset A-fib. Heart rate is now well-controlled on Cardizem gtt. Patient has a CHA2DS2-VASc score of 2 and requires long-term anticoagulation as primary prophylaxis for acute stroke but due to significant thrombocytopenia which increases his risk of bleeding, will defer anticoagulation at this time. Patient has a very low TSH concerning for hyperthyroidism. Obtain thyroid profile. Obtain 2D echocardiogram to rule out valvular pathology. Cardiology consulted awaiting recommendation.   Thrombocytopenia: Secondary to liver cirrhosis with portal hypertension. No evidence of bleeding at this time. Monitor closely during this hospitalization   Diabetes mellitus with hyperglycemia: Hold Ozempic and Jardiance during this hospitalization Maintain consistent carbohydrate  diet Glycemic control with sliding scale insulin   Liver cirrhosis without ascites: Patient has a history of liver cirrhosis with evidence of portal hypertension. Continue Carvedilol and furosemide.   GERD: Continue Pantoprazole 40 mg daily   Essential hypertension Continue Coreg 6.25 mg every 12 hours.   DVT prophylaxis:  SCDs Code Status: Full code Family Communication:  No family at bed side. Disposition Plan:   Status is: Inpatient Remains inpatient appropriate because: Admitted for chest pain and A-fib with RVR.    Consultants:  Cardiology  Procedures: None Antimicrobials: None  Subjective: Patient was seen and examined at bedside.  Overnight events noted. Patient reports doing better, denies any chest pain , states  palpitations improved.  Objective: Vitals:   01/29/22 1000 01/29/22 1015 01/29/22 1100 01/29/22 1200  BP: 129/75  (!) 108/47 120/66  Pulse:  70 78 84  Resp: 16  (!) 32 18  Temp:    98.2 F (36.8 C)  TempSrc:      SpO2:  95% 96% 98%  Weight:      Height:       No intake or output data in the 24 hours ending 01/29/22 1537 Filed Weights   01/28/22 0842  Weight: 122.5 kg    Examination:  General exam: Appears comfortable, not in any acute distress, deconditioned Respiratory system: CTA bilaterally, respiratory for normal, RR 15. Cardiovascular system: S1 & S2 heard, Irregular rhythm, no murmur. Gastrointestinal system: Abdomen is soft, mildly distended, nontender, BS+ Central nervous system: Alert and oriented x 3. No focal neurological deficits. Extremities: No edema, no cyanosis, no clubbing Skin: No rashes, lesions or ulcers Psychiatry: Judgement and insight appear normal. Mood & affect appropriate.     Data Reviewed: I have personally reviewed following  labs and imaging studies  CBC: Recent Labs  Lab 01/28/22 0851 01/29/22 0503  WBC 4.5 3.9*  HGB 17.0 15.3  HCT 49.8 44.9  MCV 87.1 86.2  PLT 54* 44*   Basic Metabolic  Panel: Recent Labs  Lab 01/28/22 0850 01/28/22 0851 01/29/22 0503  NA  --  138 131*  K  --  3.9 3.3*  CL  --  105 101  CO2  --  22 21*  GLUCOSE  --  251* 299*  BUN  --  15 16  CREATININE  --  0.57* 0.58*  CALCIUM  --  9.1 8.3*  MG 1.9  --   --    GFR: Estimated Creatinine Clearance: 136.5 mL/min (A) (by C-G formula based on SCr of 0.58 mg/dL (L)). Liver Function Tests: Recent Labs  Lab 01/28/22 0850  AST 50*  ALT 42  ALKPHOS 73  BILITOT 3.7*  PROT 6.6  ALBUMIN 4.0   No results for input(s): "LIPASE", "AMYLASE" in the last 168 hours. No results for input(s): "AMMONIA" in the last 168 hours. Coagulation Profile: No results for input(s): "INR", "PROTIME" in the last 168 hours. Cardiac Enzymes: No results for input(s): "CKTOTAL", "CKMB", "CKMBINDEX", "TROPONINI" in the last 168 hours. BNP (last 3 results) No results for input(s): "PROBNP" in the last 8760 hours. HbA1C: No results for input(s): "HGBA1C" in the last 72 hours. CBG: Recent Labs  Lab 01/28/22 1655 01/29/22 0755 01/29/22 1233  GLUCAP 169* 181* 216*   Lipid Profile: No results for input(s): "CHOL", "HDL", "LDLCALC", "TRIG", "CHOLHDL", "LDLDIRECT" in the last 72 hours. Thyroid Function Tests: Recent Labs    01/28/22 0850  TSH <0.010*   Anemia Panel: No results for input(s): "VITAMINB12", "FOLATE", "FERRITIN", "TIBC", "IRON", "RETICCTPCT" in the last 72 hours. Sepsis Labs: No results for input(s): "PROCALCITON", "LATICACIDVEN" in the last 168 hours.  No results found for this or any previous visit (from the past 240 hour(s)).   Radiology Studies: ECHOCARDIOGRAM COMPLETE  Result Date: 01/29/2022    ECHOCARDIOGRAM REPORT   Patient Name:   RONZELL LABAN Date of Exam: 01/29/2022 Medical Rec #:  [id number]    Height:       74.0 in Accession #:    [id number]   Weight:       270.0 lb Date of Birth:  22-Sep-1960    BSA:          2.469 m Patient Age:    70 years     BP:           109/68 mmHg Patient Gender: M             HR:           77 bpm. Exam Location:  [facility] Procedure: 2D Echo, Color Doppler and Cardiac Doppler Indications:     I48.91 Atrial fibrillation  History:         Patient has no prior history of Echocardiogram examinations.                  Risk Factors:Hypertension, Diabetes, Dyslipidemia and Sleep                  Apnea.  Sonographer:     Charmayne Sheer Referring Phys:  [place] Diagnosing Phys: Neoma Laming  Sonographer Comments: Suboptimal subcostal window. IMPRESSIONS  1. Left ventricular ejection fraction, by estimation, is 60 to 65%. The left ventricle has normal function. The left ventricle has no regional wall motion abnormalities. Left ventricular diastolic parameters  were normal.  2. Right ventricular systolic function is normal. The right ventricular size is normal.  3. Left atrial size was mildly dilated.  4. Right atrial size was mildly dilated.  5. The mitral valve is normal in structure. Trivial mitral valve regurgitation. No evidence of mitral stenosis.  6. The aortic valve is normal in structure. Aortic valve regurgitation is not visualized. No aortic stenosis is present.  7. The inferior vena cava is normal in size with greater than 50% respiratory variability, suggesting right atrial pressure of 3 mmHg. FINDINGS  Left Ventricle: Left ventricular ejection fraction, by estimation, is 60 to 65%. The left ventricle has normal function. The left ventricle has no regional wall motion abnormalities. The left ventricular internal cavity size was normal in size. There is  no left ventricular hypertrophy. Left ventricular diastolic parameters were normal. Right Ventricle: The right ventricular size is normal. No increase in right ventricular wall thickness. Right ventricular systolic function is normal. Left Atrium: Left atrial size was mildly dilated. Right Atrium: Right atrial size was mildly dilated. Pericardium: There is no evidence of pericardial effusion. Mitral Valve: The mitral  valve is normal in structure. Trivial mitral valve regurgitation. No evidence of mitral valve stenosis. Tricuspid Valve: The tricuspid valve is normal in structure. Tricuspid valve regurgitation is trivial. No evidence of tricuspid stenosis. Aortic Valve: The aortic valve is normal in structure. Aortic valve regurgitation is not visualized. No aortic stenosis is present. Aortic valve mean gradient measures 5.0 mmHg. Aortic valve peak gradient measures 9.0 mmHg. Aortic valve area, by VTI measures 3.41 cm. Pulmonic Valve: The pulmonic valve was normal in structure. Pulmonic valve regurgitation is not visualized. No evidence of pulmonic stenosis. Aorta: The aortic root is normal in size and structure. Venous: The inferior vena cava is normal in size with greater than 50% respiratory variability, suggesting right atrial pressure of 3 mmHg. IAS/Shunts: No atrial level shunt detected by color flow Doppler.  LEFT VENTRICLE PLAX 2D LVIDd:         3.70 cm   Diastology LVIDs:         2.40 cm   LV e' medial:    10.00 cm/s LV PW:         1.30 cm   LV E/e' medial:  8.1 LV IVS:        0.80 cm   LV e' lateral:   11.60 cm/s LVOT diam:     2.20 cm   LV E/e' lateral: 7.0 LV SV:         100 LV SV Index:   40 LVOT Area:     3.80 cm  RIGHT VENTRICLE RV Basal diam:  3.40 cm RV S prime:     17.50 cm/s LEFT ATRIUM             Index        RIGHT ATRIUM           Index LA diam:        3.30 cm 1.34 cm/m   RA Area:     17.00 cm LA Vol (A2C):   43.8 ml 17.74 ml/m  RA Volume:   43.80 ml  17.74 ml/m LA Vol (A4C):   31.4 ml 12.72 ml/m LA Biplane Vol: 37.3 ml 15.11 ml/m  AORTIC VALVE                     PULMONIC VALVE AV Area (Vmax):    3.24 cm  PV Vmax:       1.25 m/s AV Area (Vmean):   3.16 cm      PV Vmean:      89.000 cm/s AV Area (VTI):     3.41 cm      PV VTI:        0.218 m AV Vmax:           150.00 cm/s   PV Peak grad:  6.2 mmHg AV Vmean:          108.000 cm/s  PV Mean grad:  4.0 mmHg AV VTI:            0.292 m AV Peak Grad:       9.0 mmHg AV Mean Grad:      5.0 mmHg LVOT Vmax:         128.00 cm/s LVOT Vmean:        89.700 cm/s LVOT VTI:          0.262 m LVOT/AV VTI ratio: 0.90  AORTA Ao Root diam: 3.80 cm MITRAL VALVE MV Area (PHT): 3.53 cm    SHUNTS MV Decel Time: 215 msec    Systemic VTI:  0.26 m MV E velocity: 81.20 cm/s  Systemic Diam: 2.20 cm MV A velocity: 85.40 cm/s MV E/A ratio:  0.95 Shaukat Khan Electronically signed by Neoma Laming Signature Date/Time: 01/29/2022/10:34:09 AM    Final    DG Chest Port 1 View  Result Date: 01/28/2022 CLINICAL DATA:  Lightheadedness EXAM: PORTABLE CHEST 1 VIEW COMPARISON:  None Available. FINDINGS: The heart size and mediastinal contours are within normal limits. Pulmonary vascular congestion. No focal consolidation. No pneumothorax or pleural effusion. IMPRESSION: Pulmonary vascular congestion.  No focal consolidation. Electronically Signed   By: Sammie Bench M.D.   On: 01/28/2022 09:18    Scheduled Meds:  atorvastatin  20 mg Oral QPM   carvedilol  6.25 mg Oral BID WC   dutasteride  0.5 mg Oral Daily   furosemide  20 mg Oral Daily   insulin aspart  0-15 Units Subcutaneous TID WC   lisinopril  10 mg Oral Daily   methocarbamol  750 mg Oral TID   multivitamin with minerals  1 tablet Oral Daily   pantoprazole  40 mg Oral Daily   potassium chloride  10 mEq Oral Daily   pregabalin  150 mg Oral QHS   pregabalin  75 mg Oral Daily   Continuous Infusions:  diltiazem (CARDIZEM) infusion Stopped (01/29/22 0508)     LOS: 1 day    Time spent: 50 mins    Elyan Vanwieren, MD [facility]   If 7PM-7AM, please contact night-coverage

## 2022-01-29 NOTE — ED Notes (Signed)
Pt requesting zofran, MD Dwyane Dee notified.

## 2022-01-29 NOTE — Progress Notes (Signed)
*  PRELIMINARY RESULTS* Echocardiogram 2D Echocardiogram has been performed.  Juan Garza 01/29/2022, 9:56 AM

## 2022-01-29 NOTE — ED Notes (Signed)
ECG reflecting SR in the low to mid 60's. Cardizem infusion paused. NP Randol Kern notified as well as MD Agbata.

## 2022-01-29 NOTE — ED Notes (Signed)
Patient transported to assigned room upstairs with transport.

## 2022-01-29 NOTE — ED Notes (Addendum)
EKG completed and exported revealing SR 70's. Dwyane Dee, MD notified.

## 2022-01-29 NOTE — ED Notes (Signed)
Request made for transport to the floor ?

## 2022-01-29 NOTE — ED Notes (Signed)
Patient resting on Ed stretcher at this time. Patient states his lower back is hurting and requesting tylenol. Prn medication administered and tolerated well. Informed patient of upcoming transport to room assignment. No other questions or concerns voiced at this time.

## 2022-01-29 NOTE — ED Notes (Signed)
Assumed care from [place], [place]. Pt resting comfortably in bed at this time. Pt denies any current needs or questions. Call light with in reach.

## 2022-01-30 DIAGNOSIS — I4891 Unspecified atrial fibrillation: Secondary | ICD-10-CM | POA: Diagnosis not present

## 2022-01-30 LAB — CBC
HCT: 43 % (ref 39.0–52.0)
Hemoglobin: 14.9 g/dL (ref 13.0–17.0)
MCH: 29.8 pg (ref 26.0–34.0)
MCHC: 34.7 g/dL (ref 30.0–36.0)
MCV: 86 fL (ref 80.0–100.0)
Platelets: 39 10*3/uL — ABNORMAL LOW (ref 150–400)
RBC: 5 MIL/uL (ref 4.22–5.81)
RDW: 14.2 % (ref 11.5–15.5)
WBC: 2.1 10*3/uL — ABNORMAL LOW (ref 4.0–10.5)
nRBC: 0 % (ref 0.0–0.2)

## 2022-01-30 LAB — BASIC METABOLIC PANEL
Anion gap: 6 (ref 5–15)
BUN: 15 mg/dL (ref 6–20)
CO2: 25 mmol/L (ref 22–32)
Calcium: 9 mg/dL (ref 8.9–10.3)
Chloride: 107 mmol/L (ref 98–111)
Creatinine, Ser: 0.53 mg/dL — ABNORMAL LOW (ref 0.61–1.24)
GFR, Estimated: >60 mL/min (ref >60)
Glucose, Bld: 180 mg/dL — ABNORMAL HIGH (ref 70–99)
Potassium: 3.8 mmol/L (ref 3.5–5.1)
Sodium: 138 mmol/L (ref 135–145)

## 2022-01-30 LAB — MAGNESIUM: Magnesium: 1.8 mg/dL (ref 1.7–2.4)

## 2022-01-30 LAB — PHOSPHORUS: Phosphorus: 4.1 mg/dL (ref 2.5–4.6)

## 2022-01-30 LAB — T4, FREE: Free T4: 1.75 ng/dL — ABNORMAL HIGH (ref 0.61–1.12)

## 2022-01-30 LAB — GLUCOSE, CAPILLARY
Glucose-Capillary: 170 mg/dL — ABNORMAL HIGH (ref 70–99)
Glucose-Capillary: 215 mg/dL — ABNORMAL HIGH (ref 70–99)
Glucose-Capillary: 168 mg/dL — ABNORMAL HIGH (ref 70–99)
Glucose-Capillary: 175 mg/dL — ABNORMAL HIGH (ref 70–99)

## 2022-01-30 LAB — HIV ANTIBODY (ROUTINE TESTING W REFLEX): HIV Screen 4th Generation wRfx: NONREACTIVE

## 2022-01-30 LAB — TSH: TSH: <0.01 u[IU]/mL — ABNORMAL LOW (ref 0.350–4.500)

## 2022-01-30 MED ORDER — METHIMAZOLE 5 MG PO TABS
10.0000 mg | ORAL_TABLET | Freq: Every day | ORAL | Status: DC
  Administered 2022-01-31 – 2022-02-01 (×2): 10 mg via ORAL
  Filled 2022-01-30 (×3): qty 2

## 2022-01-30 NOTE — Progress Notes (Signed)
PROGRESS NOTE    Juan Garza  IRJ:[id number] DOB: 1960-10-22 DOA: 01/28/2022 PCP: Pcp, No    Brief Narrative:  This 61 years old male with PMH significant for diabetes, GERD, history of liver cirrhosis, hypertension presented to the ED for the evaluation of not feeling well.  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 he developed palpitations and lightheadedness.  He went home and checked his pulse ox and noted some fluctuation in his oxygen level. His heart rate ranged between 100 and 140 associated with intermittent chest pain radiating towards his jaw. EKG in the ER shows A-fib with RVR. Patient is started on Cardizem gtt. Cardiology is consulted.  Heart rate is now well-controlled.  Assessment & Plan:   Principal Problem:   Atrial fibrillation with rapid ventricular response (HCC) Active Problems:   Thrombocytopenia ([place])   Diabetes mellitus with hyperglycemia ([place])   Essential hypertension   GERD (gastroesophageal reflux disease)   Cirrhosis of liver without ascites ([place])  A. Fib with RVR: Patient presented in the ED for the evaluation of lightheadedness and palpitations. EKG showed new onset A-fib. Heart rate is now well-controlled on Cardizem gtt. Patient has a CHA2DS2-VASc score of 2 and requires long-term anticoagulation as primary prophylaxis for acute stroke but due to significant thrombocytopenia which increases his risk of bleeding, will defer anticoagulation at this time. Patient has a very low TSH concerning for hyperthyroidism.  Thyroid profile consistent with hyperthyroidism. 2D echo shows LVEF 60 to 65%, no regional wall motion abnormalities.  No valvular pathology. Cardiology consulted , recommended to continue Cardizem. Avoid amiodarone because of her liver disease and avoid anticoagulation because of thrombocytopenia.  Thrombocytopenia: Secondary to liver cirrhosis with portal hypertension. No evidence of bleeding at this time. Monitor closely  during this hospitalization   Diabetes mellitus with hyperglycemia: Hold Ozempic and Jardiance during this hospitalization. Maintain consistent carbohydrate diet Glycemic control with sliding scale insulin.   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 Continue Carvedilol and furosemide.   GERD: Continue Pantoprazole 40 mg daily   Essential hypertension: Continue Coreg 6.25 mg every 12 hours.  Hyperthyroidism: Continue methimazole 10 mg 3 times daily. Recheck thyroid profile in 4 to 6 weeks.:   DVT prophylaxis:  SCDs Code Status: Full code Family Communication:  No family at bed side. Disposition Plan:   Status is: Inpatient Remains inpatient appropriate because: Admitted for chest pain and A-fib with RVR.  He is found to have hypothyroidism on thyroid profile.  He is started on methimazole 10 mg 3 times daily.    Consultants:  Cardiology  Procedures: None Antimicrobials: None  Subjective: Patient was seen and examined at bedside.  Overnight events noted. Patient reports doing much better, denies any chest pain,  palpitations or shortness of breath.  Objective: Vitals:   01/29/22 2030 01/29/22 2055 01/30/22 0309 01/30/22 0845  BP: 115/67 120/67 117/61 121/68  Pulse: 87 83 79 78  Resp: 20 20 (!) 21 16  Temp:  98.3 F (36.8 C) 98 F (36.7 C) 97.7 F (36.5 C)  TempSrc:  Oral    SpO2: 94% 99% 93% 100%  Weight:  115.7 kg    Height:  6' (1.829 m)     No intake or output data in the 24 hours ending 01/30/22 1138 Filed Weights   01/28/22 0842 01/29/22 2055  Weight: 122.5 kg 115.7 kg    Examination:  General exam: Appears comfortable, not in  any acute distress.  Deconditioned. Respiratory system: CTA bilaterally, respiratory for normal, RR 15. Cardiovascular system: S1-S2 heard, Irregular rhythm, no murmur. Gastrointestinal system: Abdomen is soft, mildly distended, nontender, BS+ Central  nervous system: Alert and oriented x 3. No focal neurological deficits. Extremities: No edema, no cyanosis, no clubbing Skin: No rashes, lesions or ulcers Psychiatry: Judgement and insight appear normal. Mood & affect appropriate.     Data Reviewed: I have personally reviewed following labs and imaging studies  CBC: Recent Labs  Lab 01/28/22 0851 01/29/22 0503 01/30/22 0622  WBC 4.5 3.9* 2.1*  HGB 17.0 15.3 14.9  HCT 49.8 44.9 43.0  MCV 87.1 86.2 86.0  PLT 54* 44* 39*   Basic Metabolic Panel: Recent Labs  Lab 01/28/22 0850 01/28/22 0851 01/29/22 0503 01/30/22 0622  NA  --  138 131* 138  K  --  3.9 3.3* 3.8  CL  --  105 101 107  CO2  --  22 21* 25  GLUCOSE  --  251* 299* 180*  BUN  --  '15 16 15  '$ CREATININE  --  0.57* 0.58* 0.53*  CALCIUM  --  9.1 8.3* 9.0  MG 1.9  --   --  1.8  PHOS  --   --   --  4.1   GFR: Estimated Creatinine Clearance: 128.9 mL/min (A) (by C-G formula based on SCr of 0.53 mg/dL (L)). Liver Function Tests: Recent Labs  Lab 01/28/22 0850  AST 50*  ALT 42  ALKPHOS 73  BILITOT 3.7*  PROT 6.6  ALBUMIN 4.0   No results for input(s): "LIPASE", "AMYLASE" in the last 168 hours. No results for input(s): "AMMONIA" in the last 168 hours. Coagulation Profile: No results for input(s): "INR", "PROTIME" in the last 168 hours. Cardiac Enzymes: No results for input(s): "CKTOTAL", "CKMB", "CKMBINDEX", "TROPONINI" in the last 168 hours. BNP (last 3 results) No results for input(s): "PROBNP" in the last 8760 hours. HbA1C: No results for input(s): "HGBA1C" in the last 72 hours. CBG: Recent Labs  Lab 01/29/22 0755 01/29/22 1233 01/29/22 1640 01/29/22 2009 01/30/22 0846  GLUCAP 181* 216* 138* 160* 170*   Lipid Profile: No results for input(s): "CHOL", "HDL", "LDLCALC", "TRIG", "CHOLHDL", "LDLDIRECT" in the last 72 hours. Thyroid Function Tests: Recent Labs    01/30/22 0622  TSH <0.010*  FREET4 1.75*   Anemia Panel: No results for input(s):  "VITAMINB12", "FOLATE", "FERRITIN", "TIBC", "IRON", "RETICCTPCT" in the last 72 hours. Sepsis Labs: No results for input(s): "PROCALCITON", "LATICACIDVEN" in the last 168 hours.  No results found for this or any previous visit (from the past 240 hour(s)).   Radiology Studies: ECHOCARDIOGRAM COMPLETE  Result Date: 01/29/2022    ECHOCARDIOGRAM REPORT   Patient Name:   DAROLD MILEY Date of Exam: 01/29/2022 Medical Rec #:  [id number]    Height:       74.0 in Accession #:    [id number]   Weight:       270.0 lb Date of Birth:  06/03/1960    BSA:          2.469 m Patient Age:    77 years     BP:           109/68 mmHg Patient Gender: M            HR:           77 bpm. Exam Location:  [facility] Procedure: 2D Echo, Color Doppler and Cardiac Doppler Indications:     I48.91 Atrial  fibrillation  History:         Patient has no prior history of Echocardiogram examinations.                  Risk Factors:Hypertension, Diabetes, Dyslipidemia and Sleep                  Apnea.  Sonographer:     Charmayne Sheer Referring Phys:  Sh[place]augh Diagnosing Phys: Neoma Laming  Sonographer Comments: Suboptimal subcostal window. IMPRESSIONS  1. Left ventricular ejection fraction, by estimation, is 60 to 65%. The left ventricle has normal function. The left ventricle has no regional wall motion abnormalities. Left ventricular diastolic parameters were normal.  2. Right ventricular systolic function is normal. The right ventricular size is normal.  3. Left atrial size was mildly dilated.  4. Right atrial size was mildly dilated.  5. The mitral valve is normal in structure. Trivial mitral valve regurgitation. No evidence of mitral stenosis.  6. The aortic valve is normal in structure. Aortic valve regurgitation is not visualized. No aortic stenosis is present.  7. The inferior vena cava is normal in size with greater than 50% respiratory variability, suggesting right atrial pressure of 3 mmHg. FINDINGS  Left Ventricle: Left ventricular  ejection fraction, by estimation, is 60 to 65%. The left ventricle has normal function. The left ventricle has no regional wall motion abnormalities. The left ventricular internal cavity size was normal in size. There is  no left ventricular hypertrophy. Left ventricular diastolic parameters were normal. Right Ventricle: The right ventricular size is normal. No increase in right ventricular wall thickness. Right ventricular systolic function is normal. Left Atrium: Left atrial size was mildly dilated. Right Atrium: Right atrial size was mildly dilated. Pericardium: There is no evidence of pericardial effusion. Mitral Valve: The mitral valve is normal in structure. Trivial mitral valve regurgitation. No evidence of mitral valve stenosis. Tricuspid Valve: The tricuspid valve is normal in structure. Tricuspid valve regurgitation is trivial. No evidence of tricuspid stenosis. Aortic Valve: The aortic valve is normal in structure. Aortic valve regurgitation is not visualized. No aortic stenosis is present. Aortic valve mean gradient measures 5.0 mmHg. Aortic valve peak gradient measures 9.0 mmHg. Aortic valve area, by VTI measures 3.41 cm. Pulmonic Valve: The pulmonic valve was normal in structure. Pulmonic valve regurgitation is not visualized. No evidence of pulmonic stenosis. Aorta: The aortic root is normal in size and structure. Venous: The inferior vena cava is normal in size with greater than 50% respiratory variability, suggesting right atrial pressure of 3 mmHg. IAS/Shunts: No atrial level shunt detected by color flow Doppler.  LEFT VENTRICLE PLAX 2D LVIDd:         3.70 cm   Diastology LVIDs:         2.40 cm   LV e' medial:    10.00 cm/s LV PW:         1.30 cm   LV E/e' medial:  8.1 LV IVS:        0.80 cm   LV e' lateral:   11.60 cm/s LVOT diam:     2.20 cm   LV E/e' lateral: 7.0 LV SV:         100 LV SV Index:   40 LVOT Area:     3.80 cm  RIGHT VENTRICLE RV Basal diam:  3.40 cm RV S prime:     17.50 cm/s LEFT  ATRIUM  Index        RIGHT ATRIUM           Index LA diam:        3.30 cm 1.34 cm/m   RA Area:     17.00 cm LA Vol (A2C):   43.8 ml 17.74 ml/m  RA Volume:   43.80 ml  17.74 ml/m LA Vol (A4C):   31.4 ml 12.72 ml/m LA Biplane Vol: 37.3 ml 15.11 ml/m  AORTIC VALVE                     PULMONIC VALVE AV Area (Vmax):    3.24 cm      PV Vmax:       1.25 m/s AV Area (Vmean):   3.16 cm      PV Vmean:      89.000 cm/s AV Area (VTI):     3.41 cm      PV VTI:        0.218 m AV Vmax:           150.00 cm/s   PV Peak grad:  6.2 mmHg AV Vmean:          108.000 cm/s  PV Mean grad:  4.0 mmHg AV VTI:            0.292 m AV Peak Grad:      9.0 mmHg AV Mean Grad:      5.0 mmHg LVOT Vmax:         128.00 cm/s LVOT Vmean:        89.700 cm/s LVOT VTI:          0.262 m LVOT/AV VTI ratio: 0.90  AORTA Ao Root diam: 3.80 cm MITRAL VALVE MV Area (PHT): 3.53 cm    SHUNTS MV Decel Time: 215 msec    Systemic VTI:  0.26 m MV E velocity: 81.20 cm/s  Systemic Diam: 2.20 cm MV A velocity: 85.40 cm/s MV E/A ratio:  0.95 Shaukat Khan Electronically signed by Neoma Laming Signature Date/Time: 01/29/2022/10:34:09 AM    Final     Scheduled Meds:  atorvastatin  20 mg Oral QPM   carvedilol  6.25 mg Oral BID WC   dutasteride  0.5 mg Oral Daily   furosemide  20 mg Oral Daily   insulin aspart  0-15 Units Subcutaneous TID WC   lisinopril  10 mg Oral Daily   methimazole  10 mg Oral Daily   methocarbamol  750 mg Oral TID   multivitamin with minerals  1 tablet Oral Daily   pantoprazole  40 mg Oral Daily   potassium chloride  10 mEq Oral Daily   pregabalin  150 mg Oral QHS   pregabalin  75 mg Oral Daily   Continuous Infusions:  diltiazem (CARDIZEM) infusion Stopped (01/29/22 0508)     LOS: 2 days    Time spent: 35 mins    Jenille Laszlo, MD [facility]   If 7PM-7AM, please contact night-coverage

## 2022-01-30 NOTE — Progress Notes (Signed)
Mobility Specialist - Progress Note   01/30/22 1045  Mobility  Activity Ambulated independently in room;Ambulated independently to bathroom;Stood at bedside;Dangled on edge of bed  Level of Assistance Independent  Assistive Device None  Distance Ambulated (ft) 15 ft  Activity Response Tolerated well  Mobility Referral Yes  $Mobility charge 1 Mobility   Pt OOB on RA upon arrival. Pt ambulates to/ bathroom indep. Pt returns to EOB with needs in reach.   Gretchen Short  Mobility Specialist  01/30/22 10:46 AM

## 2022-01-31 DIAGNOSIS — I4891 Unspecified atrial fibrillation: Secondary | ICD-10-CM | POA: Diagnosis not present

## 2022-01-31 LAB — GLUCOSE, CAPILLARY
Glucose-Capillary: 152 mg/dL — ABNORMAL HIGH (ref 70–99)
Glucose-Capillary: 245 mg/dL — ABNORMAL HIGH (ref 70–99)
Glucose-Capillary: 158 mg/dL — ABNORMAL HIGH (ref 70–99)
Glucose-Capillary: 187 mg/dL — ABNORMAL HIGH (ref 70–99)

## 2022-01-31 NOTE — Progress Notes (Signed)
SUBJECTIVE: Patient is feeling much better denies any chest pain or shortness of breath   Vitals:   01/30/22 1628 01/30/22 2035 01/31/22 0538 01/31/22 0811  BP: 120/69 127/73 126/74 139/83  Pulse: 78 84 86 82  Resp: 18   16  Temp: 97.9 F (36.6 C) 98.2 F (36.8 C) 98.1 F (36.7 C) 98.2 F (36.8 C)  TempSrc: Oral Oral Oral Oral  SpO2: 100% 98% 98% 99%  Weight:      Height:        Intake/Output Summary (Last 24 hours) at 01/31/2022 1118 Last data filed at 01/30/2022 2031 Gross per 24 hour  Intake 120 ml  Output 1 ml  Net 119 ml    LABS: Basic Metabolic Panel: Recent Labs    01/29/22 0503 01/30/22 0622  NA 131* 138  K 3.3* 3.8  CL 101 107  CO2 21* 25  GLUCOSE 299* 180*  BUN 16 15  CREATININE 0.58* 0.53*  CALCIUM 8.3* 9.0  MG  --  1.8  PHOS  --  4.1   Liver Function Tests: No results for input(s): "AST", "ALT", "ALKPHOS", "BILITOT", "PROT", "ALBUMIN" in the last 72 hours. No results for input(s): "LIPASE", "AMYLASE" in the last 72 hours. CBC: Recent Labs    01/29/22 0503 01/30/22 0622  WBC 3.9* 2.1*  HGB 15.3 14.9  HCT 44.9 43.0  MCV 86.2 86.0  PLT 44* 39*   Cardiac Enzymes: No results for input(s): "CKTOTAL", "CKMB", "CKMBINDEX", "TROPONINI" in the last 72 hours. BNP: Invalid input(s): "POCBNP" D-Dimer: No results for input(s): "DDIMER" in the last 72 hours. Hemoglobin A1C: No results for input(s): "HGBA1C" in the last 72 hours. Fasting Lipid Panel: No results for input(s): "CHOL", "HDL", "LDLCALC", "TRIG", "CHOLHDL", "LDLDIRECT" in the last 72 hours. Thyroid Function Tests: Recent Labs    01/30/22 0622  TSH <0.010*   Anemia Panel: No results for input(s): "VITAMINB12", "FOLATE", "FERRITIN", "TIBC", "IRON", "RETICCTPCT" in the last 72 hours.   PHYSICAL EXAM General: Well developed, well nourished, in no acute distress HEENT:  Normocephalic and atramatic Neck:  No JVD.  Lungs: Clear bilaterally to auscultation and percussion. Heart: HRRR  . Normal S1 and S2 without gallops or murmurs.  Abdomen: Bowel sounds are positive, abdomen soft and non-tender  Msk:  Back normal, normal gait. Normal strength and tone for age. Extremities: No clubbing, cyanosis or edema.   Neuro: Alert and oriented X 3. Psych:  Good affect, responds appropriately  TELEMETRY: Normal sinus rhythm about 75 bpm  ASSESSMENT AND PLAN: Status post atrial fibrillation with rapid ventricular response rate.  Right now in normal sinus rhythm.  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  Patient was explained all this and he is agreement that there is a risk of stroke but he cannot tolerate anticoagulants.  Principal Problem:   Atrial fibrillation with rapid ventricular response (HCC) Active Problems:   Thrombocytopenia ([place])   Essential hypertension   GERD (gastroesophageal reflux disease)   Diabetes mellitus with hyperglycemia ([place])   Cirrhosis of liver without ascites (HCC)    Hannia Matchett A, MD, [facility] 01/31/2022 11:18 AM

## 2022-01-31 NOTE — Progress Notes (Signed)
PROGRESS NOTE    Juan Garza  WNI:[id number] DOB: Jun 24, 1960 DOA: 01/28/2022 PCP: Pcp, No    Brief Narrative:  This 61 years old male with PMH significant for diabetes, GERD, history of liver cirrhosis, hypertension presented to the ED for the evaluation of not feeling well.  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 he developed palpitations and lightheadedness.  He went home and checked his pulse ox and noted some fluctuation in his oxygen level. His heart rate ranged between 100 and 140 associated with intermittent chest pain radiating towards his jaw. EKG in the ER shows A-fib with RVR. Patient is started on Cardizem gtt. Cardiology is consulted.  Heart rate is now well-controlled.  Assessment & Plan:   Principal Problem:   Atrial fibrillation with rapid ventricular response (HCC) Active Problems:   Thrombocytopenia ([place])   Diabetes mellitus with hyperglycemia ([place])   Essential hypertension   GERD (gastroesophageal reflux disease)   Cirrhosis of liver without ascites ([place])  A. Fib with RVR: Patient presented in the ED for the evaluation of lightheadedness and palpitations. EKG showed new onset A-fib. Heart rate is now well-controlled on Cardizem gtt. Patient has a CHA2DS2-VASc score of 2 and requires long-term anticoagulation as primary prophylaxis for acute stroke but due to significant thrombocytopenia which increases his risk of bleeding, will defer anticoagulation at this time. Patient has a very low TSH concerning for hyperthyroidism.  Thyroid profile consistent with hyperthyroidism. 2D echo shows LVEF 60 to 65%, no regional wall motion abnormalities.  No valvular pathology. Cardiology consulted , recommended to continue Cardizem. Avoid amiodarone because of her liver disease and avoid anticoagulation because of thrombocytopenia.  Thrombocytopenia: Secondary to liver cirrhosis with portal hypertension. No evidence of bleeding at this time. Monitor closely  during this hospitalization Hematologist consulted.   Diabetes mellitus with hyperglycemia: Hold Ozempic and Jardiance during this hospitalization. Maintain consistent carbohydrate diet Glycemic control with sliding scale insulin.   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 Continue Carvedilol and furosemide.   GERD: Continue Pantoprazole 40 mg daily   Essential hypertension: Continue Coreg 6.25 mg every 12 hours.  Hyperthyroidism: Continue methimazole 10 mg 3 times daily. Recheck thyroid profile in 4 to 6 weeks.:   DVT prophylaxis:  SCDs Code Status: Full code Family Communication:  No family at bed side. Disposition Plan:   Status is: Inpatient Remains inpatient appropriate because: Admitted for chest pain and A-fib with RVR.  He is found to have hypothyroidism on thyroid profile.  He is started on methimazole 10 mg 3 times daily.    Consultants:  Cardiology  Procedures: None Antimicrobials: None  Subjective: Patient was seen and examined at bedside.  Overnight events noted. Patient reports doing much better, denies any chest pain,  palpitations or shortness of breath. Patient wants to switch to a different hospitalist.  Will be seen by Dr. Laurel Dimmer tomorrow.  Objective: Vitals:   01/30/22 1628 01/30/22 2035 01/31/22 0538 01/31/22 0811  BP: 120/69 127/73 126/74 139/83  Pulse: 78 84 86 82  Resp: 18   16  Temp: 97.9 F (36.6 C) 98.2 F (36.8 C) 98.1 F (36.7 C) 98.2 F (36.8 C)  TempSrc: Oral Oral Oral Oral  SpO2: 100% 98% 98% 99%  Weight:      Height:        Intake/Output Summary (Last 24 hours) at 01/31/2022 1132 Last data filed at 01/30/2022 2031 Gross per 24 hour  Intake 120 ml  Output 1 ml  Net 119 ml   Filed Weights   01/28/22 0842 01/29/22 2055  Weight: 122.5 kg 115.7 kg    Examination:  General exam: Appears comfortable, not in any acute distress.   Deconditioned. Respiratory system: CTA bilaterally, respiratory for normal, RR 15. Cardiovascular system: S1-S2 heard, Irregular rhythm, no murmur. Gastrointestinal system: Abdomen is soft, mildly distended, nontender, BS+ Central nervous system: Alert and oriented x 3. No focal neurological deficits. Extremities: No edema, no cyanosis, no clubbing Skin: No rashes, lesions or ulcers Psychiatry: Judgement and insight appear normal. Mood & affect appropriate.     Data Reviewed: I have personally reviewed following labs and imaging studies  CBC: Recent Labs  Lab 01/28/22 0851 01/29/22 0503 01/30/22 0622  WBC 4.5 3.9* 2.1*  HGB 17.0 15.3 14.9  HCT 49.8 44.9 43.0  MCV 87.1 86.2 86.0  PLT 54* 44* 39*   Basic Metabolic Panel: Recent Labs  Lab 01/28/22 0850 01/28/22 0851 01/29/22 0503 01/30/22 0622  NA  --  138 131* 138  K  --  3.9 3.3* 3.8  CL  --  105 101 107  CO2  --  22 21* 25  GLUCOSE  --  251* 299* 180*  BUN  --  '15 16 15  '$ CREATININE  --  0.57* 0.58* 0.53*  CALCIUM  --  9.1 8.3* 9.0  MG 1.9  --   --  1.8  PHOS  --   --   --  4.1   GFR: Estimated Creatinine Clearance: 128.9 mL/min (A) (by C-G formula based on SCr of 0.53 mg/dL (L)). Liver Function Tests: Recent Labs  Lab 01/28/22 0850  AST 50*  ALT 42  ALKPHOS 73  BILITOT 3.7*  PROT 6.6  ALBUMIN 4.0   No results for input(s): "LIPASE", "AMYLASE" in the last 168 hours. No results for input(s): "AMMONIA" in the last 168 hours. Coagulation Profile: No results for input(s): "INR", "PROTIME" in the last 168 hours. Cardiac Enzymes: No results for input(s): "CKTOTAL", "CKMB", "CKMBINDEX", "TROPONINI" in the last 168 hours. BNP (last 3 results) No results for input(s): "PROBNP" in the last 8760 hours. HbA1C: No results for input(s): "HGBA1C" in the last 72 hours. CBG: Recent Labs  Lab 01/30/22 0846 01/30/22 1209 01/30/22 1629 01/30/22 2110 01/31/22 0758  GLUCAP 170* 215* 168* 175* 152*   Lipid  Profile: No results for input(s): "CHOL", "HDL", "LDLCALC", "TRIG", "CHOLHDL", "LDLDIRECT" in the last 72 hours. Thyroid Function Tests: Recent Labs    01/30/22 0622  TSH <0.010*  FREET4 1.75*   Anemia Panel: No results for input(s): "VITAMINB12", "FOLATE", "FERRITIN", "TIBC", "IRON", "RETICCTPCT" in the last 72 hours. Sepsis Labs: No results for input(s): "PROCALCITON", "LATICACIDVEN" in the last 168 hours.  No results found for this or any previous visit (from the past 240 hour(s)).   Radiology Studies: No results found.  Scheduled Meds:  atorvastatin  20 mg Oral QPM   carvedilol  6.25 mg Oral BID WC   dutasteride  0.5 mg Oral Daily   furosemide  20 mg Oral Daily   insulin aspart  0-15 Units Subcutaneous TID WC   lisinopril  10 mg Oral Daily   methimazole  10 mg Oral Daily   methocarbamol  750 mg Oral TID   multivitamin with minerals  1 tablet Oral Daily   pantoprazole  40 mg Oral Daily   potassium chloride  10 mEq Oral Daily   pregabalin  150 mg Oral QHS  pregabalin  75 mg Oral Daily   Continuous Infusions:  diltiazem (CARDIZEM) infusion Stopped (01/29/22 0508)     LOS: 3 days    Time spent: 35 mins    Elius Etheredge, MD [facility]   If 7PM-7AM, please contact night-coverage

## 2022-02-01 DIAGNOSIS — E0590 Thyrotoxicosis, unspecified without thyrotoxic crisis or storm: Secondary | ICD-10-CM

## 2022-02-01 DIAGNOSIS — D696 Thrombocytopenia, unspecified: Secondary | ICD-10-CM | POA: Diagnosis not present

## 2022-02-01 DIAGNOSIS — I4891 Unspecified atrial fibrillation: Secondary | ICD-10-CM

## 2022-02-01 DIAGNOSIS — K7460 Unspecified cirrhosis of liver: Secondary | ICD-10-CM | POA: Diagnosis not present

## 2022-02-01 LAB — GLUCOSE, CAPILLARY
Glucose-Capillary: 194 mg/dL — ABNORMAL HIGH (ref 70–99)
Glucose-Capillary: 276 mg/dL — ABNORMAL HIGH (ref 70–99)

## 2022-02-01 MED ORDER — CARVEDILOL 12.5 MG PO TABS: 6.2500 mg | ORAL_TABLET | Freq: Two times a day (BID) | ORAL | 2 refills | Status: DC

## 2022-02-01 MED ORDER — METHIMAZOLE 10 MG PO TABS: 10.0000 mg | ORAL_TABLET | Freq: Every day | ORAL | 3 refills | Status: DC

## 2022-02-01 NOTE — Progress Notes (Signed)
SUBJECTIVE: Patient denies any chest pain or shortness of breath   Vitals:   02/01/22 0100 02/01/22 0451 02/01/22 0818 02/01/22 1133  BP: 115/75 (!) 99/41 132/75 134/73  Pulse: 90 88 75 81  Resp: '18 18 18 18  '$ Temp: 98 F (36.7 C) 97.8 F (36.6 C) 97.9 F (36.6 C) 98 F (36.7 C)  TempSrc: Oral Oral Oral Oral  SpO2: 98% 97% 99% 98%  Weight:      Height:        Intake/Output Summary (Last 24 hours) at 02/01/2022 1151 Last data filed at 01/31/2022 1700 Gross per 24 hour  Intake 240 ml  Output 2 ml  Net 238 ml    LABS: Basic Metabolic Panel: Recent Labs    01/30/22 0622  NA 138  K 3.8  CL 107  CO2 25  GLUCOSE 180*  BUN 15  CREATININE 0.53*  CALCIUM 9.0  MG 1.8  PHOS 4.1   Liver Function Tests: No results for input(s): "AST", "ALT", "ALKPHOS", "BILITOT", "PROT", "ALBUMIN" in the last 72 hours. No results for input(s): "LIPASE", "AMYLASE" in the last 72 hours. CBC: Recent Labs    01/30/22 0622  WBC 2.1*  HGB 14.9  HCT 43.0  MCV 86.0  PLT 39*   Cardiac Enzymes: No results for input(s): "CKTOTAL", "CKMB", "CKMBINDEX", "TROPONINI" in the last 72 hours. BNP: Invalid input(s): "POCBNP" D-Dimer: No results for input(s): "DDIMER" in the last 72 hours. Hemoglobin A1C: No results for input(s): "HGBA1C" in the last 72 hours. Fasting Lipid Panel: No results for input(s): "CHOL", "HDL", "LDLCALC", "TRIG", "CHOLHDL", "LDLDIRECT" in the last 72 hours. Thyroid Function Tests: Recent Labs    01/30/22 0622  TSH <0.010*   Anemia Panel: No results for input(s): "VITAMINB12", "FOLATE", "FERRITIN", "TIBC", "IRON", "RETICCTPCT" in the last 72 hours.   PHYSICAL EXAM General: Well developed, well nourished, in no acute distress HEENT:  Normocephalic and atramatic Neck:  No JVD.  Lungs: Clear bilaterally to auscultation and percussion. Heart: HRRR . Normal S1 and S2 without gallops or murmurs.  Abdomen: Bowel sounds are positive, abdomen soft and non-tender  Msk:   Back normal, normal gait. Normal strength and tone for age. Extremities: No clubbing, cyanosis or edema.   Neuro: Alert and oriented X 3. 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  Patient is not a candidate for anticoagulation.  Patient can be discharged home on Cardizem and remains in sinus rhythm.  Advised discharging him on this medication today with follow-up in the office Dr. Marcelline Deist in 1 to 2 weeks.  Principal Problem:   Atrial fibrillation with rapid ventricular response (HCC) Active Problems:   Thrombocytopenia ([place])   Essential hypertension   GERD (gastroesophageal reflux disease)   Diabetes mellitus with hyperglycemia ([place])   Cirrhosis of liver without ascites ([place])    Romain Erion A, MD, [facility] 02/01/2022 11:51 AM

## 2022-02-01 NOTE — Discharge Summary (Signed)
Physician Discharge Summary   Patient: Juan Garza MRN: [id number] DOB: March 30, 1960  Admit date:     01/28/2022  Discharge date: 02/01/22  Discharge Physician: Juan Garza   PCP: Juan Lange, NP   Recommendations at discharge:   New medication: Tapazole 10 mg p.o. daily Medication change: Coreg increased from 6.25 to 12.5 mg p.o. twice daily  Discharge Diagnoses: Principal Problem:   Atrial fibrillation with rapid ventricular response ([place]) Active Problems:   Thrombocytopenia ([place])   Diabetes mellitus with hyperglycemia ([place])   Essential hypertension   GERD (gastroesophageal reflux disease)   Cirrhosis of liver without ascites ([place])   Hyperthyroidism  Resolved Problems:   * No resolved hospital problems. *  Hospital Course: 61 year old male with past medical history of diabetes mellitus, hypertension and liver cirrhosis who presented to the emergency room on 12/21 with palpitations and lightheadedness that had started earlier that day.  In the emergency room, patient found to be in rapid atrial fibrillation (new diagnosis) and started on Cardizem drip.  Cardiology consulted.  Assessment and Plan: * Atrial fibrillation with rapid ventricular response [facility]) Appreciate cardiology assistance.  Heart rate controlled on Cardizem drip.  CHADS2 score of 2, but due to significant chronic thrombocytopenia, high risk of bleeding so at this time, coagulation deferred.  Workup revealed suspected hyperthyroidism.  See below.  2D echo avoid amiodarone because of liver disease.  Once heart rate under control, converted back to normal sinus rhythm.  Thrombocytopenia ([place]) Secondary to liver cirrhosis with portal hypertension No evidence of bleeding at this time, not a candidate for anticoagulation because of this  Diabetes mellitus with hyperglycemia ([place]) Patient has diabetes mellitus with hyperglycemia Hold Ozempic and Jardiance during this hospitalization Maintain  consistent carbohydrate diet Glycemic control with sliding scale insulin  Hyperthyroidism Workup for atrial fibrillation revealed suppressed TSH and mildly elevated free T4.  Started on Tapazole.  Recommend repeat thyroid function studies in approximately several months or if PCP prefers, can refer to endocrinology.  Cirrhosis of liver without ascites ([place]) Patient has a history of liver cirrhosis with evidence of portal hypertension Continue Carvedilol and furosemide  GERD (gastroesophageal reflux disease) Continue PPI  Essential hypertension Once heart rate under control, able to be weaned off of Cardizem drip.  On discharge, Coreg increased to 12.5 p.o. twice daily         Consultants: Cardiology Procedures performed: Echocardiogram noting mildly dilated left and right atria Disposition: Home Diet recommendation:  Discharge Diet Orders (From admission, onward)     Start     Ordered   02/01/22 0000  Diet - low sodium heart healthy        02/01/22 1242           Cardiac and Carb modified diet DISCHARGE MEDICATION: Allergies as of 02/01/2022       Reactions   Quetiapine Other (See Comments)   Uncontrolled leg movements Other reaction(s): Other (See Comments), Other (See Comments), Other (See Comments) Leg spasms "uncontrollable leg kicking" Uncontrolled leg movements Leg spasms "uncontrollable leg kicking"   Nsaids Other (See Comments)   Adhesive [tape] Itching, Rash   Imitrex [sumatriptan] Other (See Comments)   shaking   Seroquel [quetiapine Fumarate] Other (See Comments)   Leg spasms        Medication List     TAKE these medications    acetaminophen 500 MG tablet Commonly known as: TYLENOL Take 500-1,000 mg by mouth every 6 (six) hours as needed for mild pain or moderate pain.  ascorbic acid 500 MG tablet Commonly known as: VITAMIN C Take 1,000 mg by mouth daily.   atorvastatin 20 MG tablet Commonly known as: LIPITOR Take 20 mg by mouth  daily.   carvedilol 12.5 MG tablet Commonly known as: COREG Take 0.5 tablets (6.25 mg total) by mouth 2 (two) times daily with a meal. What changed: medication strength   dutasteride 0.5 MG capsule Commonly known as: AVODART Take 0.5 mg by mouth daily.   empagliflozin 25 MG Tabs tablet Commonly known as: JARDIANCE Take 25 mg by mouth daily.   furosemide 20 MG tablet Commonly known as: LASIX Take 20 mg by mouth daily as needed for fluid or edema.   gabapentin 300 MG capsule Commonly known as: NEURONTIN Take 300 mg by mouth 2 (two) times daily.   lisinopril 10 MG tablet Commonly known as: ZESTRIL Take 10 mg by mouth daily.   methimazole 10 MG tablet Commonly known as: TAPAZOLE Take 1 tablet (10 mg total) by mouth daily. Start taking on: February 02, 2022   methocarbamol 750 MG tablet Commonly known as: ROBAXIN Take 750 mg by mouth every 8 (eight) hours as needed for muscle spasms.   ondansetron 8 MG tablet Commonly known as: ZOFRAN Take 8 mg by mouth every 8 (eight) hours as needed for nausea or vomiting.   Ozempic (0.25 or 0.5 MG/DOSE) 2 MG/1.5ML Sopn Generic drug: Semaglutide(0.25 or 0.'5MG'$ /DOS) Inject 0.5 mg into the skin once a week.   pregabalin 75 MG capsule Commonly known as: LYRICA Take 75 mg by mouth See admin instructions. Take 1 capsule ('75mg'$ ) by mouth every morning and take 2 capsules ('150mg'$ ) by mouth every night at bedtime        Follow-up Information     Gauger, Juan Lain, NP Follow up in 1 month(s).   Specialty: Internal Medicine Contact information: [place] [address] [phone]                Discharge Exam: Danley Danker Weights   01/28/22 0842 01/29/22 2055  Weight: 122.5 kg 115.7 kg   General: Alert and oriented x 3, no acute distress Cardiovascular: Regular rate and rhythm, S1-S2 Lungs: Clear to auscultation bilaterally  Condition at discharge: good  The results of significant diagnostics from this  hospitalization (including imaging, microbiology, ancillary and laboratory) are listed below for reference.   Imaging Studies: ECHOCARDIOGRAM COMPLETE  Result Date: 01/29/2022    ECHOCARDIOGRAM REPORT   Patient Name:   Juan Garza Date of Exam: 01/29/2022 Medical Rec #:  [id number]    Height:       74.0 in Accession #:    [id number]   Weight:       270.0 lb Date of Birth:  03-Jan-1961    BSA:          2.469 m Patient Age:    45 years     BP:           109/68 mmHg Patient Gender: M            HR:           77 bpm. Exam Location:  [facility] Procedure: 2D Echo, Color Doppler and Cardiac Doppler Indications:     I48.91 Atrial fibrillation  History:         Patient has no prior history of Echocardiogram examinations.                  Risk Factors:Hypertension, Diabetes, Dyslipidemia and Sleep  Apnea.  Sonographer:     Charmayne Sheer Referring Phys:  [id number] AGBATA Diagnosing Phys: Neoma Laming  Sonographer Comments: Suboptimal subcostal window. IMPRESSIONS  1. Left ventricular ejection fraction, by estimation, is 60 to 65%. The left ventricle has normal function. The left ventricle has no regional wall motion abnormalities. Left ventricular diastolic parameters were normal.  2. Right ventricular systolic function is normal. The right ventricular size is normal.  3. Left atrial size was mildly dilated.  4. Right atrial size was mildly dilated.  5. The mitral valve is normal in structure. Trivial mitral valve regurgitation. No evidence of mitral stenosis.  6. The aortic valve is normal in structure. Aortic valve regurgitation is not visualized. No aortic stenosis is present.  7. The inferior vena cava is normal in size with greater than 50% respiratory variability, suggesting right atrial pressure of 3 mmHg. FINDINGS  Left Ventricle: Left ventricular ejection fraction, by estimation, is 60 to 65%. The left ventricle has normal function. The left ventricle has no regional wall motion abnormalities. The  left ventricular internal cavity size was normal in size. There is  no left ventricular hypertrophy. Left ventricular diastolic parameters were normal. Right Ventricle: The right ventricular size is normal. No increase in right ventricular wall thickness. Right ventricular systolic function is normal. Left Atrium: Left atrial size was mildly dilated. Right Atrium: Right atrial size was mildly dilated. Pericardium: There is no evidence of pericardial effusion. Mitral Valve: The mitral valve is normal in structure. Trivial mitral valve regurgitation. No evidence of mitral valve stenosis. Tricuspid Valve: The tricuspid valve is normal in structure. Tricuspid valve regurgitation is trivial. No evidence of tricuspid stenosis. Aortic Valve: The aortic valve is normal in structure. Aortic valve regurgitation is not visualized. No aortic stenosis is present. Aortic valve mean gradient measures 5.0 mmHg. Aortic valve peak gradient measures 9.0 mmHg. Aortic valve area, by VTI measures 3.41 cm. Pulmonic Valve: The pulmonic valve was normal in structure. Pulmonic valve regurgitation is not visualized. No evidence of pulmonic stenosis. Aorta: The aortic root is normal in size and structure. Venous: The inferior vena cava is normal in size with greater than 50% respiratory variability, suggesting right atrial pressure of 3 mmHg. IAS/Shunts: No atrial level shunt detected by color flow Doppler.  LEFT VENTRICLE PLAX 2D LVIDd:         3.70 cm   Diastology LVIDs:         2.40 cm   LV e' medial:    10.00 cm/s LV PW:         1.30 cm   LV E/e' medial:  8.1 LV IVS:        0.80 cm   LV e' lateral:   11.60 cm/s LVOT diam:     2.20 cm   LV E/e' lateral: 7.0 LV SV:         100 LV SV Index:   40 LVOT Area:     3.80 cm  RIGHT VENTRICLE RV Basal diam:  3.40 cm RV S prime:     17.50 cm/s LEFT ATRIUM             Index        RIGHT ATRIUM           Index LA diam:        3.30 cm 1.34 cm/m   RA Area:     17.00 cm LA Vol (A2C):   43.8 ml 17.74  ml/m  RA Volume:   43.80  ml  17.74 ml/m LA Vol (A4C):   31.4 ml 12.72 ml/m LA Biplane Vol: 37.3 ml 15.11 ml/m  AORTIC VALVE                     PULMONIC VALVE AV Area (Vmax):    3.24 cm      PV Vmax:       1.25 m/s AV Area (Vmean):   3.16 cm      PV Vmean:      89.000 cm/s AV Area (VTI):     3.41 cm      PV VTI:        0.218 m AV Vmax:           150.00 cm/s   PV Peak grad:  6.2 mmHg AV Vmean:          108.000 cm/s  PV Mean grad:  4.0 mmHg AV VTI:            0.292 m AV Peak Grad:      9.0 mmHg AV Mean Grad:      5.0 mmHg LVOT Vmax:         128.00 cm/s LVOT Vmean:        89.700 cm/s LVOT VTI:          0.262 m LVOT/AV VTI ratio: 0.90  AORTA Ao Root diam: 3.80 cm MITRAL VALVE MV Area (PHT): 3.53 cm    SHUNTS MV Decel Time: 215 msec    Systemic VTI:  0.26 m MV E velocity: 81.20 cm/s  Systemic Diam: 2.20 cm MV A velocity: 85.40 cm/s MV E/A ratio:  0.95 Shaukat Khan Electronically signed by Neoma Laming Signature Date/Time: 01/29/2022/10:34:09 AM    Final    DG Chest Port 1 View  Result Date: 01/28/2022 CLINICAL DATA:  Lightheadedness EXAM: PORTABLE CHEST 1 VIEW COMPARISON:  None Available. FINDINGS: The heart size and mediastinal contours are within normal limits. Pulmonary vascular congestion. No focal consolidation. No pneumothorax or pleural effusion. IMPRESSION: Pulmonary vascular congestion.  No focal consolidation. Electronically Signed   By: Sammie Bench M.D.   On: 01/28/2022 09:18    Microbiology: Results for orders placed or performed during the hospital encounter of 01/18/20  SARS CORONAVIRUS 2 (TAT 6-24 HRS) Nasopharyngeal Nasopharyngeal Swab     Status: None   Collection Time: 01/18/20  2:18 PM   Specimen: Nasopharyngeal Swab  Result Value Ref Range Status   SARS Coronavirus 2 NEGATIVE NEGATIVE Final    Comment: (NOTE) SARS-CoV-2 target nucleic acids are NOT DETECTED.  The SARS-CoV-2 RNA is generally detectable in upper and lower respiratory specimens during the acute phase of  infection. Negative results do not preclude SARS-CoV-2 infection, do not rule out co-infections with other pathogens, and should not be used as the sole basis for treatment or other patient management decisions. Negative results must be combined with clinical observations, patient history, and epidemiological information. The expected result is Negative.  Fact Sheet for Patients: SugarRoll.be  Fact Sheet for Healthcare Providers: https://www.woods-mathews.com/  This test is not yet approved or cleared by the [place] FDA and  has been authorized for detection and/or diagnosis of SARS-CoV-2 by FDA under an Emergency Use Authorization (EUA). This EUA will remain  in effect (meaning this test can be used) for the duration of the COVID-19 declaration under Se ction 564(b)(1) of the Act, 21 U.S.C. section 360bbb-3(b)(1), unless the authorization is terminated or revoked sooner.  Performed at [facility], [place]  [address], [place], [place] [address]     Labs: CBC: Recent Labs  Lab 01/28/22 0851 01/29/22 0503 01/30/22 0622  WBC 4.5 3.9* 2.1*  HGB 17.0 15.3 14.9  HCT 49.8 44.9 43.0  MCV 87.1 86.2 86.0  PLT 54* 44* 39*   Basic Metabolic Panel: Recent Labs  Lab 01/28/22 0850 01/28/22 0851 01/29/22 0503 01/30/22 0622  NA  --  138 131* 138  K  --  3.9 3.3* 3.8  CL  --  105 101 107  CO2  --  22 21* 25  GLUCOSE  --  251* 299* 180*  BUN  --  '15 16 15  '$ CREATININE  --  0.57* 0.58* 0.53*  CALCIUM  --  9.1 8.3* 9.0  MG 1.9  --   --  1.8  PHOS  --   --   --  4.1   Liver Function Tests: Recent Labs  Lab 01/28/22 0850  AST 50*  ALT 42  ALKPHOS 73  BILITOT 3.7*  PROT 6.6  ALBUMIN 4.0   CBG: Recent Labs  Lab 01/31/22 1203 01/31/22 1551 01/31/22 2034 02/01/22 0821 02/01/22 1131  GLUCAP 245* 158* 187* 194* 276*    Discharge time spent: less than 30 minutes.  Signed: Annita Brod, MD [facility] 02/01/2022

## 2022-02-01 NOTE — Consult Note (Signed)
[facility] NOTE  Patient Care Team: Gauger, Victoriano Lain, NP as PCP - General (Internal Medicine) Efrain Sella, MD as Consulting Physician (Gastroenterology)  CHIEF COMPLAINTS/PURPOSE OF CONSULTATION: Leukopenia/thrombocytopenia  HISTORY OF PRESENTING ILLNESS:  Juan Garza 61 y.o.  male pleasant patient with history of diabetes history of cirrhosis; hypertension was been referred to [place] for further evaluation of leukopenia/thrombocytopenia.   Patient was admitted to the hospital when he developed palpitations/lightheadedness after his night shift.  Patient noted to have A-fib with RVR on hospital admission.  Patient was eval by cardiology started on Cardizem drip.  Patient did not receive anticoagulation given his platelets in the 30s to 40s on admission.  Of note patient also noted to have TSH less than 0.1.  Regards to thrombocytopenia-noted to have platelets in the range of 40s to 50s since the last 2 years or so.  Patient evaluated by Dr. Janese Banks with hematology with a bone marrow biopsy in 2021.  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  Patient notes that he has been under significant emotional stress given the sickness/death in his family.  Otherwise denies any fever or chills.  Any easy bruising or bleeding.   Review of Systems  Constitutional:  Positive for malaise/fatigue. Negative for chills, diaphoresis, fever and weight loss.  HENT:  Negative for nosebleeds and sore throat.   Eyes:  Negative for double vision.  Respiratory:  Positive for shortness of breath. Negative for cough, hemoptysis, sputum production and wheezing.   Cardiovascular:  Positive for palpitations. Negative for chest pain, orthopnea and leg swelling.  Gastrointestinal:  Negative for abdominal pain, blood in stool, constipation, diarrhea, heartburn, melena, nausea and vomiting.  Genitourinary:  Negative for dysuria, frequency and urgency.   Musculoskeletal:  Negative for back pain and joint pain.  Skin: Negative.  Negative for itching and rash.  Neurological:  Negative for dizziness, tingling, focal weakness, weakness and headaches.  Endo/Heme/Allergies:  Does not bruise/bleed easily.  Psychiatric/Behavioral:  Negative for depression. The patient is not nervous/anxious and does not have insomnia.    MEDICAL HISTORY:  Past Medical History:  Diagnosis Date   Arthritis    Chronic back pain    Cirrhosis ([place])    Colon polyps    Diabetes mellitus without complication (HCC)    GERD (gastroesophageal reflux disease)    GIB (gastrointestinal bleeding)    r/t NSAID use   Gout    Headache    Hyperlipidemia    Hypertension    Right-sided Bell's palsy    Sleep apnea     SURGICAL HISTORY: Past Surgical History:  Procedure Laterality Date   ABDOMINAL SURGERY     APPENDECTOMY     BACK SURGERY     posteriior lumbar spine fusion   CARDIAC CATHETERIZATION     colectomy partial w/anastoamosis     COLON SURGERY     COLONOSCOPY WITH PROPOFOL N/A 01/22/2020   Procedure: COLONOSCOPY WITH PROPOFOL;  Surgeon: Lesly Rubenstein, MD;  Location: [facility] ENDOSCOPY;  Service: Endoscopy;  Laterality: N/A;   ESOPHAGOGASTRODUODENOSCOPY N/A 01/29/2021   Procedure: ESOPHAGOGASTRODUODENOSCOPY (EGD);  Surgeon: Annamaria Helling, DO;  Location: [facility] ENDOSCOPY;  Service: Gastroenterology;  Laterality: N/A;  DM   ESOPHAGOGASTRODUODENOSCOPY (EGD) WITH PROPOFOL N/A 01/22/2020   Procedure: ESOPHAGOGASTRODUODENOSCOPY (EGD) WITH PROPOFOL;  Surgeon: Lesly Rubenstein, MD;  Location: [facility] ENDOSCOPY;  Service: Endoscopy;  Laterality: N/A;   HERNIA REPAIR     JOINT REPLACEMENT     KNEE SURGERY Left  NASAL SINUS SURGERY      SOCIAL HISTORY: Social History   Socioeconomic History   Marital status: Married    Spouse name: Not on file   Number of children: Not on file   Years of education: Not on file   Highest education level: Not on file   Occupational History   Not on file  Tobacco Use   Smoking status: Former   Smokeless tobacco: Never  Vaping Use   Vaping Use: Never used  Substance and Sexual Activity   Alcohol use: Never    Comment: none last 24hrs   Drug use: Never   Sexual activity: Not on file  Other Topics Concern   Not on file  Social History Narrative   Not on file   Social Determinants of Health   Financial Resource Strain: Not on file  Food Insecurity: No Food Insecurity (01/29/2022)   Hunger Vital Sign    Worried About Running Out of Food in the Last Year: Never true    Ran Out of Food in the Last Year: Never true  Transportation Needs: No Transportation Needs (01/29/2022)   PRAPARE - Hydrologist (Medical): No    Lack of Transportation (Non-Medical): No  Physical Activity: Not on file  Stress: Not on file  Social Connections: Not on file  Intimate Partner Violence: Not At Risk (01/29/2022)   Humiliation, Afraid, Rape, and Kick questionnaire    Fear of Current or Ex-Partner: No    Emotionally Abused: No    Physically Abused: No    Sexually Abused: No    FAMILY HISTORY: Family History  Problem Relation Age of Onset   Atrial fibrillation Mother    Diabetes Father     ALLERGIES:  is allergic to quetiapine, nsaids, adhesive [tape], imitrex [sumatriptan], and seroquel [quetiapine fumarate].  MEDICATIONS:  No current facility-administered medications for this encounter.   Current Outpatient Medications  Medication Sig Dispense Refill   acetaminophen (TYLENOL) 500 MG tablet Take 500-1,000 mg by mouth every 6 (six) hours as needed for mild pain or moderate pain.     ascorbic acid (VITAMIN C) 500 MG tablet Take 1,000 mg by mouth daily.     atorvastatin (LIPITOR) 20 MG tablet Take 20 mg by mouth daily.     dutasteride (AVODART) 0.5 MG capsule Take 0.5 mg by mouth daily.     empagliflozin (JARDIANCE) 25 MG TABS tablet Take 25 mg by mouth daily.     furosemide  (LASIX) 20 MG tablet Take 20 mg by mouth daily as needed for fluid or edema.     gabapentin (NEURONTIN) 300 MG capsule Take 300 mg by mouth 2 (two) times daily.     lisinopril (ZESTRIL) 10 MG tablet Take 10 mg by mouth daily.     methocarbamol (ROBAXIN) 750 MG tablet Take 750 mg by mouth every 8 (eight) hours as needed for muscle spasms.     ondansetron (ZOFRAN) 8 MG tablet Take 8 mg by mouth every 8 (eight) hours as needed for nausea or vomiting.     pregabalin (LYRICA) 75 MG capsule Take 75 mg by mouth See admin instructions. Take 1 capsule (93m) by mouth every morning and take 2 capsules (1539m by mouth every night at bedtime     carvedilol (COREG) 12.5 MG tablet Take 0.5 tablets (6.25 mg total) by mouth 2 (two) times daily with a meal. 60 tablet 2   [START ON 02/02/2022] methimazole (TAPAZOLE) 10 MG tablet Take 1 tablet (  10 mg total) by mouth daily. 30 tablet 3   Semaglutide,0.25 or 0.5MG/DOS, (OZEMPIC, 0.25 OR 0.5 MG/DOSE,) 2 MG/1.5ML SOPN Inject 0.5 mg into the skin once a week.       PHYSICAL EXAMINATION:   Vitals:   02/01/22 0818 02/01/22 1133  BP: 132/75 134/73  Pulse: 75 81  Resp: 18 18  Temp: 97.9 F (36.6 C) 98 F (36.7 C)  SpO2: 99% 98%   Filed Weights   01/28/22 0842 01/29/22 2055  Weight: 270 lb (122.5 kg) 255 lb (115.7 kg)   Positive for splenomegaly. Physical Exam Vitals and nursing note reviewed.  HENT:     Head: Normocephalic and atraumatic.     Mouth/Throat:     Pharynx: Oropharynx is clear.  Eyes:     Extraocular Movements: Extraocular movements intact.     Pupils: Pupils are equal, round, and reactive to light.  Cardiovascular:     Rate and Rhythm: Normal rate and regular rhythm.  Pulmonary:     Comments: Decreased breath sounds bilaterally.  Abdominal:     Palpations: Abdomen is soft.  Musculoskeletal:        General: Normal range of motion.     Cervical back: Normal range of motion.  Skin:    General: Skin is warm.  Neurological:      General: No focal deficit present.     Mental Status: He is alert and oriented to person, place, and time.  Psychiatric:        Behavior: Behavior normal.        Judgment: Judgment normal.      LABORATORY DATA:  I have reviewed the data as listed Lab Results  Component Value Date   WBC 2.1 (L) 01/30/2022   HGB 14.9 01/30/2022   HCT 43.0 01/30/2022   MCV 86.0 01/30/2022   PLT 39 (L) 01/30/2022   Recent Labs    06/30/21 1048 10/22/21 1454 01/28/22 0850 01/28/22 0851 01/29/22 0503 01/30/22 0622  NA 137 139  --  138 131* 138  K 3.7 4.1  --  3.9 3.3* 3.8  CL 102 108  --  105 101 107  CO2 28 24  --  22 21* 25  GLUCOSE 188* 206*  --  251* 299* 180*  BUN 17 18  --  _0 CREATININE 1.00 0.73  --  0.57* 0.58* 0.53*  CALCIUM 8.6* 9.1  --  9.1 8.3* 9.0  GFRNONAA >60 >60  --  >60 >60 >60  PROT 6.8 6.2* 6.6  --   --   --   ALBUMIN 3.9 3.6 4.0  --   --   --   AST 48* 37 50*  --   --   --   ALT 35 37 42  --   --   --   ALKPHOS 63 68 73  --   --   --   BILITOT 2.0* 2.3* 3.7*  --   --   --   BILIDIR  --   --  0.6*  --   --   --   IBILI  --   --  3.1*  --   --   --      ECHOCARDIOGRAM COMPLETE  Result Date: 01/29/2022    ECHOCARDIOGRAM REPORT   Patient Name:   AQUAN KOPE Date of Exam: 01/29/2022 Medical Rec #:  [id number]    Height:       74.0 in Accession #:    [id number]  Weight:       270.0 lb Date of Birth:  06-18-1960    BSA:          2.469 m Patient Age:    61 years     BP:           109/68 mmHg Patient Gender: M            HR:           77 bpm. Exam Location:  [facility] Procedure: 2D Echo, Color Doppler and Cardiac Doppler Indications:     I48.91 Atrial fibrillation  History:         Patient has no prior history of Echocardiogram examinations.                  Risk Factors:Hypertension, Diabetes, Dyslipidemia and Sleep                  Apnea.  Sonographer:     Charmayne Sheer Referring Phys:  [place] Diagnosing Phys: Neoma Laming  Sonographer Comments: Suboptimal  subcostal window. IMPRESSIONS  1. Left ventricular ejection fraction, by estimation, is 60 to 65%. The left ventricle has normal function. The left ventricle has no regional wall motion abnormalities. Left ventricular diastolic parameters were normal.  2. Right ventricular systolic function is normal. The right ventricular size is normal.  3. Left atrial size was mildly dilated.  4. Right atrial size was mildly dilated.  5. The mitral valve is normal in structure. Trivial mitral valve regurgitation. No evidence of mitral stenosis.  6. The aortic valve is normal in structure. Aortic valve regurgitation is not visualized. No aortic stenosis is present.  7. The inferior vena cava is normal in size with greater than 50% respiratory variability, suggesting right atrial pressure of 3 mmHg. FINDINGS  Left Ventricle: Left ventricular ejection fraction, by estimation, is 60 to 65%. The left ventricle has normal function. The left ventricle has no regional wall motion abnormalities. The left ventricular internal cavity size was normal in size. There is  no left ventricular hypertrophy. Left ventricular diastolic parameters were normal. Right Ventricle: The right ventricular size is normal. No increase in right ventricular wall thickness. Right ventricular systolic function is normal. Left Atrium: Left atrial size was mildly dilated. Right Atrium: Right atrial size was mildly dilated. Pericardium: There is no evidence of pericardial effusion. Mitral Valve: The mitral valve is normal in structure. Trivial mitral valve regurgitation. No evidence of mitral valve stenosis. Tricuspid Valve: The tricuspid valve is normal in structure. Tricuspid valve regurgitation is trivial. No evidence of tricuspid stenosis. Aortic Valve: The aortic valve is normal in structure. Aortic valve regurgitation is not visualized. No aortic stenosis is present. Aortic valve mean gradient measures 5.0 mmHg. Aortic valve peak gradient measures 9.0 mmHg.  Aortic valve area, by VTI measures 3.41 cm. Pulmonic Valve: The pulmonic valve was normal in structure. Pulmonic valve regurgitation is not visualized. No evidence of pulmonic stenosis. Aorta: The aortic root is normal in size and structure. Venous: The inferior vena cava is normal in size with greater than 50% respiratory variability, suggesting right atrial pressure of 3 mmHg. IAS/Shunts: No atrial level shunt detected by color flow Doppler.  LEFT VENTRICLE PLAX 2D LVIDd:         3.70 cm   Diastology LVIDs:         2.40 cm   LV e' medial:    10.00 cm/s LV PW:         1.30  cm   LV E/e' medial:  8.1 LV IVS:        0.80 cm   LV e' lateral:   11.60 cm/s LVOT diam:     2.20 cm   LV E/e' lateral: 7.0 LV SV:         100 LV SV Index:   40 LVOT Area:     3.80 cm  RIGHT VENTRICLE RV Basal diam:  3.40 cm RV S prime:     17.50 cm/s LEFT ATRIUM             Index        RIGHT ATRIUM           Index LA diam:        3.30 cm 1.34 cm/m   RA Area:     17.00 cm LA Vol (A2C):   43.8 ml 17.74 ml/m  RA Volume:   43.80 ml  17.74 ml/m LA Vol (A4C):   31.4 ml 12.72 ml/m LA Biplane Vol: 37.3 ml 15.11 ml/m  AORTIC VALVE                     PULMONIC VALVE AV Area (Vmax):    3.24 cm      PV Vmax:       1.25 m/s AV Area (Vmean):   3.16 cm      PV Vmean:      89.000 cm/s AV Area (VTI):     3.41 cm      PV VTI:        0.218 m AV Vmax:           150.00 cm/s   PV Peak grad:  6.2 mmHg AV Vmean:          108.000 cm/s  PV Mean grad:  4.0 mmHg AV VTI:            0.292 m AV Peak Grad:      9.0 mmHg AV Mean Grad:      5.0 mmHg LVOT Vmax:         128.00 cm/s LVOT Vmean:        89.700 cm/s LVOT VTI:          0.262 m LVOT/AV VTI ratio: 0.90  AORTA Ao Root diam: 3.80 cm MITRAL VALVE MV Area (PHT): 3.53 cm    SHUNTS MV Decel Time: 215 msec    Systemic VTI:  0.26 m MV E velocity: 81.20 cm/s  Systemic Diam: 2.20 cm MV A velocity: 85.40 cm/s MV E/A ratio:  0.95 Shaukat Khan Electronically signed by Neoma Laming Signature Date/Time:  01/29/2022/10:34:09 AM    Final    DG Chest Port 1 View  Result Date: 01/28/2022 CLINICAL DATA:  Lightheadedness EXAM: PORTABLE CHEST 1 VIEW COMPARISON:  None Available. FINDINGS: The heart size and mediastinal contours are within normal limits. Pulmonary vascular congestion. No focal consolidation. No pneumothorax or pleural effusion. IMPRESSION: Pulmonary vascular congestion.  No focal consolidation. Electronically Signed   By: Sammie Bench M.D.   On: 01/28/2022 09:18    ASSESSMENT & PLAN:   62 year old male patient with a history of cirrhosis/hypersplenism currently admitted to hospital for A-fib with RVR/noted to be hyperthyroid state.  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  Hemoglobin normal.  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  Patient gets screening for [facility] through [facility].  # A-fib with RVR [Dr.Khan]-secondary to hyper thyroidism-currently in sinus rhythm.  Not anticoagulated.  Recommendations:  # Agree with holding off anticoagulation given the platelets in the 30-40s.  Hopefully given the likely cause of A-fib being hyperthyroidism-patient likely did not need long-term anticoagulation.  Patient understands the increased risk of bleeding with anticoagulation with the blood thinners.   # Discussed with patient that is not unusual for the platelet counts to dip at times of "stress/illness".  However patient needs to be monitored closely in the next few weeks with repeat blood work.  If the patient's platelets/white count continues to drop I think would be reasonable to repeat a bone marrow biopsy for further evaluation.  Thank you Dr. Maryland Pink for allowing me to participate in the care of your pleasant patient. Please do not hesitate to contact me with questions or concerns in the interim.  I will also inform Dr. Janese Banks patient's primary hematologist.  All  questions were answered. The patient knows to call the clinic with any problems, questions or concerns.   Cammie Sickle, MD 02/01/2022 7:57 PM

## 2022-02-01 NOTE — Assessment & Plan Note (Signed)
Workup for atrial fibrillation revealed suppressed TSH and mildly elevated free T4.  Started on Tapazole.  Recommend repeat thyroid function studies in approximately several months or if PCP prefers, can refer to endocrinology.

## 2022-02-02 LAB — HEMOGLOBIN A1C
Hgb A1c MFr Bld: 8.4 % — ABNORMAL HIGH (ref 4.8–5.6)
Mean Plasma Glucose: 194 mg/dL

## 2022-02-02 LAB — T3, FREE: T3, Free: 5.9 pg/mL — ABNORMAL HIGH (ref 2.0–4.4)

## 2022-02-11 ENCOUNTER — Encounter: Payer: Self-pay | Admitting: Nurse Practitioner

## 2022-02-11 ENCOUNTER — Inpatient Hospital Stay: Payer: BC Managed Care – PPO

## 2022-02-11 ENCOUNTER — Inpatient Hospital Stay: Payer: BC Managed Care – PPO | Attending: Oncology | Admitting: Nurse Practitioner

## 2022-02-11 VITALS — BP 135/75 | HR 85 | Temp 96.8°F | Wt 261.0 lb

## 2022-02-11 DIAGNOSIS — K7460 Unspecified cirrhosis of liver: Secondary | ICD-10-CM | POA: Insufficient documentation

## 2022-02-11 DIAGNOSIS — D61818 Other pancytopenia: Secondary | ICD-10-CM | POA: Diagnosis not present

## 2022-02-11 DIAGNOSIS — D696 Thrombocytopenia, unspecified: Secondary | ICD-10-CM | POA: Diagnosis not present

## 2022-02-11 DIAGNOSIS — D72819 Decreased white blood cell count, unspecified: Secondary | ICD-10-CM | POA: Insufficient documentation

## 2022-02-11 DIAGNOSIS — D509 Iron deficiency anemia, unspecified: Secondary | ICD-10-CM | POA: Insufficient documentation

## 2022-02-11 LAB — COMPREHENSIVE METABOLIC PANEL
ALT: 48 U/L — ABNORMAL HIGH (ref 0–44)
AST: 48 U/L — ABNORMAL HIGH (ref 15–41)
Albumin: 3.8 g/dL (ref 3.5–5.0)
Alkaline Phosphatase: 78 U/L (ref 38–126)
Anion gap: 7 (ref 5–15)
BUN: 22 mg/dL — ABNORMAL HIGH (ref 6–20)
CO2: 25 mmol/L (ref 22–32)
Calcium: 8.8 mg/dL — ABNORMAL LOW (ref 8.9–10.3)
Chloride: 102 mmol/L (ref 98–111)
Creatinine, Ser: 0.61 mg/dL (ref 0.61–1.24)
GFR, Estimated: >60 mL/min (ref >60)
Glucose, Bld: 354 mg/dL — ABNORMAL HIGH (ref 70–99)
Potassium: 3.9 mmol/L (ref 3.5–5.1)
Sodium: 134 mmol/L — ABNORMAL LOW (ref 135–145)
Total Bilirubin: 2.7 mg/dL — ABNORMAL HIGH (ref 0.3–1.2)
Total Protein: 6.2 g/dL — ABNORMAL LOW (ref 6.5–8.1)

## 2022-02-11 LAB — CBC WITH DIFFERENTIAL/PLATELET
Abs Immature Granulocytes: 0.01 10*3/uL (ref 0.00–0.07)
Basophils Absolute: 0 10*3/uL (ref 0.0–0.1)
Basophils Relative: 1 %
Eosinophils Absolute: 0.1 10*3/uL (ref 0.0–0.5)
Eosinophils Relative: 2 %
HCT: 42.3 % (ref 39.0–52.0)
Hemoglobin: 15 g/dL (ref 13.0–17.0)
Immature Granulocytes: 0 %
Lymphocytes Relative: 30 %
Lymphs Abs: 1.2 10*3/uL (ref 0.7–4.0)
MCH: 30.1 pg (ref 26.0–34.0)
MCHC: 35.5 g/dL (ref 30.0–36.0)
MCV: 84.9 fL (ref 80.0–100.0)
Monocytes Absolute: 0.4 10*3/uL (ref 0.1–1.0)
Monocytes Relative: 11 %
Neutro Abs: 2.2 10*3/uL (ref 1.7–7.7)
Neutrophils Relative %: 56 %
Platelets: 52 10*3/uL — ABNORMAL LOW (ref 150–400)
RBC: 4.98 MIL/uL (ref 4.22–5.81)
RDW: 14.1 % (ref 11.5–15.5)
WBC: 3.9 10*3/uL — ABNORMAL LOW (ref 4.0–10.5)
nRBC: 0 % (ref 0.0–0.2)

## 2022-02-11 LAB — IRON AND TIBC
Iron: 138 ug/dL (ref 45–182)
Saturation Ratios: 41 % — ABNORMAL HIGH (ref 17.9–39.5)
TIBC: 337 ug/dL (ref 250–450)
UIBC: 199 ug/dL

## 2022-02-11 LAB — FERRITIN: Ferritin: 151 ng/mL (ref 24–336)

## 2022-02-11 NOTE — Progress Notes (Signed)
Patient admitted to hospital on 12/21 for heart palpitations. Per patient his lab worked showed plantlet count dropped while in hospital. Per patient has no energy, not able to walk long distance. Hips are also bothering him.

## 2022-02-11 NOTE — Progress Notes (Signed)
Hematology/Oncology Consult Note Juan Garza  Telephone:([phone] [phone]  Virtual Visit Progress Note  I connected with Juan Garza on 02/11/22 at  9:30 AM EST by video enabled telemedicine visit and verified that I am speaking with the correct person using two identifiers.   I discussed the limitations, risks, security and privacy concerns of performing an evaluation and management service by telemedicine and the availability of in-person appointments. I also discussed with the patient that there may be a patient responsible charge related to this service. The patient expressed understanding and agreed to proceed.   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 Mike Gip for leukopenia/thrombocytopenia.Bone marrow on 12/17/2019 revealed a hypercellular marrow with dysmegakaryocytopoiesis. The features were suggestive of a low-grade myelodysplastic syndrome. Cytogenetics were normal (46, XY).  Bone marrow review at [facility] did not reveal sufficient dysplasia to meet diagnostic criteria for a myelodysplastic syndrome.  The minimal dysplasia, leukopenia and thrombocytopenia in the marrow may be the result of reactive changes.  Early low-grade myelodysplastic syndrome could not be ruled out.  Abdomen and pelvis CT on 01/02/2020 revealed morphologic changes to the liver raising the  possibility of cirrhosis. There were sequelae of portal venous hypertension including splenomegaly (15.5 cm) and upper abdominal collateral vessels. There was cholelithiasis. There was mild gallbladder wall thickening which may be secondary to portal venous hypertension/cirrhosis.  Patient follows up with [facility] hepatology for his cirrhosis.   His leukopenia and thrombocytopenia has been attributed to liver cirrhosis.  He has a history of iron deficiency anemia in the past and has received IV iron.  Interval history- Patient is 62 year old male who with history of diabetes, cirrhosis, hypertension, leukopenia, and thrombocytopenia who presents to clinic for hospital follow up. He presented to ER on 12/22 with palpitations and lightheadness x 1 day. He was found to be in a fib with RVR, which was new diagnosis and was started on cardizem drip. Cardizem controlled his rate. Anticoagulation deferred d/t thrombocytopenia. Platelets were worse during hospitalization. During workup for a fib he was found to have hyperthyroidism. He was discharged to home. He saw Dr Rogue Bussing during hospitalization. He continues to feel tired and generally weak since hospitalization but has returned to work. Works third shift at [employer]. He has been checking his pulse at home and reports regular rhythm.   ECOG PS- 1 Pain scale- 0  Review of systems- Review of Systems  Constitutional:  Positive for malaise/fatigue. Negative for chills, fever and weight loss.  HENT:  Negative for congestion, ear discharge and nosebleeds.   Eyes:  Negative for blurred vision.  Respiratory:  Negative for cough, hemoptysis, sputum production, shortness of breath and wheezing.   Cardiovascular:  Negative for chest pain, palpitations, orthopnea and claudication.  Gastrointestinal:  Negative for abdominal pain, blood in stool, constipation, diarrhea, heartburn, melena, nausea and vomiting.  Genitourinary:  Negative  for dysuria, flank  pain, frequency, hematuria and urgency.  Musculoskeletal:  Negative for back pain, joint pain and myalgias.  Skin:  Negative for rash.  Neurological:  Negative for dizziness, tingling, focal weakness, seizures, weakness and headaches.  Endo/Heme/Allergies:  Does not bruise/bleed easily.  Psychiatric/Behavioral:  Negative for depression and suicidal ideas. The patient does not have insomnia.      Allergies  Allergen Reactions   Quetiapine Other (See Comments)    Uncontrolled leg movements Other reaction(s): Other (See Comments), Other (See Comments), Other (See Comments) Leg spasms "uncontrollable leg kicking" Uncontrolled leg movements Leg spasms "uncontrollable leg kicking"   Nsaids Other (See Comments)   Adhesive [Tape] Itching and Rash   Imitrex [Sumatriptan] Other (See Comments)    shaking   Seroquel [Quetiapine Fumarate] Other (See Comments)    Leg spasms    Past Medical History:  Diagnosis Date   Arthritis    Chronic back pain    Cirrhosis ([place])    Colon polyps    Diabetes mellitus without complication ([place])    GERD (gastroesophageal reflux disease)    GIB (gastrointestinal bleeding)    r/t NSAID use   Gout    Headache    Hyperlipidemia    Hypertension    Right-sided Bell's palsy    Sleep apnea     Past Surgical History:  Procedure Laterality Date   ABDOMINAL SURGERY     APPENDECTOMY     BACK SURGERY     posteriior lumbar spine fusion   CARDIAC CATHETERIZATION     colectomy partial w/anastoamosis     COLON SURGERY     COLONOSCOPY WITH PROPOFOL N/A 01/22/2020   Procedure: COLONOSCOPY WITH PROPOFOL;  Surgeon: Lesly Rubenstein, MD;  Location: [facility] ENDOSCOPY;  Service: Endoscopy;  Laterality: N/A;   ESOPHAGOGASTRODUODENOSCOPY N/A 01/29/2021   Procedure: ESOPHAGOGASTRODUODENOSCOPY (EGD);  Surgeon: Annamaria Helling, DO;  Location[facility] ENDOSCOPY;  Service: Gastroenterology;  Laterality: N/A;  DM   ESOPHAGOGASTRODUODENOSCOPY (EGD)  WITH PROPOFOL N/A 01/22/2020   Procedure: ESOPHAGOGASTRODUODENOSCOPY (EGD) WITH PROPOFOL;  Surgeon: Lesly Rubenstein, MD;  Location: [facility] ENDOSCOPY;  Service: Endoscopy;  Laterality: N/A;   HERNIA REPAIR     JOINT REPLACEMENT     KNEE SURGERY Left    NASAL SINUS SURGERY      Social History   Socioeconomic History   Marital status: Married    Spouse name: Not on file   Number of children: Not on file   Years of education: Not on file   Highest education level: Not on file  Occupational History   Not on file  Tobacco Use   Smoking status: Former   Smokeless tobacco: Never  Vaping Use   Vaping Use: Never used  Substance and Sexual Activity   Alcohol use: Never    Comment: none last 24hrs   Drug use: Never   Sexual activity: Not on file  Other Topics Concern   Not on file  Social History Narrative   Not on file   Social Determinants of Health   Financial Resource Strain: Not on file  Food Insecurity: No Food Insecurity (01/29/2022)   Hunger Vital Sign    Worried About Running Out of Food in the Last Year: Never true    Ran Out of Food in the Last Year: Never true  Transportation Needs: No Transportation Needs (01/29/2022)   PRAPARE - Hydrologist (Medical): No    Lack of Transportation (Non-Medical): No  Physical Activity: Not on file  Stress:  Not on file  Social Connections: Not on file  Intimate Partner Violence: Not At Risk (01/29/2022)   Humiliation, Afraid, Rape, and Kick questionnaire    Fear of Current or Ex-Partner: No    Emotionally Abused: No    Physically Abused: No    Sexually Abused: No    Family History  Problem Relation Age of Onset   Atrial fibrillation Mother    Diabetes Father     Current Outpatient Medications:    acetaminophen (TYLENOL) 500 MG tablet, Take 500-1,000 mg by mouth every 6 (six) hours as needed for mild pain or moderate pain., Disp: , Rfl:    ascorbic acid (VITAMIN C) 500 MG tablet, Take 1,000  mg by mouth daily., Disp: , Rfl:    atorvastatin (LIPITOR) 20 MG tablet, Take 20 mg by mouth daily., Disp: , Rfl:    carvedilol (COREG) 12.5 MG tablet, Take 0.5 tablets (6.25 mg total) by mouth 2 (two) times daily with a meal., Disp: 60 tablet, Rfl: 2   dutasteride (AVODART) 0.5 MG capsule, Take 0.5 mg by mouth daily., Disp: , Rfl:    empagliflozin (JARDIANCE) 25 MG TABS tablet, Take 25 mg by mouth daily., Disp: , Rfl:    furosemide (LASIX) 20 MG tablet, Take 20 mg by mouth daily as needed for fluid or edema., Disp: , Rfl:    gabapentin (NEURONTIN) 300 MG capsule, Take 300 mg by mouth 2 (two) times daily., Disp: , Rfl:    lisinopril (ZESTRIL) 10 MG tablet, Take 10 mg by mouth daily., Disp: , Rfl:    methimazole (TAPAZOLE) 10 MG tablet, Take 1 tablet (10 mg total) by mouth daily., Disp: 30 tablet, Rfl: 3   methocarbamol (ROBAXIN) 750 MG tablet, Take 750 mg by mouth every 8 (eight) hours as needed for muscle spasms., Disp: , Rfl:    ondansetron (ZOFRAN) 8 MG tablet, Take 8 mg by mouth every 8 (eight) hours as needed for nausea or vomiting., Disp: , Rfl:    pregabalin (LYRICA) 75 MG capsule, Take 75 mg by mouth See admin instructions. Take 1 capsule (82m) by mouth every morning and take 2 capsules (1570m by mouth every night at bedtime, Disp: , Rfl:    Semaglutide,0.25 or 0.5MG/DOS, (OZEMPIC, 0.25 OR 0.5 MG/DOSE,) 2 MG/1.5ML SOPN, Inject 0.5 mg into the skin once a week., Disp: , Rfl:   Physical exam:  Vitals:   02/11/22 0938  BP: 135/75  Pulse: 85  Temp: (!) 96.8 F (36 C)  TempSrc: Tympanic  Weight: 261 lb (118.4 kg)   Physical Exam Vitals reviewed.  Constitutional:      Appearance: He is not ill-appearing.  Cardiovascular:     Comments: MiSofie RowerRN palpated pulse as regular.  Pulmonary:     Effort: No respiratory distress.  Skin:    Coloration: Skin is not pale.  Neurological:     Mental Status: He is alert and oriented to person, place, and time.  Psychiatric:         Mood and Affect: Mood normal.        Behavior: Behavior normal.       Latest Ref Rng & Units 02/11/2022   10:21 AM  CMP  Glucose 70 - 99 mg/dL 354   BUN 6 - 20 mg/dL 22   Creatinine 0.61 - 1.24 mg/dL 0.61   Sodium 135 - 145 mmol/L 134   Potassium 3.5 - 5.1 mmol/L 3.9   Chloride 98 - 111 mmol/L 102   CO2 22 -  32 mmol/L 25   Calcium 8.9 - 10.3 mg/dL 8.8   Total Protein 6.5 - 8.1 g/dL 6.2   Total Bilirubin 0.3 - 1.2 mg/dL 2.7   Alkaline Phos 38 - 126 U/L 78   AST 15 - 41 U/L 48   ALT 0 - 44 U/L 48       Latest Ref Rng & Units 02/11/2022   10:21 AM  CBC  WBC 4.0 - 10.5 K/uL 3.9   Hemoglobin 13.0 - 17.0 g/dL 15.0   Hematocrit 39.0 - 52.0 % 42.3   Platelets 150 - 400 K/uL 52     Assessment and plan- Patient is a 62 y.o. male here for hospital follow up. He was admitted for new afib with RVR and found to be in hyperthyroid state.   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 Baseline between 3-4. WBC 3.9. Monitor.  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 Platelets have returned to baseline of 52. Reviewed that dip in counts during hospitalization can occur d/t illness/stress.  A fib with RVR- Managed by Dr. Khan/cardiology. Secondary to hyperthyroidism. Given that his counts are borderline, would continue to hold off on anticoagulation. Could reconsider if he has recurrent episodes or develops persistent a fib.  We reviewed rationale for anticoagulation as well as risks today in detail. Patient agrees to hold anticoagulation. Hopefully he will not need anticogulation with control of underlying thyroid condition. Now reportedly in sinus rhythm.  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 Followed by [facility]. He receives [employer] screenings through [facility] Iron Deficiency- now on oral iron. Ferritin improved to 151. Iron sat 41. TIBC 337. Hemoglobin has normalized to 15. Normocytic. Monitor. Continue iron every other day.    Disposition: Add on lab today 2 mo- lab (cbc,  cmp), Dr Janese Banks- [place]   Visit Diagnosis 1. Pancytopenia ([place])   2. Iron deficiency anemia, unspecified iron deficiency anemia type   3. Thrombocytopenia ([place])   4. Leukopenia, unspecified type    I discussed the assessment and treatment plan with the patient. The patient was provided an opportunity to ask questions and all were answered. The patient agreed with the plan and demonstrated an understanding of the instructions.   The patient was advised to call back or seek an in-person evaluation if the symptoms worsen or if the condition fails to improve as anticipated.   I spent 30 minutes face-to-face video visit time dedicated to the care of this patient on the date of this encounter to include pre-visit review of hospital notes, labs, face-to-face time with the patient, and post visit ordering of testing/documentation.   Beckey Rutter, DNP, AGNP-C, [place] [facility] [phone] (clinic) 02/11/2022

## 2022-02-17 ENCOUNTER — Encounter: Payer: Self-pay | Admitting: Hematology and Oncology

## 2022-03-05 ENCOUNTER — Other Ambulatory Visit: Payer: Self-pay

## 2022-03-05 DIAGNOSIS — D61818 Other pancytopenia: Secondary | ICD-10-CM

## 2022-03-08 ENCOUNTER — Inpatient Hospital Stay: Payer: BC Managed Care – PPO

## 2022-03-08 DIAGNOSIS — D61818 Other pancytopenia: Secondary | ICD-10-CM

## 2022-03-08 DIAGNOSIS — D696 Thrombocytopenia, unspecified: Secondary | ICD-10-CM | POA: Diagnosis not present

## 2022-03-08 DIAGNOSIS — D509 Iron deficiency anemia, unspecified: Secondary | ICD-10-CM

## 2022-03-08 LAB — CBC WITH DIFFERENTIAL/PLATELET
Abs Immature Granulocytes: 0.01 10*3/uL (ref 0.00–0.07)
Basophils Absolute: 0 10*3/uL (ref 0.0–0.1)
Basophils Relative: 1 %
Eosinophils Absolute: 0.1 10*3/uL (ref 0.0–0.5)
Eosinophils Relative: 2 %
HCT: 45 % (ref 39.0–52.0)
Hemoglobin: 15.5 g/dL (ref 13.0–17.0)
Immature Granulocytes: 0 %
Lymphocytes Relative: 25 %
Lymphs Abs: 0.9 10*3/uL (ref 0.7–4.0)
MCH: 30 pg (ref 26.0–34.0)
MCHC: 34.4 g/dL (ref 30.0–36.0)
MCV: 87 fL (ref 80.0–100.0)
Monocytes Absolute: 0.2 10*3/uL (ref 0.1–1.0)
Monocytes Relative: 6 %
Neutro Abs: 2.5 10*3/uL (ref 1.7–7.7)
Neutrophils Relative %: 66 %
Platelets: 53 10*3/uL — ABNORMAL LOW (ref 150–400)
RBC: 5.17 MIL/uL (ref 4.22–5.81)
RDW: 15.2 % (ref 11.5–15.5)
WBC: 3.8 10*3/uL — ABNORMAL LOW (ref 4.0–10.5)
nRBC: 0 % (ref 0.0–0.2)

## 2022-03-08 LAB — IRON AND TIBC
Iron: 104 ug/dL (ref 45–182)
Saturation Ratios: 27 % (ref 17.9–39.5)
TIBC: 384 ug/dL (ref 250–450)
UIBC: 280 ug/dL

## 2022-03-08 LAB — FERRITIN: Ferritin: 62 ng/mL (ref 24–336)

## 2022-03-16 ENCOUNTER — Other Ambulatory Visit: Payer: Self-pay

## 2022-03-16 DIAGNOSIS — I4891 Unspecified atrial fibrillation: Secondary | ICD-10-CM

## 2022-03-22 ENCOUNTER — Ambulatory Visit
Admission: RE | Admit: 2022-03-22 | Discharge: 2022-03-22 | Disposition: A | Discharge status: Home / Self Care | Payer: BC Managed Care – PPO | Source: Ambulatory Visit | Attending: Internal Medicine | Admitting: Internal Medicine

## 2022-03-22 DIAGNOSIS — I4891 Unspecified atrial fibrillation: Secondary | ICD-10-CM | POA: Insufficient documentation

## 2022-03-25 ENCOUNTER — Encounter: Payer: Self-pay | Admitting: Oncology

## 2022-04-01 ENCOUNTER — Other Ambulatory Visit: Payer: Self-pay | Admitting: *Deleted

## 2022-04-02 ENCOUNTER — Telehealth: Payer: Self-pay | Admitting: *Deleted

## 2022-04-09 ENCOUNTER — Encounter: Payer: Self-pay | Admitting: *Deleted

## 2022-04-12 ENCOUNTER — Inpatient Hospital Stay: Payer: BC Managed Care – PPO | Attending: Oncology | Admitting: Oncology

## 2022-04-12 ENCOUNTER — Inpatient Hospital Stay: Payer: BC Managed Care – PPO

## 2022-04-12 ENCOUNTER — Encounter: Payer: Self-pay | Admitting: Oncology

## 2022-04-12 VITALS — BP 111/70 | HR 69 | Temp 97.1°F | Resp 18 | Ht 75.0 in | Wt 262.1 lb

## 2022-04-12 DIAGNOSIS — D61818 Other pancytopenia: Secondary | ICD-10-CM | POA: Insufficient documentation

## 2022-04-12 DIAGNOSIS — K7460 Unspecified cirrhosis of liver: Secondary | ICD-10-CM | POA: Diagnosis not present

## 2022-04-12 NOTE — Progress Notes (Signed)
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 Mike Gip for pancytopenia.Bone marrow on 12/17/2019 revealed a hypercellular marrow with dysmegakaryocytopoiesis. The features were suggestive of a low-grade myelodysplastic syndrome. Cytogenetics were normal (46, XY).  Bone marrow review at [facility] did not reveal sufficient dysplasia to meet diagnostic criteria for a myelodysplastic syndrome.  The minimal dysplasia, leukopenia and thrombocytopenia in the marrow may be the result of reactive changes.  Early low-grade myelodysplastic syndrome could not be ruled out.  Abdomen and pelvis CT on 01/02/2020 revealed morphologic changes to the liver raising the possibility of cirrhosis. There were sequelae of portal venous hypertension including splenomegaly (15.5 cm) and upper abdominal collateral vessels. There was cholelithiasis. There was mild gallbladder wall thickening which may be secondary to portal venous hypertension/cirrhosis.  Patient follows up with [facility] and [facility] hepatology for his cirrhosis.   Interval history-he is doing well from cirrhosis standpoint.  He has not had any episodes of decompensation or ascites.  He has chronic fatigue.  Denies any blood loss in his stool or urine.  He has EGD coming up with [facility] in June 2024.  ECOG PS- 1 Pain scale- 0   Review of systems- Review of Systems  Constitutional:  Positive for malaise/fatigue. Negative for chills, fever and weight loss.  HENT:   Negative for congestion, ear discharge and nosebleeds.   Eyes:  Negative for blurred vision.  Respiratory:  Negative for cough, hemoptysis, sputum production, shortness of breath and wheezing.   Cardiovascular:  Negative for chest pain, palpitations, orthopnea and claudication.  Gastrointestinal:  Negative for abdominal pain, blood in stool, constipation, diarrhea, heartburn, melena, nausea and vomiting.  Genitourinary:  Negative for dysuria, flank pain, frequency, hematuria and urgency.  Musculoskeletal:  Negative for back pain, joint pain and myalgias.  Skin:  Negative for rash.  Neurological:  Negative for dizziness, tingling, focal weakness, seizures, weakness and headaches.  Endo/Heme/Allergies:  Does not bruise/bleed easily.  Psychiatric/Behavioral:  Negative for depression and suicidal ideas. The patient does not have insomnia.       Allergies  Allergen Reactions   Quetiapine Other (See Comments)    Uncontrolled leg movements Other reaction(s): Other (See Comments), Other (See Comments), Other (See Comments) Leg spasms "uncontrollable leg kicking" Uncontrolled leg movements Leg spasms "uncontrollable leg kicking"   Nsaids Other (See Comments)   Adhesive [Tape] Itching and Rash   Imitrex [Sumatriptan] Other (See Comments)    shaking   Seroquel [Quetiapine Fumarate] Other (See Comments)    Leg spasms     Past Medical History:  Diagnosis Date   Arthritis    Chronic back pain    Cirrhosis ([place])    Colon polyps    Diabetes mellitus without complication ([place])    GERD (gastroesophageal reflux disease)    GIB (gastrointestinal bleeding)    r/t NSAID use   Gout    Headache    Hyperlipidemia    Hypertension    Right-sided Bell's palsy    Sleep apnea      Past Surgical History:  Procedure Laterality Date  ABDOMINAL SURGERY     APPENDECTOMY     BACK SURGERY     posteriior lumbar spine fusion   CARDIAC CATHETERIZATION     colectomy partial w/anastoamosis      COLON SURGERY     COLONOSCOPY WITH PROPOFOL N/A 01/22/2020   Procedure: COLONOSCOPY WITH PROPOFOL;  Surgeon: Lesly Rubenstein, MD;  Location: [facility] ENDOSCOPY;  Service: Endoscopy;  Laterality: N/A;   ESOPHAGOGASTRODUODENOSCOPY N/A 01/29/2021   Procedure: ESOPHAGOGASTRODUODENOSCOPY (EGD);  Surgeon: Annamaria Helling, DO;  Location: [facility] ENDOSCOPY;  Service: Gastroenterology;  Laterality: N/A;  DM   ESOPHAGOGASTRODUODENOSCOPY (EGD) WITH PROPOFOL N/A 01/22/2020   Procedure: ESOPHAGOGASTRODUODENOSCOPY (EGD) WITH PROPOFOL;  Surgeon: Lesly Rubenstein, MD;  Location: [facility] ENDOSCOPY;  Service: Endoscopy;  Laterality: N/A;   HERNIA REPAIR     JOINT REPLACEMENT     KNEE SURGERY Left    NASAL SINUS SURGERY      Social History   Socioeconomic History   Marital status: Married    Spouse name: Not on file   Number of children: Not on file   Years of education: Not on file   Highest education level: Not on file  Occupational History   Not on file  Tobacco Use   Smoking status: Former   Smokeless tobacco: Never  Vaping Use   Vaping Use: Never used  Substance and Sexual Activity   Alcohol use: Never    Comment: none last 24hrs   Drug use: Never   Sexual activity: Not on file  Other Topics Concern   Not on file  Social History Narrative   Works third shift ar Consulting civil engineer.    Social Determinants of Health   Financial Resource Strain: Not on file  Food Insecurity: No Food Insecurity (01/29/2022)   Hunger Vital Sign    Worried About Running Out of Food in the Last Year: Never true    Ran Out of Food in the Last Year: Never true  Transportation Needs: No Transportation Needs (01/29/2022)   PRAPARE - Hydrologist (Medical): No    Lack of Transportation (Non-Medical): No  Physical Activity: Not on file  Stress: Not on file  Social Connections: Not on file  Intimate Partner Violence: Not At Risk (01/29/2022)    Humiliation, Afraid, Rape, and Kick questionnaire    Fear of Current or Ex-Partner: No    Emotionally Abused: No    Physically Abused: No    Sexually Abused: No    Family History  Problem Relation Age of Onset   Atrial fibrillation Mother    Diabetes Father      Current Outpatient Medications:    acetaminophen (TYLENOL) 500 MG tablet, Take 500-1,000 mg by mouth every 6 (six) hours as needed for mild pain or moderate pain., Disp: , Rfl:    ascorbic acid (VITAMIN C) 500 MG tablet, Take 1,000 mg by mouth daily., Disp: , Rfl:    atorvastatin (LIPITOR) 20 MG tablet, Take 20 mg by mouth daily., Disp: , Rfl:    carvedilol (COREG) 12.5 MG tablet, Take 0.5 tablets (6.25 mg total) by mouth 2 (two) times daily with a meal., Disp: 60 tablet, Rfl: 2   carvedilol (COREG) 12.5 MG tablet, Take by mouth., Disp: , Rfl:    dutasteride (AVODART) 0.5 MG capsule, Take 0.5 mg by mouth daily., Disp: , Rfl:    empagliflozin (JARDIANCE) 25 MG TABS tablet, Take 25 mg by mouth daily., Disp: , Rfl:  furosemide (LASIX) 20 MG tablet, Take 20 mg by mouth daily as needed for fluid or edema., Disp: , Rfl:    gabapentin (NEURONTIN) 300 MG capsule, Take 300 mg by mouth 2 (two) times daily., Disp: , Rfl:    lisinopril (ZESTRIL) 10 MG tablet, Take 10 mg by mouth daily., Disp: , Rfl:    methimazole (TAPAZOLE) 10 MG tablet, Take 1 tablet (10 mg total) by mouth daily., Disp: 30 tablet, Rfl: 3   methocarbamol (ROBAXIN) 750 MG tablet, Take 750 mg by mouth every 8 (eight) hours as needed for muscle spasms., Disp: , Rfl:    ondansetron (ZOFRAN) 8 MG tablet, Take 8 mg by mouth every 8 (eight) hours as needed for nausea or vomiting., Disp: , Rfl:    pregabalin (LYRICA) 75 MG capsule, Take 75 mg by mouth See admin instructions. Take 1 capsule ('75mg'$ ) by mouth every morning and take 2 capsules ('150mg'$ ) by mouth every night at bedtime, Disp: , Rfl:    Semaglutide,0.25 or 0.'5MG'$ /DOS, (OZEMPIC, 0.25 OR 0.5 MG/DOSE,) 2 MG/1.5ML SOPN, Inject  0.5 mg into the skin once a week., Disp: , Rfl:   Physical exam:  Vitals:   04/12/22 0925  BP: 111/70  Pulse: 69  Resp: 18  Temp: (!) 97.1 F (36.2 C)  TempSrc: Tympanic  SpO2: 100%  Weight: 262 lb 1.6 oz (118.9 kg)  Height: '6\' 3"'$  (1.905 m)   Physical Exam Constitutional:      General: He is not in acute distress. Cardiovascular:     Rate and Rhythm: Normal rate and regular rhythm.     Heart sounds: Normal heart sounds.  Pulmonary:     Effort: Pulmonary effort is normal.     Breath sounds: Normal breath sounds.  Abdominal:     General: Bowel sounds are normal. There is no distension.     Palpations: Abdomen is soft.     Tenderness: There is no abdominal tenderness.  Skin:    General: Skin is warm and dry.  Neurological:     Mental Status: He is alert and oriented to person, place, and time.         Latest Ref Rng & Units 02/11/2022   10:21 AM  CMP  Glucose 70 - 99 mg/dL 354   BUN 6 - 20 mg/dL 22   Creatinine 0.61 - 1.24 mg/dL 0.61   Sodium 135 - 145 mmol/L 134   Potassium 3.5 - 5.1 mmol/L 3.9   Chloride 98 - 111 mmol/L 102   CO2 22 - 32 mmol/L 25   Calcium 8.9 - 10.3 mg/dL 8.8   Total Protein 6.5 - 8.1 g/dL 6.2   Total Bilirubin 0.3 - 1.2 mg/dL 2.7   Alkaline Phos 38 - 126 U/L 78   AST 15 - 41 U/L 48   ALT 0 - 44 U/L 48       Latest Ref Rng & Units 03/08/2022    1:54 PM  CBC  WBC 4.0 - 10.5 K/uL 3.8   Hemoglobin 13.0 - 17.0 g/dL 15.5   Hematocrit 39.0 - 52.0 % 45.0   Platelets 150 - 400 K/uL 53     No images are attached to the encounter.  CT CARDIAC SCORING (SELF PAY ONLY)  Addendum Date: 03/24/2022   ADDENDUM REPORT: 03/24/2022 06:54 EXAM: OVER-READ INTERPRETATION  CT CHEST The following report is an over-read performed by radiologist Dr. Rebekah Chesterfield [facility] on 03/24/2022. This over-read does not include interpretation of cardiac or coronary anatomy or  pathology. The coronary calcium score interpretation by the cardiologist is  attached. COMPARISON:  None. FINDINGS: Within the visualized portions of the thorax there are no suspicious appearing pulmonary nodules or masses, there is no acute consolidative airspace disease, no pleural effusions, no pneumothorax and no lymphadenopathy. Irregular thickening of the distal third of the esophagus is noted. Visualized portions of the upper abdomen are unremarkable. There are no aggressive appearing lytic or blastic lesions noted in the visualized portions of the skeleton. IMPRESSION: 1. Irregular thickening of the distal third of the esophagus, somewhat nodular in appearance. Further evaluation with nonemergent endoscopy should be considered in the near future to exclude the possibility of esophageal polyp or neoplasm. These results will be called to the ordering clinician or representative by the Radiologist Assistant, and communication documented in the PACS or [employer]. Electronically Signed   By: Vinnie Langton M.D.   On: 03/24/2022 06:54   Result Date: 03/24/2022 CLINICAL DATA:  Risk stratification EXAM: Coronary Calcium Score TECHNIQUE: The patient was scanned on a Siemens Somatom go.Top Scanner. Axial non-contrast 3 mm slices were carried out through the heart. The data set was analyzed on a dedicated work station and scored using the [place]. FINDINGS: Non-cardiac: See separate report from [facility]. Ascending Aorta: Normal size Pericardium: Normal Coronary arteries: Normal origin of left and right coronary arteries. Distribution of arterial calcifications if present, as noted below; LM 0 LAD 0 LCx 0 RCA 0 Total 0 IMPRESSION AND RECOMMENDATION: 1. Normal coronary calcium score of 0. Patient is low risk. 2. CAC 0, CAC-DRS A0. 3. Continue heart healthy lifestyle and risk factor modification. Electronically Signed: By: Kate Sable M.D. On: 03/22/2022 16:47     Assessment and plan- Patient is a 62 y.o. male here for routine follow-up of  pancytopenia  Patient has had baseline leukopenia/neutropenia and mild thrombocytopenia which has remained stable over the last 3 years.  Platelets are presently between 40s to 50s over the last 1 year.  White cell count fluctuates between 2-4.  Hemoglobin has remained normal.  I suspect these changes are secondary to underlying cirrhosis.  He has evidence of splenomegaly and portal hypertension all of which can contribute to manic sequestration resulting in leukopenia and thrombocytopenia.  Given the stability in his counts I will plan to monitor these levels every 6 months and see him back in 1 year.  [place] surveillance: He follows up with both [facility] and [facility] and has been getting ultrasounds every 6 months.  I will check AFP every 6 months when he comes to get his CBC checked here.   Visit Diagnosis 1. Pancytopenia ([place])   2. Cirrhosis of liver without ascites, unspecified hepatic cirrhosis type ([place])      Dr. Randa Evens, MD, MPH [facility] [id number] 04/12/2022 12:42 PM

## 2022-04-12 NOTE — Progress Notes (Signed)
Patient states that he has had a lot of hip pain.

## 2022-05-04 ENCOUNTER — Ambulatory Visit: Admit: 2022-05-04 | Discharge: 2022-05-04 | Payer: PRIVATE HEALTH INSURANCE

## 2022-05-04 DIAGNOSIS — K7460 Unspecified cirrhosis of liver: Principal | ICD-10-CM

## 2022-06-07 ENCOUNTER — Other Ambulatory Visit: Payer: Self-pay

## 2022-06-07 ENCOUNTER — Inpatient Hospital Stay
Admission: RE | Admit: 2022-06-07 | Discharge: 2022-06-07 | Disposition: A | Discharge status: Home / Self Care | Payer: Self-pay | Source: Ambulatory Visit | Attending: Neurosurgery | Admitting: Neurosurgery

## 2022-06-07 DIAGNOSIS — Z049 Encounter for examination and observation for unspecified reason: Secondary | ICD-10-CM

## 2022-06-17 ENCOUNTER — Ambulatory Visit
Admission: RE | Admit: 2022-06-17 | Discharge: 2022-06-17 | Disposition: A | Discharge status: Home / Self Care | Payer: BC Managed Care – PPO | Source: Ambulatory Visit | Attending: Neurosurgery | Admitting: Neurosurgery

## 2022-06-17 ENCOUNTER — Ambulatory Visit
Admission: RE | Admit: 2022-06-17 | Discharge: 2022-06-17 | Disposition: A | Discharge status: Home / Self Care | Payer: BC Managed Care – PPO | Attending: Neurosurgery | Admitting: Neurosurgery

## 2022-06-17 ENCOUNTER — Encounter: Payer: Self-pay | Admitting: Neurosurgery

## 2022-06-17 ENCOUNTER — Ambulatory Visit (INDEPENDENT_AMBULATORY_CARE_PROVIDER_SITE_OTHER): Payer: BC Managed Care – PPO | Admitting: Neurosurgery

## 2022-06-17 VITALS — BP 141/79 | HR 65 | Ht 75.0 in | Wt 261.0 lb

## 2022-06-17 DIAGNOSIS — M5416 Radiculopathy, lumbar region: Secondary | ICD-10-CM

## 2022-06-17 DIAGNOSIS — Z981 Arthrodesis status: Secondary | ICD-10-CM | POA: Diagnosis present

## 2022-06-17 NOTE — Progress Notes (Signed)
Referring Physician:  Myrene Buddy, NP [address] [place],  [place] [address]  Primary Physician:  Myrene Buddy, NP  History of Present Illness: 06/17/2022 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  He states this is started over the last couple of months without any particular inciting event and is significantly impacting his quality of life.  He describes the hip pain as dull and achy and is worse with standing for prolonged periods of time.  The leg pain is sharp in nature.  It improves with laying flat.  He also endorses some pain that radiates intermittently down his left leg however this is largely infrequent.  He admits to a lumbar decompression surgery in 2021.  He had left thigh weakness preoperatively which did improve some postoperatively however he still has significant weakness in the left leg.  He states that he had his hip evaluated by his primary care provider with x-rays and was told it is most likely his back prompting referral to our office.  Conservative measures:  Physical therapy: has not participated; has tried home exercises  Multimodal medical therapy including regular antiinflammatories: lyrica, gabapentin, robaxin, tylenol  Injections: has not received epidural steroid injections since 2012  Past Surgery:  Lumbar fusion in 2012 Lumbar fusion in 2017 Lumbar discectomy in 2021  Sarina Ill has no symptoms of cervical myelopathy.  The symptoms are causing a significant impact on the patient's life.   Review of Systems:  A 10 point review of systems is negative, except for the pertinent positives and negatives detailed in the HPI.  Past Medical History: Past Medical History:   Diagnosis Date   Arthritis    Chronic back pain    Cirrhosis ([facility])    Colon polyps    Diabetes mellitus without complication ([facility])    GERD (gastroesophageal reflux disease)    GIB (gastrointestinal bleeding)    r/t NSAID use   Gout    Headache    Hyperlipidemia    Hypertension    Right-sided Bell's palsy    Sleep apnea     Past Surgical History: Past Surgical History:  Procedure Laterality Date   ABDOMINAL SURGERY     APPENDECTOMY     BACK SURGERY     posteriior lumbar spine fusion   CARDIAC CATHETERIZATION     colectomy partial w/anastoamosis     COLON SURGERY     COLONOSCOPY WITH PROPOFOL N/A 01/22/2020   Procedure: COLONOSCOPY WITH PROPOFOL;  Surgeon: Regis Bill, MD;  Location: [facility] ENDOSCOPY;  Service: Endoscopy;  Laterality: N/A;   ESOPHAGOGASTRODUODENOSCOPY N/A 01/29/2021   Procedure: ESOPHAGOGASTRODUODENOSCOPY (EGD);  Surgeon: Jaynie Collins, DO;  Location: [facility] ENDOSCOPY;  Service: Gastroenterology;  Laterality: N/A;  DM   ESOPHAGOGASTRODUODENOSCOPY (EGD) WITH PROPOFOL N/A 01/22/2020   Procedure: ESOPHAGOGASTRODUODENOSCOPY (EGD) WITH PROPOFOL;  Surgeon: Regis Bill, MD;  Location: [facility] ENDOSCOPY;  Service: Endoscopy;  Laterality: N/A;   HERNIA REPAIR     JOINT REPLACEMENT     KNEE SURGERY Left    NASAL SINUS SURGERY      Allergies: Allergies as of 06/17/2022 - Review Complete 06/17/2022  Allergen Reaction Noted   Quetiapine Other (See Comments) 12/16/2010   Sumatriptan Other (See Comments) and Anxiety 12/16/2010  Nsaids Other (See Comments) 01/28/2021   Seroquel [quetiapine fumarate] Other (See Comments) 10/15/2018   Tape Itching, Rash, and Other (See Comments) 10/15/2018    Medications: Outpatient Encounter Medications as of 06/17/2022  Medication Sig   acetaminophen (TYLENOL) 500 MG tablet Take 500-1,000 mg by mouth every 6 (six) hours as needed for mild pain or moderate pain.   ascorbic acid (VITAMIN C) 500 MG tablet Take 1,000  mg by mouth daily.   atorvastatin (LIPITOR) 20 MG tablet Take 20 mg by mouth daily.   carvedilol (COREG) 12.5 MG tablet Take 0.5 tablets (6.25 mg total) by mouth 2 (two) times daily with a meal.   carvedilol (COREG) 12.5 MG tablet Take by mouth.   dutasteride (AVODART) 0.5 MG capsule Take 0.5 mg by mouth daily.   empagliflozin (JARDIANCE) 25 MG TABS tablet Take 25 mg by mouth daily.   furosemide (LASIX) 20 MG tablet Take 20 mg by mouth daily as needed for fluid or edema.   gabapentin (NEURONTIN) 300 MG capsule Take 300 mg by mouth 2 (two) times daily.   lisinopril (ZESTRIL) 10 MG tablet Take 10 mg by mouth daily.   methimazole (TAPAZOLE) 10 MG tablet Take 1 tablet (10 mg total) by mouth daily.   methocarbamol (ROBAXIN) 750 MG tablet Take 750 mg by mouth every 8 (eight) hours as needed for muscle spasms.   ondansetron (ZOFRAN) 8 MG tablet Take 8 mg by mouth every 8 (eight) hours as needed for nausea or vomiting.   pregabalin (LYRICA) 75 MG capsule Take 75 mg by mouth See admin instructions. Take 1 capsule (75mg ) by mouth every morning and take 2 capsules (150mg ) by mouth every night at bedtime   Semaglutide,0.25 or 0.5MG /DOS, (OZEMPIC, 0.25 OR 0.5 MG/DOSE,) 2 MG/1.5ML SOPN Inject 0.5 mg into the skin once a week.   No facility-administered encounter medications on file as of 06/17/2022.    Social History: Social History   Tobacco Use   Smoking status: Former   Smokeless tobacco: Never  Building services engineer Use: Never used  Substance Use Topics   Alcohol use: Never    Comment: none last 24hrs   Drug use: Never    Family Medical History: Family History  Problem Relation Age of Onset   Atrial fibrillation Mother    Diabetes Father     Physical Examination: Today's Vitals   06/17/22 1052  BP: (!) 141/79  Pulse: 65  Weight: 118.4 kg  Height: 6\' 3"  (1.905 m)  PainSc: 6   PainLoc: Hip   Body mass index is 32.62 kg/m.;  General: Patient is well developed, well nourished, calm,  collected, and in no apparent distress. Attention to examination is appropriate.  Psychiatric: Patient is non-anxious.  Head:  Pupils equal, round, and reactive to light.  ENT:  Oral mucosa appears well hydrated.  Neck:   Supple.  Full range of motion.  Respiratory: Patient is breathing without any difficulty.  Extremities: No edema.  Vascular: Palpable dorsal pedal pulses.  Skin:   On exposed skin, there are no abnormal skin lesions.  NEUROLOGICAL:     Awake, alert, oriented to person, place, and time.  Speech is clear and fluent. Fund of knowledge is appropriate.   Cranial Nerves: Pupils equal round and reactive to light.  Facial tone is symmetric.  Facial sensation is symmetric.  ROM of spine: full.  Palpation of spine: non tender.    Strength: Side Biceps Triceps Deltoid Interossei Grip Wrist Ext. Wrist Flex.  R  5 5 5 5 5 5 5   L 5 5 5 5 5 5 5    Side Iliopsoas Quads Hamstring PF DF EHL  R 5 5 5 5 5 5   L 3 5 5 5 5 5    Reflexes are 1+ and symmetric at the biceps, triceps, brachioradialis, patella and achilles.   Hoffman's is absent.  Clonus is not present.  Toes are down-going.  Bilateral upper and lower extremity sensation is intact to light touch.    Ambulates with an antalgic gait Medical Decision Making  Imaging: Patient is I have any recent imaging of his spine.  Assessment and Plan: Mr. Shumard is a pleasant 62 y.o. male with multiple comorbidities presenting with an exacerbation of low back and bilateral buttock and thigh pain.  He does localize his symptoms to his lateral hips and does not have any persistent or consistent radiating leg pain.  He admits that this is different than his preoperative leg pain as well.  Given his significant lumbar history, I would like to obtain an updated lumbar MRI and lumbar x-rays to evaluate his hardware construct.  If these are largely negative, I would likely refer him to orthopedics for further evaluation of his hips given the  localization of his symptoms.  In the meantime I will place a referral to physical therapy he has requested to go to [facility] and [place].  I will contact him with results of his imaging via telephone visit once completed.  He was encouraged to call the office in the interim should he have any questions or concerns.    Thank you for involving me in the care of this patient.   I spent a total of 35 minutes in both face-to-face and non-face-to-face activities for this visit on the date of this encounter including review of outside records, review of updated imaging, significant review of surgical history, discussion of symptoms, discussion of plan of care, order placement, and documentation.   [facility]

## 2022-06-28 ENCOUNTER — Ambulatory Visit
Admission: RE | Admit: 2022-06-28 | Discharge: 2022-06-28 | Disposition: A | Discharge status: Home / Self Care | Payer: BC Managed Care – PPO | Source: Ambulatory Visit | Attending: Neurosurgery | Admitting: Neurosurgery

## 2022-06-28 DIAGNOSIS — Z981 Arthrodesis status: Secondary | ICD-10-CM | POA: Diagnosis present

## 2022-06-28 DIAGNOSIS — M5416 Radiculopathy, lumbar region: Secondary | ICD-10-CM | POA: Diagnosis present

## 2022-07-02 ENCOUNTER — Other Ambulatory Visit: Payer: BC Managed Care – PPO

## 2022-07-02 ENCOUNTER — Ambulatory Visit: Payer: BC Managed Care – PPO | Admitting: Oncology

## 2022-07-08 NOTE — Progress Notes (Signed)
Neurosurgery Telephone (Audio-Only) Note  Requesting Provider     Gauger, Hermenia Fiscal, NP [address] [place],  [place] [address] T: [phone] F: [phone]  Primary Care Provider Gauger, Hermenia Fiscal, NP [address] [place] [address] T: [phone] F: [phone]  Telehealth visit was conducted with Juan Garza, a 62 y.o. male via telephone.  History of Present Illness: Juan Garza is a 62 year old male presenting today via telephone visit to review his MRI results.  His symptoms are largely unchanged since his last visit and are affecting his quality of life.  He is scheduled to start physical therapy in [place] next week.  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  He states this is started over the last couple of months without any particular inciting event and is significantly impacting his quality of life.  He describes the hip pain as dull and achy and is worse with standing for prolonged periods of time.  The leg pain is sharp in nature.  It improves with laying flat.  He also endorses some pain that radiates intermittently down his left leg however this is largely infrequent.  He admits to a lumbar decompression surgery in 2021.  He had left thigh weakness preoperatively which did improve some postoperatively however he still has significant weakness in the left leg.  He states that he had his hip evaluated by his primary care provider with x-rays and was told it is most likely his back prompting referral to our office.   Conservative measures:  Physical therapy: has not participated; has tried home exercises  Multimodal medical therapy including regular antiinflammatories: lyrica,  gabapentin, robaxin, tylenol  Injections: has not received epidural steroid injections since 2012   Past Surgery:  Lumbar fusion in 2012 Lumbar fusion in 2017 Lumbar discectomy in 2021   Juan Garza has no symptoms of cervical myelopathy.  General Review of Systems:  A ROS was performed including pertinent positive and negatives as documented.  All other systems are negative.   Prior to Admission medications   Medication Sig Start Date End Date Taking? Authorizing Provider  acetaminophen (TYLENOL) 500 MG tablet Take 500-1,000 mg by mouth every 6 (six) hours as needed for mild pain or moderate pain.    [provider]  atorvastatin (LIPITOR) 20 MG tablet Take 20 mg by mouth daily.    [provider]  carvedilol (COREG) 12.5 MG tablet Take 12.5 mg by mouth 2 (two) times daily with a meal. 02/09/22   [provider]  dutasteride (AVODART) 0.5 MG capsule Take 0.5 mg by mouth daily.    [provider]  empagliflozin (JARDIANCE) 25 MG TABS tablet Take 25 mg by mouth daily.    [provider]  furosemide (LASIX) 20 MG tablet Take 20 mg by mouth daily as needed for fluid or edema.    [provider]  lisinopril (ZESTRIL) 10 MG tablet Take 10 mg by mouth daily.    [provider]  methimazole (TAPAZOLE) 5 MG tablet Take 5 mg by mouth daily.    [provider]  methocarbamol (ROBAXIN) 750 MG tablet Take 750 mg by mouth every 8 (eight) hours as needed for muscle spasms.    [provider]  omeprazole (PRILOSEC) 20 MG capsule Take 20 mg by mouth 2 (two) times daily before a meal.    [provider]  ondansetron (ZOFRAN) 8 MG tablet Take 8 mg by mouth every 8 (eight) hours as needed for nausea or vomiting.    [provider]  pioglitazone (ACTOS) 15 MG tablet Take 15 mg by mouth daily.    [provider]  pregabalin (LYRICA) 75 MG capsule Take 75 mg by mouth See admin instructions. Take 1 capsule  (75mg ) by mouth every morning and take 2 capsules (150mg ) by mouth every night at bedtime    [provider]  Semaglutide,0.25 or 0.5MG /DOS, (OZEMPIC, 0.25 OR 0.5 MG/DOSE,) 2 MG/1.5ML SOPN Inject 0.5 mg into the skin once a week.    [provider]    DATA REVIEWED    Imaging Studies  MRI L spine 06/28/22 FINDINGS: Segmentation:  Standard.   Alignment:  Mild retrolisthesis at L2-3 which measures 4 mm.   Vertebrae: No fracture, evidence of discitis, or bone lesion. L3-L5 fusion that is solid.   Conus medullaris and cauda equina: Conus extends to the L1-2 level. Conus and cauda equina appear normal.   Paraspinal and other soft tissues: Postoperative scarring and fatty muscular atrophy.   Disc levels:   T12- L1: Unremarkable.   L1-L2: Unremarkable.   L2-L3: Disc collapse and endplate degeneration with retrolisthesis. No recurrence of the left subarticular recess extrusion on prior. Degenerative facet spurring asymmetric to the left where there is moderate foraminal stenosis.   L3-L4: Fusion and decompression with solid arthrodesis   L4-L5: Fusion and decompression with solid arthrodesis   L5-S1:Mild degenerative facet spurring.  No neural impingement   IMPRESSION: 1. No recurrence of the L2-3 extrusion seen in 2020 but there is progressive disc and facet degeneration at this level with moderate left foraminal stenosis. 2. L3-L5 uncomplicated fusion and decompression.     Electronically Signed   By: Tiburcio Pea M.D.   On: 07/05/2022 04:57  Lumbar xrays 06/17/22 FINDINGS: Status post posterior fixation with intervertebral spacer placement of L3-5. Orthopedic hardware is intact and without periprosthetic fracture or lucency. There are five non-rib bearing lumbar-type vertebral bodies. There is dextrocurvature of the lumbar spine, mildly increased in comparison to prior. There is retrolisthesis of L2-3, mildly increased in comparison to prior. No  acute compression fracture deformity. Similar wedging of T11. Mild intervertebral disc space height loss at L2-3 with subcortical sclerosis and asymmetric disc space height loss. Moderate intervertebral disc space height loss of L5-S1. Facet arthropathy. Atherosclerotic calcifications.   IMPRESSION: 1. Status post posterior fixation of L3-5 without evidence of hardware complication. 2. Mildly increased retrolisthesis of L2 on L3 with increased degenerative changes at this level.     Electronically Signed   By: Meda Klinefelter M.D.   On: 06/22/2022 13:02  IMPRESSION  Mr. Calkin is a 62 y.o. male who I performed a telephone encounter today for evaluation and management of: Acute on chronic lumbosacral pain  PLAN  Juan Garza is 62 year old with a significant past medical history for uncontrolled diabetes and thrombocytopenia presenting today via telephone visit to review his MRI results and x-ray results.  His symptoms are largely unchanged.  He does have progressive degenerative changes above his previous fusion construct at L2-3 with corresponding facet arthropathy and an underlying spondylolisthesis.  We discussed that this could be the generator of his pain however there is not any underlying nerve compression.  It may be reasonable to consider  dynamic x-rays however he is looking for something to provide him with relief.  I suggested attempting facet injections at this level.  I will refer him to PM&R for this.  I encouraged him to continue with physical therapy at this time as he is scheduled for his initial evaluation next week.  Should he fail to improve with facet injections, it may be reasonable to have him undergo orthopedic evaluation.  We reserve flexion-extension x-rays for if injections are not helpful.  I offered him a return appointment in 6 to 8 weeks however he would prefer to keep our office updated on his response to injections and we will schedule a follow-up appointment if  needed.  No orders of the defined types were placed in this encounter.   DISPOSITION  Follow up: In person appointment PRN  Susanne Borders, PA   TELEPHONE DOCUMENTATION   This visit was performed via telephone.  Patient location: home Provider location: office  I spent a total of 10 minutes non-face-to-face activities for this visit on the date of this encounter including review of current clinical condition and response to treatment.  The patient is aware of and accepts the limits of this telehealth visit.

## 2022-07-09 ENCOUNTER — Ambulatory Visit (INDEPENDENT_AMBULATORY_CARE_PROVIDER_SITE_OTHER): Payer: BC Managed Care – PPO | Admitting: Neurosurgery

## 2022-07-09 DIAGNOSIS — M47819 Spondylosis without myelopathy or radiculopathy, site unspecified: Secondary | ICD-10-CM

## 2022-07-09 DIAGNOSIS — Z981 Arthrodesis status: Secondary | ICD-10-CM

## 2022-07-09 DIAGNOSIS — M4316 Spondylolisthesis, lumbar region: Secondary | ICD-10-CM | POA: Diagnosis not present

## 2022-07-11 NOTE — H&P (Incomplete)
Pre-Procedure H&P   Patient ID: Juan Garza is a 62 y.o. male.  Gastroenterology Provider: Jaynie Collins, DO  Referring Provider: Tawni Pummel, PA PCP: Myrene Buddy, NP  Date: 07/12/2022  HPI [name] is a 62 y.o. male who presents today for Esophagogastroduodenoscopy for Esophageal varices surveillance, dysphagia, abnormal CT .  Patient with a history of cirrhosis and portal hypertension with known esophageal varices.  Last underwent EGD in December 2022 demonstrating grade 2 esophageal varices, portal hypertensive gastropathy, GAVE.  Biopsies were negative for H. pylori and gastric intestinal metaplasia.  He also had an EGD in December 2021 demonstrating varices.  Recently has been having more issues with dysphagia with solids.  He notes suprasternal sticking with meats and breads and pills.  Describes issues with transferring in the upper esophagus. No issues with liquids or odynophagia.  Underwent CT in February 2024 demonstrating irregular thickening of the esophagus.  He is currently on carvedilol therapy.  He is also on Ozempic which been held for this procedure (last taken over a week ago)  Hemoglobin 17.9 MCV 98 platelets 56,000 creatinine 0.7   Past Medical History:  Diagnosis Date   A-fib (HCC)    Arthritis    Chronic back pain    Cirrhosis (HCC)    Colon polyps    Diabetes mellitus without complication (HCC)    Gallstones    GERD (gastroesophageal reflux disease)    GIB (gastrointestinal bleeding)    r/t NSAID use   Gout    Headache    Hyperlipidemia    Hypertension    Right-sided Bell's palsy    Sleep apnea    Thrombocytopenia (HCC)    Varices, gastric     Past Surgical History:  Procedure Laterality Date   ABDOMINAL SURGERY     APPENDECTOMY     BACK SURGERY     lumbar spine fusion in 2012 and 2017   CARDIAC CATHETERIZATION     colectomy partial w/anastoamosis     COLON SURGERY     COLONOSCOPY WITH PROPOFOL N/A  01/22/2020   Procedure: COLONOSCOPY WITH PROPOFOL;  Surgeon: Regis Bill, MD;  Location: [facility] ENDOSCOPY;  Service: Endoscopy;  Laterality: N/A;   ESOPHAGOGASTRODUODENOSCOPY N/A 01/29/2021   Procedure: ESOPHAGOGASTRODUODENOSCOPY (EGD);  Surgeon: Jaynie Collins, DO;  Location: [facility] ENDOSCOPY;  Service: Gastroenterology;  Laterality: N/A;  DM   ESOPHAGOGASTRODUODENOSCOPY (EGD) WITH PROPOFOL N/A 01/22/2020   Procedure: ESOPHAGOGASTRODUODENOSCOPY (EGD) WITH PROPOFOL;  Surgeon: Regis Bill, MD;  Location: [facility] ENDOSCOPY;  Service: Endoscopy;  Laterality: N/A;   HERNIA REPAIR     JOINT REPLACEMENT     KNEE SURGERY Left    LUMBAR DISC SURGERY  2021   NASAL SINUS SURGERY      Family History No h/o GI disease or malignancy  Review of Systems  Constitutional:  Negative for activity change, appetite change, chills, diaphoresis, fatigue, fever and unexpected weight change.  HENT:  Positive for trouble swallowing. Negative for voice change.   Respiratory:  Negative for shortness of breath and wheezing.   Cardiovascular:  Negative for chest pain, palpitations and leg swelling.  Gastrointestinal:  Negative for abdominal distention, abdominal pain, anal bleeding, blood in stool, constipation, diarrhea, nausea and vomiting.  Musculoskeletal:  Negative for arthralgias and myalgias.  Skin:  Negative for color change and pallor.  Neurological:  Negative for dizziness, syncope and weakness.  Psychiatric/Behavioral:  Negative for confusion. The patient is not nervous/anxious.   All other systems reviewed and are negative.  Medications No current facility-administered medications on file prior to encounter.   Current Outpatient Medications on File Prior to Encounter  Medication Sig Dispense Refill   atorvastatin (LIPITOR) 20 MG tablet Take 20 mg by mouth daily.     dutasteride (AVODART) 0.5 MG capsule Take 0.5 mg by mouth daily.     empagliflozin (JARDIANCE) 25 MG TABS tablet  Take 25 mg by mouth daily.     furosemide (LASIX) 20 MG tablet Take 20 mg by mouth daily as needed for fluid or edema.     lisinopril (ZESTRIL) 10 MG tablet Take 10 mg by mouth daily.     pregabalin (LYRICA) 75 MG capsule Take 75 mg by mouth See admin instructions. Take 1 capsule (75mg ) by mouth every morning and take 2 capsules (150mg ) by mouth every night at bedtime     acetaminophen (TYLENOL) 500 MG tablet Take 500-1,000 mg by mouth every 6 (six) hours as needed for mild pain or moderate pain.     methocarbamol (ROBAXIN) 750 MG tablet Take 750 mg by mouth every 8 (eight) hours as needed for muscle spasms.     ondansetron (ZOFRAN) 8 MG tablet Take 8 mg by mouth every 8 (eight) hours as needed for nausea or vomiting.     Semaglutide,0.25 or 0.5MG /DOS, (OZEMPIC, 0.25 OR 0.5 MG/DOSE,) 2 MG/1.5ML SOPN Inject 0.5 mg into the skin once a week.      Pertinent medications related to GI and procedure were reviewed by me with the patient prior to the procedure   Current Facility-Administered Medications:    0.9 %  sodium chloride infusion, , Intravenous, Continuous, Jaynie Collins, DO  sodium chloride         Allergies  Allergen Reactions   Quetiapine Other (See Comments)    Uncontrolled leg movements  Other reaction(s): Other (See Comments), Other (See Comments), Other (See Comments)  Leg spasms  "uncontrollable leg kicking"   Sumatriptan Other (See Comments) and Anxiety    shaking  Shaky, body aches   Nsaids Other (See Comments)    Varices, low platelet count   Seroquel [Quetiapine Fumarate] Other (See Comments)    Leg spasms   Tape Itching, Rash and Other (See Comments)   Allergies were reviewed by me prior to the procedure  Objective   Body mass index is 33.05 kg/m. Vitals:   07/12/22 0700 07/12/22 0812  BP:  128/78  Pulse:  (!) 53  Resp:  16  Temp:  (!) 97.5 F (36.4 C)  TempSrc:  Temporal  SpO2:  100%  Weight: 118 kg 119.9 kg  Height:  6\' 3"  (1.905 m)      Physical Exam Vitals and nursing note reviewed.  Constitutional:      General: He is not in acute distress.    Appearance: Normal appearance. He is not ill-appearing, toxic-appearing or diaphoretic.  HENT:     Head: Normocephalic and atraumatic.     Nose: Nose normal.     Mouth/Throat:     Mouth: Mucous membranes are moist.     Pharynx: Oropharynx is clear.  Eyes:     General: No scleral icterus.    Extraocular Movements: Extraocular movements intact.  Cardiovascular:     Rate and Rhythm: Regular rhythm. Bradycardia present.     Heart sounds: Normal heart sounds. No murmur heard.    No friction rub. No gallop.  Pulmonary:     Effort: Pulmonary effort is normal. No respiratory distress.     Breath sounds: Normal  breath sounds. No wheezing, rhonchi or rales.  Abdominal:     General: Bowel sounds are normal. There is no distension.     Palpations: Abdomen is soft.     Tenderness: There is no abdominal tenderness. There is no guarding or rebound.  Musculoskeletal:     Cervical back: Neck supple.     Right lower leg: No edema.     Left lower leg: No edema.  Skin:    General: Skin is warm and dry.     Coloration: Skin is not jaundiced or pale.  Neurological:     General: No focal deficit present.     Mental Status: He is alert and oriented to person, place, and time. Mental status is at baseline.  Psychiatric:        Mood and Affect: Mood normal.        Behavior: Behavior normal.        Thought Content: Thought content normal.        Judgment: Judgment normal.      Assessment:  Mr. Cesc Vincente is a 62 y.o. male  who presents today for Esophagogastroduodenoscopy for Esophageal varices surveillance, dysphagia, abnormal CT .  Plan:  Esophagogastroduodenoscopy with possible intervention today  Esophagogastroduodenoscopy with possible biopsy, control of bleeding, polypectomy, and interventions as necessary has been discussed with the patient/patient representative.  Informed consent was obtained from the patient/patient representative after explaining the indication, nature, and risks of the procedure including but not limited to death, bleeding, perforation, missed neoplasm/lesions, cardiorespiratory compromise, and reaction to medications. Opportunity for questions was given and appropriate answers were provided. Patient/patient representative has verbalized understanding is amenable to undergoing the procedure.   Jaynie Collins, DO  [facility]  Portions of the record may have been created with voice recognition software. Occasional wrong-word or 'sound-a-like' substitutions may have occurred due to the inherent limitations of voice recognition software.  Read the chart carefully and recognize, using context, where substitutions may have occurred.

## 2022-07-12 ENCOUNTER — Ambulatory Visit
Admission: RE | Admit: 2022-07-12 | Discharge: 2022-07-12 | Disposition: A | Discharge status: Home / Self Care | Payer: BC Managed Care – PPO | Attending: Gastroenterology | Admitting: Gastroenterology

## 2022-07-12 ENCOUNTER — Ambulatory Visit: Payer: BC Managed Care – PPO | Admitting: Anesthesiology

## 2022-07-12 ENCOUNTER — Encounter
Admission: RE | Disposition: A | Discharge status: Home / Self Care | Payer: Self-pay | Source: Home / Self Care | Attending: Gastroenterology

## 2022-07-12 ENCOUNTER — Other Ambulatory Visit: Payer: Self-pay

## 2022-07-12 ENCOUNTER — Encounter: Payer: Self-pay | Admitting: Gastroenterology

## 2022-07-12 DIAGNOSIS — I8510 Secondary esophageal varices without bleeding: Secondary | ICD-10-CM | POA: Diagnosis not present

## 2022-07-12 DIAGNOSIS — R1310 Dysphagia, unspecified: Secondary | ICD-10-CM | POA: Diagnosis not present

## 2022-07-12 DIAGNOSIS — K766 Portal hypertension: Secondary | ICD-10-CM | POA: Insufficient documentation

## 2022-07-12 DIAGNOSIS — Z7984 Long term (current) use of oral hypoglycemic drugs: Secondary | ICD-10-CM | POA: Diagnosis not present

## 2022-07-12 DIAGNOSIS — K2980 Duodenitis without bleeding: Secondary | ICD-10-CM | POA: Insufficient documentation

## 2022-07-12 DIAGNOSIS — Z79899 Other long term (current) drug therapy: Secondary | ICD-10-CM | POA: Insufficient documentation

## 2022-07-12 DIAGNOSIS — K31819 Angiodysplasia of stomach and duodenum without bleeding: Secondary | ICD-10-CM | POA: Diagnosis not present

## 2022-07-12 DIAGNOSIS — Z13810 Encounter for screening for upper gastrointestinal disorder: Secondary | ICD-10-CM | POA: Diagnosis present

## 2022-07-12 DIAGNOSIS — K3189 Other diseases of stomach and duodenum: Secondary | ICD-10-CM | POA: Insufficient documentation

## 2022-07-12 HISTORY — DX: Thrombocytopenia, unspecified: D69.6

## 2022-07-12 HISTORY — PX: ESOPHAGOGASTRODUODENOSCOPY (EGD) WITH PROPOFOL: SHX5813

## 2022-07-12 HISTORY — DX: Unspecified atrial fibrillation: I48.91

## 2022-07-12 LAB — GLUCOSE, CAPILLARY: Glucose-Capillary: 111 mg/dL — ABNORMAL HIGH (ref 70–99)

## 2022-07-12 SURGERY — ESOPHAGOGASTRODUODENOSCOPY (EGD) WITH PROPOFOL
Anesthesia: General

## 2022-07-12 MED ORDER — DEXMEDETOMIDINE HCL IN NACL 80 MCG/20ML IV SOLN
INTRAVENOUS | Status: DC | PRN
  Administered 2022-07-12: 8 ug via INTRAVENOUS

## 2022-07-12 MED ORDER — PROPOFOL 10 MG/ML IV BOLUS
INTRAVENOUS | Status: DC | PRN
  Administered 2022-07-12: 150 ug/kg/min via INTRAVENOUS
  Administered 2022-07-12: 50 mg via INTRAVENOUS

## 2022-07-12 MED ORDER — SODIUM CHLORIDE 0.9 % IV SOLN: INTRAVENOUS | Status: DC

## 2022-07-12 MED ORDER — LIDOCAINE HCL (CARDIAC) PF 100 MG/5ML IV SOSY
PREFILLED_SYRINGE | INTRAVENOUS | Status: DC | PRN
  Administered 2022-07-12: 100 mg via INTRAVENOUS

## 2022-07-12 NOTE — Anesthesia Preprocedure Evaluation (Addendum)
Anesthesia Evaluation  Patient identified by MRN, date of birth, ID band Patient awake    Reviewed: Allergy & Precautions, NPO status , Patient's Chart, lab work & pertinent test results  Airway Mallampati: III  TM Distance: >3 FB Neck ROM: full  Mouth opening: Limited Mouth Opening  Dental  (+) Teeth Intact   Pulmonary sleep apnea , former smoker   Pulmonary exam normal  + decreased breath sounds      Cardiovascular Exercise Tolerance: Good hypertension, Pt. on medications + dysrhythmias Atrial Fibrillation  Rhythm:Regular     Neuro/Psych  Headaches low back pain and new bilateral buttock and lateral hip pain worse on the right than the left    Neuromuscular disease  negative psych ROS   GI/Hepatic ,GERD  Controlled,,(+) Cirrhosis   Esophageal Varices      Endo/Other  diabetes, Well Controlled, Type 1    Renal/GU negative Renal ROS  negative genitourinary   Musculoskeletal  (+) Arthritis ,    Abdominal  (+) + obese  Peds negative pediatric ROS (+)  Hematology  (+) Blood dyscrasia, anemia Thrombocytopenia   Anesthesia Other Findings Past Medical History: No date: Arthritis No date: Chronic back pain No date: Cirrhosis (HCC) No date: Colon polyps No date: Diabetes mellitus without complication (HCC) No date: GERD (gastroesophageal reflux disease) No date: GIB (gastrointestinal bleeding)     Comment:  r/t NSAID use No date: Gout No date: Headache No date: Hyperlipidemia No date: Hypertension No date: Right-sided Bell's palsy No date: Sleep apnea  Past Surgical History: No date: ABDOMINAL SURGERY No date: APPENDECTOMY No date: BACK SURGERY     Comment:  posteriior lumbar spine fusion No date: CARDIAC CATHETERIZATION No date: colectomy partial w/anastoamosis No date: COLON SURGERY 01/22/2020: COLONOSCOPY WITH PROPOFOL; N/A     Comment:  Procedure: COLONOSCOPY WITH PROPOFOL;  Surgeon:                Regis Bill, MD;  Location: [facility] ENDOSCOPY;                Service: Endoscopy;  Laterality: N/A; 01/22/2020: ESOPHAGOGASTRODUODENOSCOPY (EGD) WITH PROPOFOL; N/A     Comment:  Procedure: ESOPHAGOGASTRODUODENOSCOPY (EGD) WITH               PROPOFOL;  Surgeon: Regis Bill, MD;  Location:               [facility] ENDOSCOPY;  Service: Endoscopy;  Laterality: N/A; No date: HERNIA REPAIR No date: JOINT REPLACEMENT No date: KNEE SURGERY; Left No date: NASAL SINUS SURGERY  BMI    Body Mass Index: 30.23 kg/m      Reproductive/Obstetrics negative OB ROS                             Anesthesia Physical Anesthesia Plan  ASA: 3  Anesthesia Plan: General   Post-op Pain Management:    Induction: Intravenous  PONV Risk Score and Plan: Propofol infusion and TIVA  Airway Management Planned: Natural Airway and Nasal Cannula  Additional Equipment:   Intra-op Plan:   Post-operative Plan:   Informed Consent: I have reviewed the patients History and Physical, chart, labs and discussed the procedure including the risks, benefits and alternatives for the proposed anesthesia with the patient or authorized representative who has indicated his/her understanding and acceptance.     Dental Advisory Given  Plan Discussed with: CRNA and Surgeon  Anesthesia Plan Comments:  Anesthesia Quick Evaluation

## 2022-07-12 NOTE — Transfer of Care (Signed)
Immediate Anesthesia Transfer of Care Note  Patient: Juan Garza  Procedure(s) Performed: ESOPHAGOGASTRODUODENOSCOPY (EGD) WITH PROPOFOL  Patient Location: PACU  Anesthesia Type:General  Level of Consciousness: awake and oriented  Airway & Oxygen Therapy: Patient Spontanous Breathing and Patient connected to nasal cannula oxygen  Post-op Assessment: Report given to RN and Post -op Vital signs reviewed and stable  Post vital signs: stable  Last Vitals:  Vitals Value Taken Time  BP 123/56 07/12/22 0833  Temp 36.4 C 07/12/22 0833  Pulse 64 07/12/22 0833  Resp 18 07/12/22 0833  SpO2 99 % 07/12/22 0833  Vitals shown include unvalidated device data.  Last Pain:  Vitals:   07/12/22 0833  TempSrc: Tympanic  PainSc: Asleep         Complications: There were no known notable events for this encounter.

## 2022-07-12 NOTE — Op Note (Signed)
[facility] Patient Name: Juan Garza Procedure Date: 07/12/2022 8:19 AM MRN: [id number] Account #: [id number] Date of Birth: 29-Jul-1960 Admit Type: Outpatient Age: 62 Room: [facility] ENDO ROOM 2 Gender: Male Note Status: Finalized Instrument Name: Upper Endoscope [id number] Procedure:             Upper GI endoscopy Indications:           Surveillance procedure, 1st degree variceal                         surveillance (known small varices, no prior bleeding) Providers:             Trenda Moots, DO Referring MD:          Jaynie Collins DO, DO (Referring MD) Medicines:             Monitored Anesthesia Care Complications:         No immediate complications. Estimated blood loss: None. Procedure:             Pre-Anesthesia Assessment:                        - Prior to the procedure, a History and Physical was                         performed, and patient medications and allergies were                         reviewed. The patient is competent. The risks and                         benefits of the procedure and the sedation options and                         risks were discussed with the patient. All questions                         were answered and informed consent was obtained.                         Patient identification and proposed procedure were                         verified by the physician, the nurse, the anesthetist                         and the technician in the endoscopy suite. Mental                         Status Examination: alert and oriented. Airway                         Examination: normal oropharyngeal airway and neck                         mobility. Respiratory Examination: clear to                         auscultation. CV Examination: bradycardia noted.  Prophylactic Antibiotics: The patient does not require                         prophylactic antibiotics. Prior Anticoagulants: The                          patient has taken no anticoagulant or antiplatelet                         agents. ASA Grade Assessment: III - A patient with                         severe systemic disease. After reviewing the risks and                         benefits, the patient was deemed in satisfactory                         condition to undergo the procedure. The anesthesia                         plan was to use monitored anesthesia care (MAC).                         Immediately prior to administration of medications,                         the patient was re-assessed for adequacy to receive                         sedatives. The heart rate, respiratory rate, oxygen                         saturations, blood pressure, adequacy of pulmonary                         ventilation, and response to care were monitored                         throughout the procedure. The physical status of the                         patient was re-assessed after the procedure.                        After obtaining informed consent, the endoscope was                         passed under direct vision. Throughout the procedure,                         the patient's blood pressure, pulse, and oxygen                         saturations were monitored continuously. The Endoscope                         was introduced through the mouth, and advanced to the  second part of duodenum. The upper GI endoscopy was                         accomplished without difficulty. The patient tolerated                         the procedure well. Findings:      Localized mild inflammation characterized by erythema was found in the       duodenal bulb. Estimated blood loss: none.      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 Estimated blood loss: none.      Mild portal hypertensive gastropathy was found in the gastric body.       Estimated blood loss:  none.      The exam of the stomach was otherwise normal.      No gastric varices. Estimated blood loss: none.      The Z-line was regular. Estimated blood loss: none.      Esophagogastric landmarks were identified: the gastroesophageal junction       was found at 40 cm from the incisors.      Grade II varices were found in the lower third of the esophagus. 3       Columns of varices were appreciated. One column did not completely       flatten. No recent stigmata of bleeding. Estimated blood loss: none.      No endoscopic abnormality was evident in the esophagus to explain the       patient's complaint of dysphagia. Estimated blood loss: none.      The exam of the esophagus was otherwise normal. Impression:            - Duodenitis.                        - Gastric antral vascular ectasia without bleeding.                        - Portal hypertensive gastropathy.                        - Z-line regular.                        - Esophagogastric landmarks identified.                        - Grade II esophageal varices.                        - No endoscopic esophageal abnormality to explain                         patient's dysphagia.                        - No specimens collected. Recommendation:        - Patient has a contact number available for                         emergencies. The signs and symptoms of potential  delayed complications were discussed with the patient.                         Return to normal activities tomorrow. Written                         discharge instructions were provided to the patient.                        - Discharge patient to home.                        - Resume previous diet ; moisten and chew food well.                         Small bites.                        - Continue present medications.                        - No ibuprofen, naproxen, or other non-steroidal                         anti-inflammatory drugs.                         - Repeat upper endoscopy in 1 year for surveillance.                        - Return to GI office as previously scheduled.                        - Recommend Modified barium swallow study/esophagram.                         Possible Manometry and motility if dysphagia persists                         (although would use caution with probe with placement).                        No signs of narrowing in the esophagus to explain                         patient's dysphagia.                        - The findings and recommendations were discussed with                         the patient. Procedure Code(s):     --- Professional ---                        [phone], Esophagogastroduodenoscopy, flexible,                         transoral; diagnostic, including collection of                         specimen(s) by brushing  or washing, when performed                         (separate procedure) Diagnosis Code(s):     --- Professional ---                        K29.80, Duodenitis without bleeding                        K31.819, Angiodysplasia of stomach and duodenum                         without bleeding                        K76.6, Portal hypertension                        K31.89, Other diseases of stomach and duodenum                        I85.00, Esophageal varices without bleeding                        R13.10, Dysphagia, unspecified CPT copyright 2022 American Medical Association. All rights reserved. The codes documented in this report are preliminary and upon coder review may  be revised to meet current compliance requirements. Attending Participation:      I personally performed the entire procedure. Elfredia Nevins, DO Jaynie Collins DO, DO 07/12/2022 8:37:49 AM This report has been signed electronically. Number of Addenda: 0 Note Initiated On: 07/12/2022 8:19 AM Estimated Blood Loss:  Estimated blood loss: none.      [facility]

## 2022-07-12 NOTE — Interval H&P Note (Signed)
History and Physical Interval Note: Preprocedure H&P from 07/12/22  was reviewed and there was no interval change after seeing and examining the patient.  Written consent was obtained from the patient after discussion of risks, benefits, and alternatives. Patient has consented to proceed with Esophagogastroduodenoscopy with possible intervention   07/12/2022 8:17 AM  Juan Garza  has presented today for surgery, with the diagnosis of 793.4 (ICD-9-CM) - R93.3 (ICD-10-CM) - Abnormal CT scan, gastrointestinal tract.  The various methods of treatment have been discussed with the patient and family. After consideration of risks, benefits and other options for treatment, the patient has consented to  Procedure(s): ESOPHAGOGASTRODUODENOSCOPY (EGD) WITH PROPOFOL (N/A) as a surgical intervention.  The patient's history has been reviewed, patient examined, no change in status, stable for surgery.  I have reviewed the patient's chart and labs.  Questions were answered to the patient's satisfaction.     Jaynie Collins

## 2022-07-12 NOTE — Anesthesia Postprocedure Evaluation (Signed)
Anesthesia Post Note  Patient: Juan Garza  Procedure(s) Performed: ESOPHAGOGASTRODUODENOSCOPY (EGD) WITH PROPOFOL  Patient location during evaluation: Endoscopy Anesthesia Type: General Level of consciousness: awake and alert Pain management: pain level controlled Vital Signs Assessment: post-procedure vital signs reviewed and stable Respiratory status: spontaneous breathing, nonlabored ventilation and respiratory function stable Cardiovascular status: blood pressure returned to baseline and stable Postop Assessment: no apparent nausea or vomiting Anesthetic complications: no   There were no known notable events for this encounter.   Last Vitals:  Vitals:   07/12/22 0843 07/12/22 0853  BP: 106/63 137/70  Pulse: 61 (!) 58  Resp: 19 20  Temp:    SpO2: 97% 99%    Last Pain:  Vitals:   07/12/22 0853  TempSrc:   PainSc: 0-No pain                 Foye Deer

## 2022-07-13 ENCOUNTER — Encounter: Payer: Self-pay | Admitting: Gastroenterology

## 2022-08-04 ENCOUNTER — Telehealth: Payer: Self-pay | Admitting: *Deleted

## 2022-08-04 NOTE — Telephone Encounter (Signed)
Received a fax that pt  needs clearance to get  to Dr. Donneta Romberg. He suggests that the pt get 2 units of plt. In am and have pt. Get inj. In afternoon of the same day. Dr. Yves Dill will be doing the procedure. Albin Felling will call pt and pick a date and make sure when he wants to do this and the fact he will need 2 units plt and then later on same day get inj. Albin Felling will call me back with date so we can set up plt. Transfusion.

## 2022-08-20 ENCOUNTER — Encounter: Payer: Self-pay | Admitting: Oncology

## 2022-08-20 NOTE — Telephone Encounter (Signed)
What procedure is he having?  I am typically okay with a platelet count of more than 50 so we will need to get in touch with Dr. Margaretann Loveless office to see if he has any specific requirements.  Based on that we will decide what needs to be done.  He will likely need CBC to be checked 3 days prior to procedure

## 2022-08-24 ENCOUNTER — Encounter: Payer: Self-pay | Admitting: Hematology and Oncology

## 2022-08-24 NOTE — Telephone Encounter (Signed)
Patient has been scheduled to have labs done on 08/27/22.

## 2022-08-26 ENCOUNTER — Other Ambulatory Visit: Payer: Self-pay | Admitting: *Deleted

## 2022-08-26 DIAGNOSIS — D696 Thrombocytopenia, unspecified: Secondary | ICD-10-CM

## 2022-08-27 ENCOUNTER — Other Ambulatory Visit: Payer: Self-pay | Admitting: *Deleted

## 2022-08-27 ENCOUNTER — Other Ambulatory Visit: Payer: Self-pay | Admitting: Oncology

## 2022-08-27 ENCOUNTER — Telehealth: Payer: Self-pay | Admitting: *Deleted

## 2022-08-27 ENCOUNTER — Inpatient Hospital Stay: Payer: BC Managed Care – PPO | Attending: Oncology

## 2022-08-27 DIAGNOSIS — D696 Thrombocytopenia, unspecified: Secondary | ICD-10-CM

## 2022-08-27 DIAGNOSIS — D61818 Other pancytopenia: Secondary | ICD-10-CM | POA: Diagnosis present

## 2022-08-27 DIAGNOSIS — K7460 Unspecified cirrhosis of liver: Secondary | ICD-10-CM | POA: Diagnosis not present

## 2022-08-27 LAB — CBC
HCT: 40.3 % (ref 39.0–52.0)
Hemoglobin: 14 g/dL (ref 13.0–17.0)
MCH: 30.8 pg (ref 26.0–34.0)
MCHC: 34.7 g/dL (ref 30.0–36.0)
MCV: 88.6 fL (ref 80.0–100.0)
Platelets: 52 10*3/uL — ABNORMAL LOW (ref 150–400)
RBC: 4.55 MIL/uL (ref 4.22–5.81)
RDW: 13.9 % (ref 11.5–15.5)
WBC: 4.5 10*3/uL (ref 4.0–10.5)
nRBC: 0 % (ref 0.0–0.2)

## 2022-08-27 NOTE — Telephone Encounter (Signed)
I called the pt and got his voicemail and left message that the last plt count  was 52 and that is not enough count in order to get the steroid injection on 7/23.He needs to come on 7/23  at 9 am so that we can give you 2 plt. Transfusion and then go to Dr. Yves Dill to get the injection. Please call me and see if that is ok with him. Left my direct number

## 2022-08-30 LAB — BPAM PLATELET PHERESIS: Unit Type and Rh: 6200

## 2022-08-30 LAB — PREPARE PLATELET PHERESIS: Unit division: 0

## 2022-08-31 ENCOUNTER — Inpatient Hospital Stay: Payer: BC Managed Care – PPO

## 2022-08-31 VITALS — BP 118/65 | HR 71 | Temp 98.1°F | Resp 18

## 2022-08-31 DIAGNOSIS — D696 Thrombocytopenia, unspecified: Secondary | ICD-10-CM

## 2022-08-31 DIAGNOSIS — D61818 Other pancytopenia: Secondary | ICD-10-CM | POA: Diagnosis not present

## 2022-08-31 LAB — PREPARE PLATELET PHERESIS

## 2022-08-31 LAB — BPAM PLATELET PHERESIS
ISSUE DATE / TIME: 202407231045
Unit Type and Rh: 7300

## 2022-08-31 MED ORDER — ACETAMINOPHEN 325 MG PO TABS
650.0000 mg | ORAL_TABLET | Freq: Once | ORAL | Status: AC
  Administered 2022-08-31: 650 mg via ORAL
  Filled 2022-08-31: qty 2

## 2022-08-31 MED ORDER — SODIUM CHLORIDE 0.9% IV SOLUTION
250.0000 mL | Freq: Once | INTRAVENOUS | Status: AC
  Administered 2022-08-31: 250 mL via INTRAVENOUS
  Filled 2022-08-31: qty 250

## 2022-08-31 NOTE — Patient Instructions (Signed)
[facility] [facility]  Discharge Instructions: Thank you for choosing [facility] to provide your oncology and hematology care.  If you have a lab appointment with the Cancer Center, please go directly to the Cancer Center and check in at the registration area.  Wear comfortable clothing and clothing appropriate for easy access to any Portacath or PICC line.   We strive to give you quality time with your provider. You may need to reschedule your appointment if you arrive late (15 or more minutes).  Arriving late affects you and other patients whose appointments are after yours.  Also, if you miss three or more appointments without notifying the office, you may be dismissed from the clinic at the provider's discretion.      For prescription refill requests, have your pharmacy contact our office and allow 72 hours for refills to be completed.    Today you received the following chemotherapy and/or immunotherapy agents PLATELETS      To help prevent nausea and vomiting after your treatment, we encourage you to take your nausea medication as directed.  BELOW ARE SYMPTOMS THAT SHOULD BE REPORTED IMMEDIATELY: *FEVER GREATER THAN 100.4 F (38 C) OR HIGHER *CHILLS OR SWEATING *NAUSEA AND VOMITING THAT IS NOT CONTROLLED WITH YOUR NAUSEA MEDICATION *UNUSUAL SHORTNESS OF BREATH *UNUSUAL BRUISING OR BLEEDING *URINARY PROBLEMS (pain or burning when urinating, or frequent urination) *BOWEL PROBLEMS (unusual diarrhea, constipation, pain near the anus) TENDERNESS IN MOUTH AND THROAT WITH OR WITHOUT PRESENCE OF ULCERS (sore throat, sores in mouth, or a toothache) UNUSUAL RASH, SWELLING OR PAIN  UNUSUAL VAGINAL DISCHARGE OR ITCHING   Items with * indicate a potential emergency and should be followed up as soon as possible or go to the Emergency Department if any problems should occur.  Please show the CHEMOTHERAPY ALERT CARD or IMMUNOTHERAPY ALERT CARD at check-in to  the Emergency Department and triage nurse.  Should you have questions after your visit or need to cancel or reschedule your appointment, please contact [facility]  [phone] and follow the prompts.  Office hours are 8:00 a.m. to 4:30 p.m. Monday - Friday. Please note that voicemails left after 4:00 p.m. may not be returned until the following business day.  We are closed weekends and major holidays. You have access to a nurse at all times for urgent questions. Please call the main number to the clinic [phone] and follow the prompts.  For any non-urgent questions, you may also contact your provider using MyChart. We now offer e-Visits for anyone 19 and older to request care online for non-urgent symptoms. For details visit [email or web address].   Also download the MyChart app! Go to the app store, search "MyChart", open the app, select [facility], and log in with your MyChart username and password.

## 2022-09-01 ENCOUNTER — Encounter: Payer: Self-pay | Admitting: Hematology and Oncology

## 2022-09-01 ENCOUNTER — Other Ambulatory Visit (HOSPITAL_COMMUNITY): Payer: Self-pay

## 2022-09-01 ENCOUNTER — Other Ambulatory Visit: Payer: Self-pay | Admitting: *Deleted

## 2022-09-01 ENCOUNTER — Telehealth: Payer: Self-pay | Admitting: Pharmacist

## 2022-09-01 DIAGNOSIS — D696 Thrombocytopenia, unspecified: Secondary | ICD-10-CM

## 2022-09-01 LAB — BPAM PLATELET PHERESIS
Blood Product Expiration Date: 202407242359
Blood Product Expiration Date: 202407242359
ISSUE DATE / TIME: 202407230954
Unit Type and Rh: 6200

## 2022-09-01 LAB — PREPARE PLATELET PHERESIS: Unit division: 0

## 2022-09-01 NOTE — Telephone Encounter (Signed)
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  CBC from 08/27/22 assessed, no relevant lab abnormalities. Prescription dose and frequency assessed.   Current medication list in Epic reviewed, no DDIs with avatrombopag identified.  Evaluated chart and no patient barriers to medication adherence identified.   Prescription has been e-scribed to the [facility] for benefits analysis and approval.  [facility] will continue to follow for insurance authorization, copayment issues, initial counseling and start date.   Remi Haggard, PharmD, BCPS, BCOP, CPP Hematology/Oncology Clinical Pharmacist Practitioner /[facility] [phone]  09/01/2022 12:26 PM

## 2022-09-02 ENCOUNTER — Inpatient Hospital Stay: Payer: BC Managed Care – PPO

## 2022-09-02 DIAGNOSIS — D696 Thrombocytopenia, unspecified: Secondary | ICD-10-CM

## 2022-09-02 DIAGNOSIS — D61818 Other pancytopenia: Secondary | ICD-10-CM | POA: Diagnosis not present

## 2022-09-02 LAB — CBC
HCT: 46.2 % (ref 39.0–52.0)
Hemoglobin: 15.4 g/dL (ref 13.0–17.0)
MCH: 30.6 pg (ref 26.0–34.0)
MCHC: 33.3 g/dL (ref 30.0–36.0)
MCV: 91.8 fL (ref 80.0–100.0)
Platelets: 68 10*3/uL — ABNORMAL LOW (ref 150–400)
RBC: 5.03 MIL/uL (ref 4.22–5.81)
RDW: 14.5 % (ref 11.5–15.5)
WBC: 4.4 10*3/uL (ref 4.0–10.5)
nRBC: 0 % (ref 0.0–0.2)

## 2022-09-07 ENCOUNTER — Encounter: Payer: Self-pay | Admitting: Oncology

## 2022-09-07 ENCOUNTER — Inpatient Hospital Stay (HOSPITAL_BASED_OUTPATIENT_CLINIC_OR_DEPARTMENT_OTHER): Payer: BC Managed Care – PPO | Admitting: Oncology

## 2022-09-07 ENCOUNTER — Inpatient Hospital Stay: Payer: BC Managed Care – PPO | Admitting: Pharmacist

## 2022-09-07 ENCOUNTER — Inpatient Hospital Stay: Payer: BC Managed Care – PPO

## 2022-09-07 VITALS — BP 131/72 | HR 77 | Temp 97.7°F | Resp 18 | Ht 75.0 in | Wt 262.0 lb

## 2022-09-07 DIAGNOSIS — K7469 Other cirrhosis of liver: Secondary | ICD-10-CM | POA: Diagnosis not present

## 2022-09-07 DIAGNOSIS — D696 Thrombocytopenia, unspecified: Secondary | ICD-10-CM

## 2022-09-07 DIAGNOSIS — D61818 Other pancytopenia: Secondary | ICD-10-CM | POA: Diagnosis not present

## 2022-09-07 LAB — CBC WITH DIFFERENTIAL (CANCER CENTER ONLY)
Abs Immature Granulocytes: 0.01 10*3/uL (ref 0.00–0.07)
Basophils Absolute: 0 10*3/uL (ref 0.0–0.1)
Basophils Relative: 1 %
Eosinophils Absolute: 0.2 10*3/uL (ref 0.0–0.5)
Eosinophils Relative: 4 %
HCT: 43.1 % (ref 39.0–52.0)
Hemoglobin: 14.7 g/dL (ref 13.0–17.0)
Immature Granulocytes: 0 %
Lymphocytes Relative: 29 %
Lymphs Abs: 1.1 10*3/uL (ref 0.7–4.0)
MCH: 30.8 pg (ref 26.0–34.0)
MCHC: 34.1 g/dL (ref 30.0–36.0)
MCV: 90.4 fL (ref 80.0–100.0)
Monocytes Absolute: 0.4 10*3/uL (ref 0.1–1.0)
Monocytes Relative: 10 %
Neutro Abs: 2.1 10*3/uL (ref 1.7–7.7)
Neutrophils Relative %: 56 %
Platelet Count: 64 10*3/uL — ABNORMAL LOW (ref 150–400)
RBC: 4.77 MIL/uL (ref 4.22–5.81)
RDW: 14.2 % (ref 11.5–15.5)
WBC Count: 3.7 10*3/uL — ABNORMAL LOW (ref 4.0–10.5)
nRBC: 0 % (ref 0.0–0.2)

## 2022-09-07 NOTE — Progress Notes (Signed)
Patient not seen, pltcs continue to be >50. MD no longer pursing avatrombopag.

## 2022-09-07 NOTE — Telephone Encounter (Signed)
Patient pltc continues to be >50, at this time MD no longer pursuing avatrombopag.

## 2022-09-08 ENCOUNTER — Encounter: Payer: Self-pay | Admitting: Hematology and Oncology

## 2022-09-08 NOTE — Progress Notes (Signed)
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 Merlene Pulling for pancytopenia.Bone marrow on 12/17/2019 revealed a hypercellular marrow with dysmegakaryocytopoiesis. The features were suggestive of a low-grade myelodysplastic syndrome. Cytogenetics were normal (46, XY).  Bone marrow review at [facility] did not reveal sufficient dysplasia to meet diagnostic criteria for a myelodysplastic syndrome.  The minimal dysplasia, leukopenia and thrombocytopenia in the marrow may be the result of reactive changes.  Early low-grade myelodysplastic syndrome could not be ruled out.  Abdomen and pelvis CT on 01/02/2020 revealed morphologic changes to the liver raising the possibility of cirrhosis. There were sequelae of portal venous hypertension including splenomegaly (15.5 cm) and upper abdominal collateral vessels. There was cholelithiasis. There was mild gallbladder wall thickening which may be secondary to portal venous hypertension/cirrhosis.  Patient follows up with [facility] hepatology for his cirrhosis.     Interval history-patient has been having ongoing back pain and sees orthopedics for this.  He will is hoping to undergo interventional procedures including epidural injections which has been on hold due to his thrombocytopenia.  ECOG PS- 1 Pain scale- 5 Opioid associated constipation- no  Review of systems- Review of Systems  Constitutional:  Positive  for malaise/fatigue. Negative for chills, fever and weight loss.  HENT:  Negative for congestion, ear discharge and nosebleeds.   Eyes:  Negative for blurred vision.  Respiratory:  Negative for cough, hemoptysis, sputum production, shortness of breath and wheezing.   Cardiovascular:  Negative for chest pain, palpitations, orthopnea and claudication.  Gastrointestinal:  Negative for abdominal pain, blood in stool, constipation, diarrhea, heartburn, melena, nausea and vomiting.  Genitourinary:  Negative for dysuria, flank pain, frequency, hematuria and urgency.  Musculoskeletal:  Positive for back pain. Negative for joint pain and myalgias.  Skin:  Negative for rash.  Neurological:  Negative for dizziness, tingling, focal weakness, seizures, weakness and headaches.  Endo/Heme/Allergies:  Does not bruise/bleed easily.  Psychiatric/Behavioral:  Negative for depression and suicidal ideas. The patient does not have insomnia.       Allergies  Allergen Reactions   Quetiapine Other (See Comments)    Uncontrolled leg movements  Other reaction(s): Other (See Comments), Other (See Comments), Other (See Comments)  Leg spasms  "uncontrollable leg kicking"   Sumatriptan Other (See Comments) and Anxiety    shaking  Shaky, body aches   Nsaids Other (See Comments)    Varices, low platelet count   Seroquel [Quetiapine Fumarate] Other (See Comments)    Leg spasms   Tape Itching, Rash and Other (See Comments)     Past Medical History:  Diagnosis Date   A-fib (HCC)    Arthritis    Chronic back pain    Cirrhosis (HCC)    Colon polyps    Diabetes mellitus without complication (HCC)    Gallstones    GERD (gastroesophageal reflux disease)    GIB (gastrointestinal bleeding)    r/t NSAID use   Gout    Headache    Hyperlipidemia    Hypertension    Right-sided  Bell's palsy    Sleep apnea    Thrombocytopenia (HCC)    Varices, gastric      Past Surgical History:  Procedure Laterality  Date   ABDOMINAL SURGERY     APPENDECTOMY     BACK SURGERY     lumbar spine fusion in 2012 and 2017   CARDIAC CATHETERIZATION     colectomy partial w/anastoamosis     COLON SURGERY     COLONOSCOPY WITH PROPOFOL N/A 01/22/2020   Procedure: COLONOSCOPY WITH PROPOFOL;  Surgeon: Regis Bill, MD;  Location: [facility] ENDOSCOPY;  Service: Endoscopy;  Laterality: N/A;   ESOPHAGOGASTRODUODENOSCOPY N/A 01/29/2021   Procedure: ESOPHAGOGASTRODUODENOSCOPY (EGD);  Surgeon: Jaynie Collins, DO;  Location: [facility] ENDOSCOPY;  Service: Gastroenterology;  Laterality: N/A;  DM   ESOPHAGOGASTRODUODENOSCOPY (EGD) WITH PROPOFOL N/A 01/22/2020   Procedure: ESOPHAGOGASTRODUODENOSCOPY (EGD) WITH PROPOFOL;  Surgeon: Regis Bill, MD;  Location: [facility] ENDOSCOPY;  Service: Endoscopy;  Laterality: N/A;   ESOPHAGOGASTRODUODENOSCOPY (EGD) WITH PROPOFOL N/A 07/12/2022   Procedure: ESOPHAGOGASTRODUODENOSCOPY (EGD) WITH PROPOFOL;  Surgeon: Jaynie Collins, DO;  Location: [facility] ENDOSCOPY;  Service: Gastroenterology;  Laterality: N/A;   HERNIA REPAIR     JOINT REPLACEMENT     KNEE SURGERY Left    LUMBAR DISC SURGERY  2021   NASAL SINUS SURGERY      Social History   Socioeconomic History   Marital status: Married    Spouse name: Not on file   Number of children: Not on file   Years of education: Not on file   Highest education level: Not on file  Occupational History   Not on file  Tobacco Use   Smoking status: Former   Smokeless tobacco: Never  Vaping Use   Vaping status: Never Used  Substance and Sexual Activity   Alcohol use: Not Currently    Comment: none last 24hrs   Drug use: Never   Sexual activity: Not on file  Other Topics Concern   Not on file  Social History Narrative   Works third shift ar Systems developer.    Social Determinants of Health   Financial Resource Strain: Not on file  Food Insecurity: No Food Insecurity (01/29/2022)   Hunger  Vital Sign    Worried About Running Out of Food in the Last Year: Never true    Ran Out of Food in the Last Year: Never true  Transportation Needs: No Transportation Needs (01/29/2022)   PRAPARE - Administrator, Civil Service (Medical): No    Lack of Transportation (Non-Medical): No  Physical Activity: Not on file  Stress: Not on file  Social Connections: Unknown (04/06/2022)   Received from [facility], [facility]   Social Network    Social Network: Not on file  Intimate Partner Violence: Unknown (04/06/2022)   Received from [facility], [facility]   HITS    Physically Hurt: Not on file    Insult or Talk Down To: Not on file    Threaten Physical Harm: Not on file    Scream or Curse: Not on file    Family History  Problem Relation Age of Onset   Atrial fibrillation Mother    Diabetes Father      Current Outpatient Medications:    acetaminophen (TYLENOL) 500 MG tablet, Take 500-1,000 mg by mouth every 6 (six) hours as needed for mild pain or moderate pain., Disp: , Rfl:    atorvastatin (LIPITOR) 20 MG tablet, Take 20 mg by  mouth daily., Disp: , Rfl:    carvedilol (COREG) 12.5 MG tablet, Take 12.5 mg by mouth 2 (two) times daily with a meal., Disp: , Rfl:    dutasteride (AVODART) 0.5 MG capsule, Take 0.5 mg by mouth daily., Disp: , Rfl:    empagliflozin (JARDIANCE) 25 MG TABS tablet, Take 25 mg by mouth daily., Disp: , Rfl:    furosemide (LASIX) 20 MG tablet, Take 20 mg by mouth daily as needed for fluid or edema., Disp: , Rfl:    lisinopril (ZESTRIL) 10 MG tablet, Take 10 mg by mouth daily., Disp: , Rfl:    methimazole (TAPAZOLE) 5 MG tablet, Take 5 mg by mouth daily., Disp: , Rfl:    methocarbamol (ROBAXIN) 750 MG tablet, Take 750 mg by mouth every 8 (eight) hours as needed for muscle spasms., Disp: , Rfl:    omeprazole (PRILOSEC) 20 MG capsule, Take 20 mg by mouth 2 (two) times daily before a meal., Disp: , Rfl:    ondansetron (ZOFRAN) 8 MG tablet, Take 8  mg by mouth every 8 (eight) hours as needed for nausea or vomiting., Disp: , Rfl:    pioglitazone (ACTOS) 15 MG tablet, Take 15 mg by mouth daily., Disp: , Rfl:    pregabalin (LYRICA) 75 MG capsule, Take 75 mg by mouth See admin instructions. Take 1 capsule (75mg ) by mouth every morning and take 2 capsules (150mg ) by mouth every night at bedtime, Disp: , Rfl:    Semaglutide,0.25 or 0.5MG /DOS, (OZEMPIC, 0.25 OR 0.5 MG/DOSE,) 2 MG/1.5ML SOPN, Inject 0.5 mg into the skin once a week., Disp: , Rfl:   Physical exam:  Vitals:   09/07/22 0923  BP: 131/72  Pulse: 77  Resp: 18  Temp: 97.7 F (36.5 C)  TempSrc: Tympanic  SpO2: 100%  Weight: 262 lb (118.8 kg)  Height: 6\' 3"  (1.905 m)   Physical Exam Cardiovascular:     Rate and Rhythm: Normal rate and regular rhythm.     Heart sounds: Normal heart sounds.  Pulmonary:     Effort: Pulmonary effort is normal.  Skin:    General: Skin is warm and dry.  Neurological:     Mental Status: He is alert and oriented to person, place, and time.         Latest Ref Rng & Units 02/11/2022   10:21 AM  CMP  Glucose 70 - 99 mg/dL 595   BUN 6 - 20 mg/dL 22   Creatinine 6.38 - 1.24 mg/dL 7.56   Sodium 433 - 295 mmol/L 134   Potassium 3.5 - 5.1 mmol/L 3.9   Chloride 98 - 111 mmol/L 102   CO2 22 - 32 mmol/L 25   Calcium 8.9 - 10.3 mg/dL 8.8   Total Protein 6.5 - 8.1 g/dL 6.2   Total Bilirubin 0.3 - 1.2 mg/dL 2.7   Alkaline Phos 38 - 126 U/L 78   AST 15 - 41 U/L 48   ALT 0 - 44 U/L 48       Latest Ref Rng & Units 09/07/2022    9:12 AM  CBC  WBC 4.0 - 10.5 K/uL 3.7   Hemoglobin 13.0 - 17.0 g/dL 18.8   Hematocrit 41.6 - 52.0 % 43.1   Platelets 150 - 400 K/uL 64     No images are attached to the encounter.  No results found.   Assessment and plan- Patient is a 62 y.o. male here for routine follow-up of pancytopenia secondary to cirrhosis and here for routine  follow-up  Patient has had stable chronic pancytopenia from his cirrhosis with a  white cell count that fluctuates between 2-4.  Hemoglobin has been normal.  Platelet counts over the last 2 years have been fluctuating between 50-70.  Patient was supposed to undergo epidural injections for his back pain but Dr. Clydene Fake sidedly wants his platelet count to be more than 75.  He received 2 units of platelet transfusion prior to his attempted procedure but platelets only went up to 52.  I did attempt to get him approval for avatrombopag which can be given electively 8 to 10 days prior to surgery to get his platelet counts up but this drug will only be approved if his platelet counts are less than 50 and on 2 separate occasions his platelet count have been in the 60s.  There is not much I can do at this time from my side to further improve his platelet counts.  What the ideal platelet count should be prior to epidural injections/procedures is widely debated.  Some consider platelet counts of more than 50 as adequate but it also depends on other factors.  Patient will be talking to [facility] if they have any further recommendations but from my standpoint I am unable to offer him anything further to increase his platelet counts.  I will continue to follow him every 6 months and see him back in 1 year   Visit Diagnosis 1. Thrombocytopenia (HCC)   2. Other cirrhosis of liver (HCC)      Dr. Owens Shark, MD, MPH [facility] [phone] 09/08/2022 7:51 AM

## 2022-09-27 IMAGING — US US ABDOMEN LIMITED
1 series · 14 of 25 positions shown · non-contrast
Comparison: US Abdomen, 12/07/2019. CT AP, 09/10/2020 and
05/13/2020.

CLINICAL DATA: HCC screening.

EXAM:
ULTRASOUND ABDOMEN LIMITED RIGHT UPPER QUADRANT

[Series 1: us abdomen limited · 0.26mm/px · 14 of 40 slices shown]
[im 1/40]
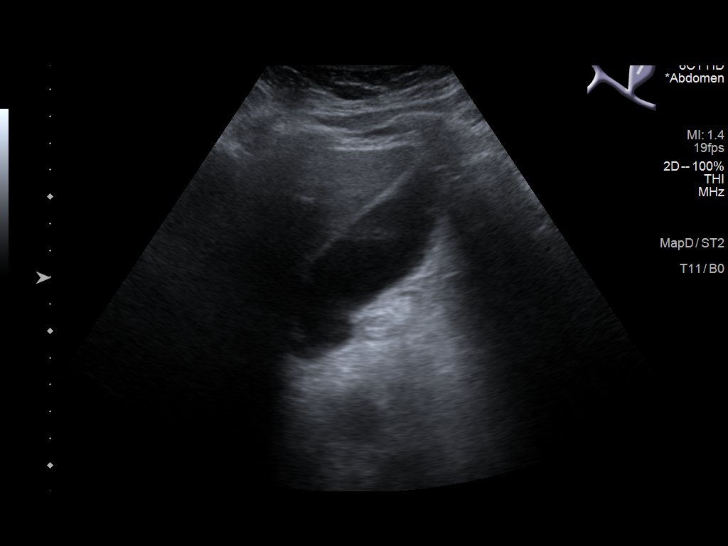
[im 4/40]
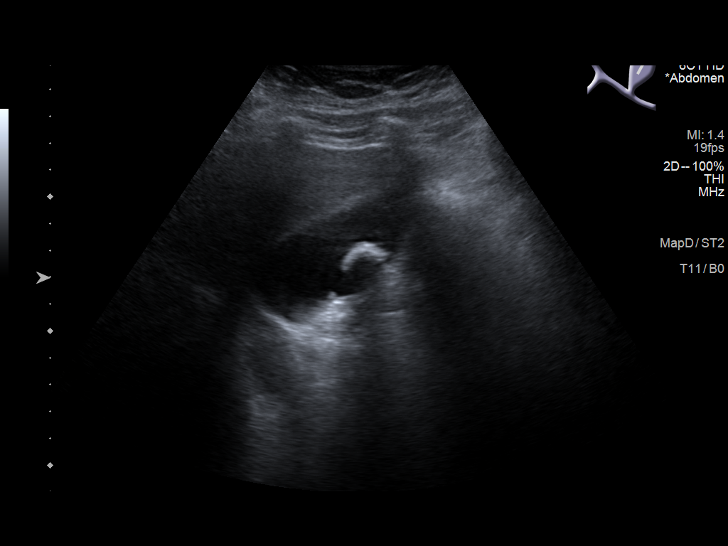
[im 7/40]
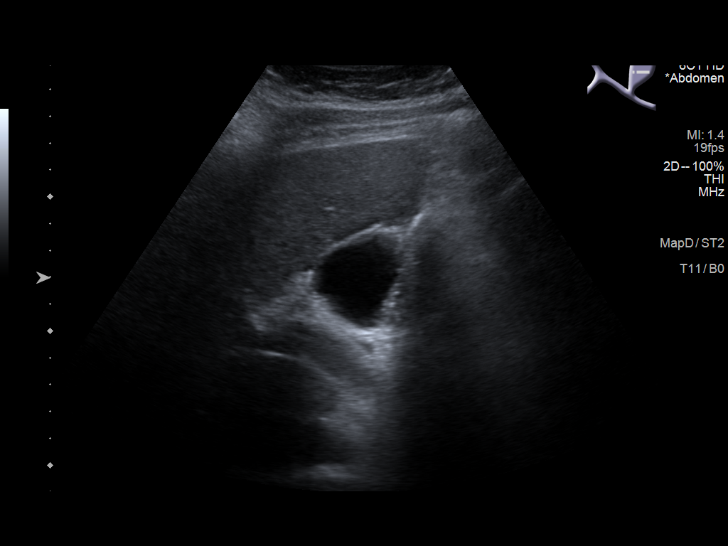
[im 10/40]
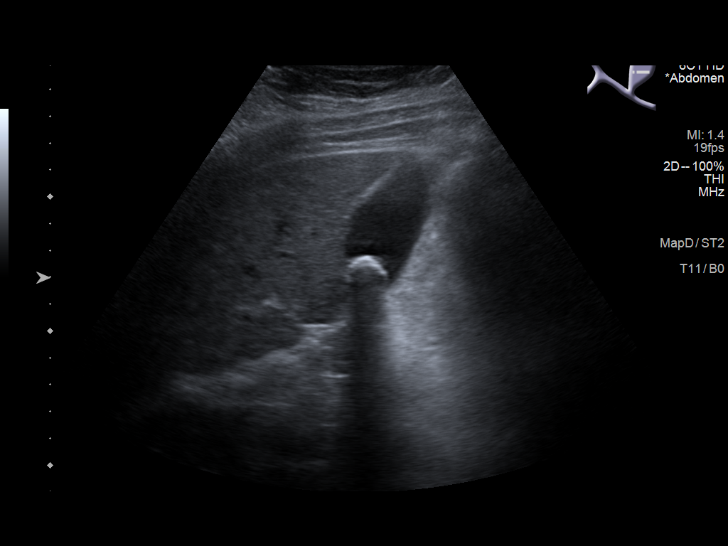
[im 14/40]
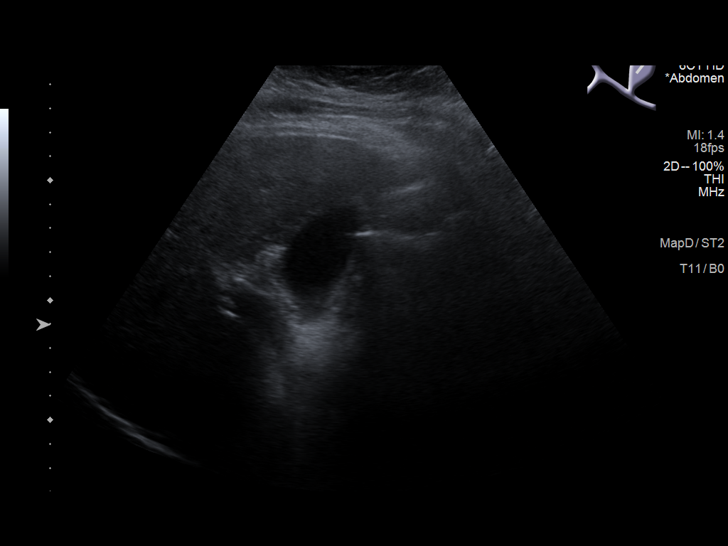
[im 15/40]
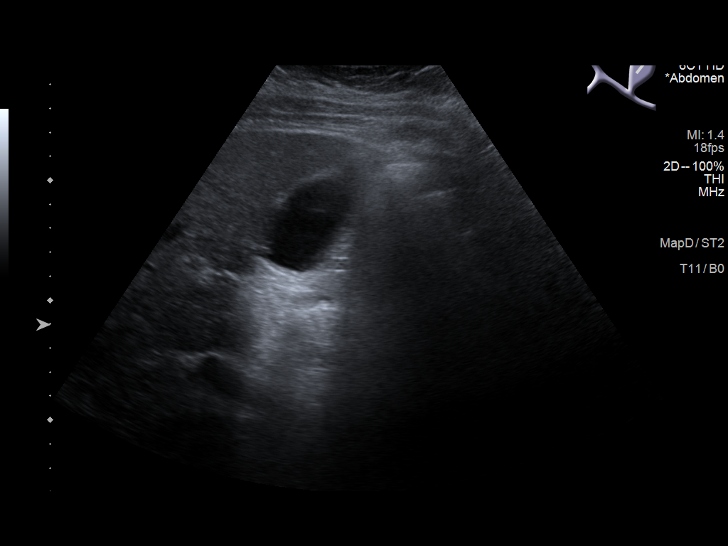
[im 18/40]
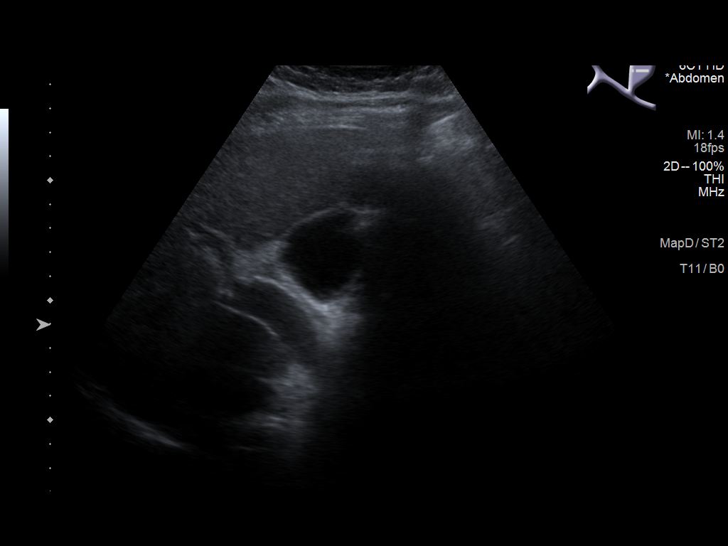
[im 22/40]
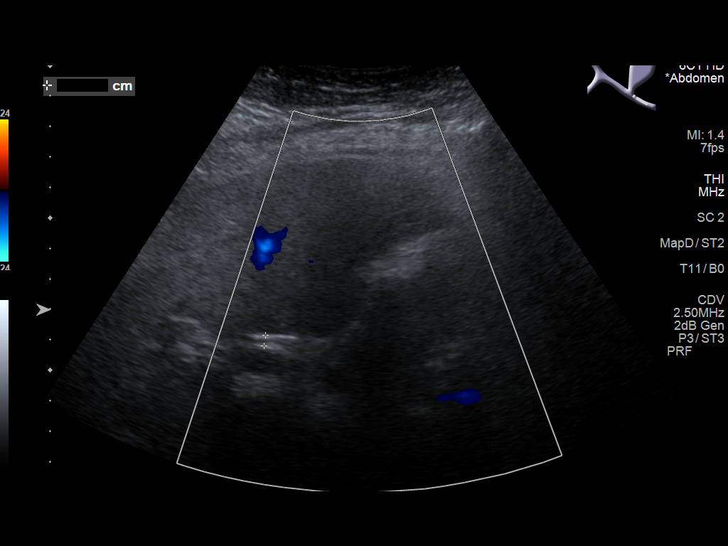
[im 25/40]
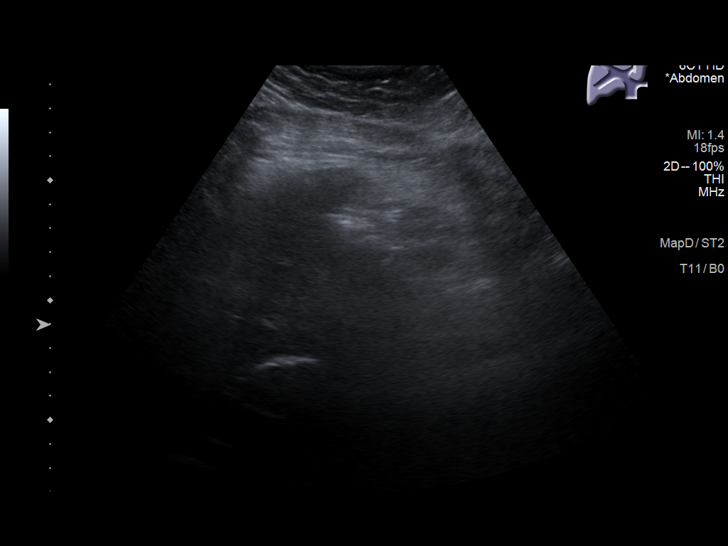
[im 27/40]
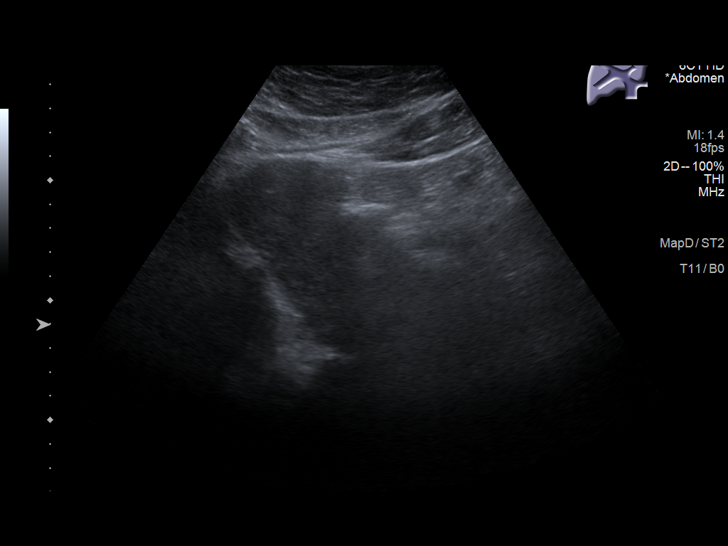
[im 30/40]
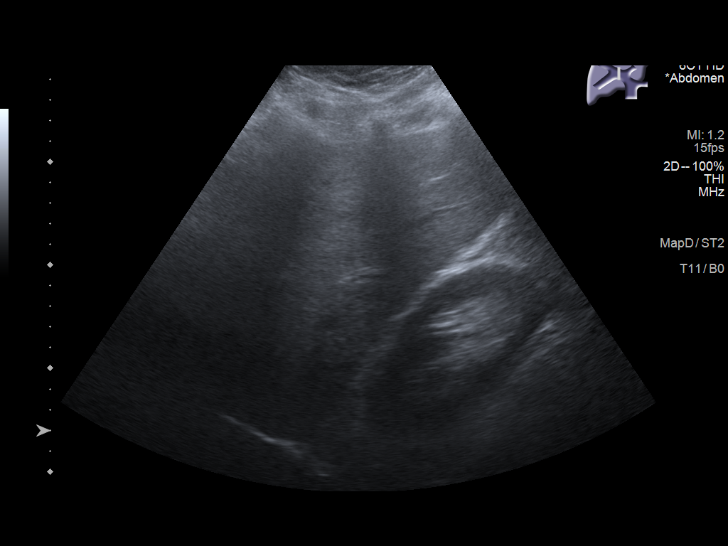
[im 33/40]
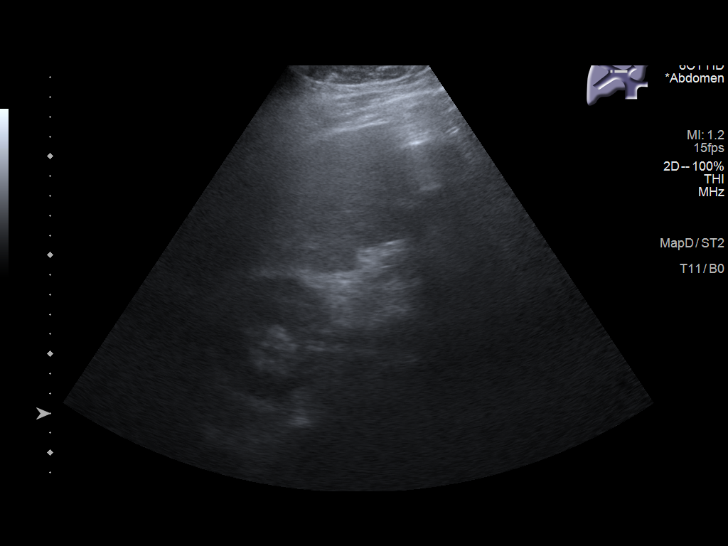
[im 36/40]
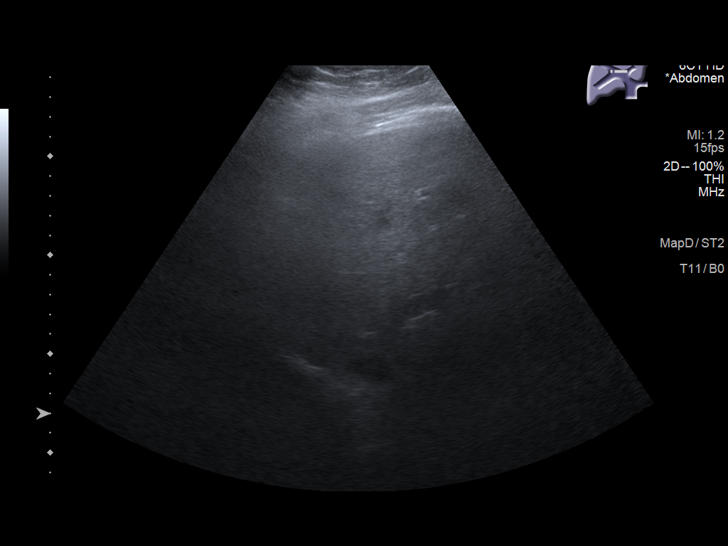
[im 40/40]
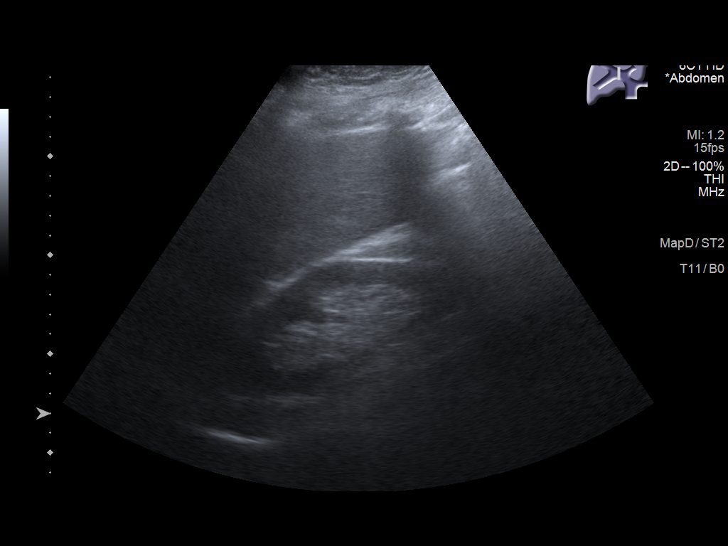

[14 of 25 positions shown; findings below may reference images not displayed]

FINDINGS: Suboptimal evaluation, with poor acoustic penetration secondary to
patient habitus

Gallbladder:

Mildly distended. Single dependent echogenic and shadowing gallstone
measuring up to 1.8 cm. No wall thickening. No sonographic Murphy
sign noted by sonographer.

Common bile duct:

Diameter: 0.4 cm

Liver:

No focal lesion identified. Increased hepatic parenchymal
echogenicity. Portal vein is patent on color Doppler imaging with
normal direction of blood flow towards the liver.

Other: No perihepatic ascites.
IMPRESSION: Suboptimal evaluation, within these constraints;

1. Cholelithiasis. No additional findings to suggest acute
cholecystitis.
2. Echogenic liver. Findings most commonly seen in hepatic
steatosis, though may also represent hepatitis and/or fibrosis.

## 2022-10-01 IMAGING — CR DG FINGER RING 2+V*L*
1 series · 3 of 3 positions shown · non-contrast
Comparison: None.

CLINICAL DATA: Trauma to the left ring finger.

EXAM:
LEFT RING FINGER 2+V

[Series 1: dg finger ring left · 0.14mm/px · 3 of 3 slices shown]
[im 1/3]
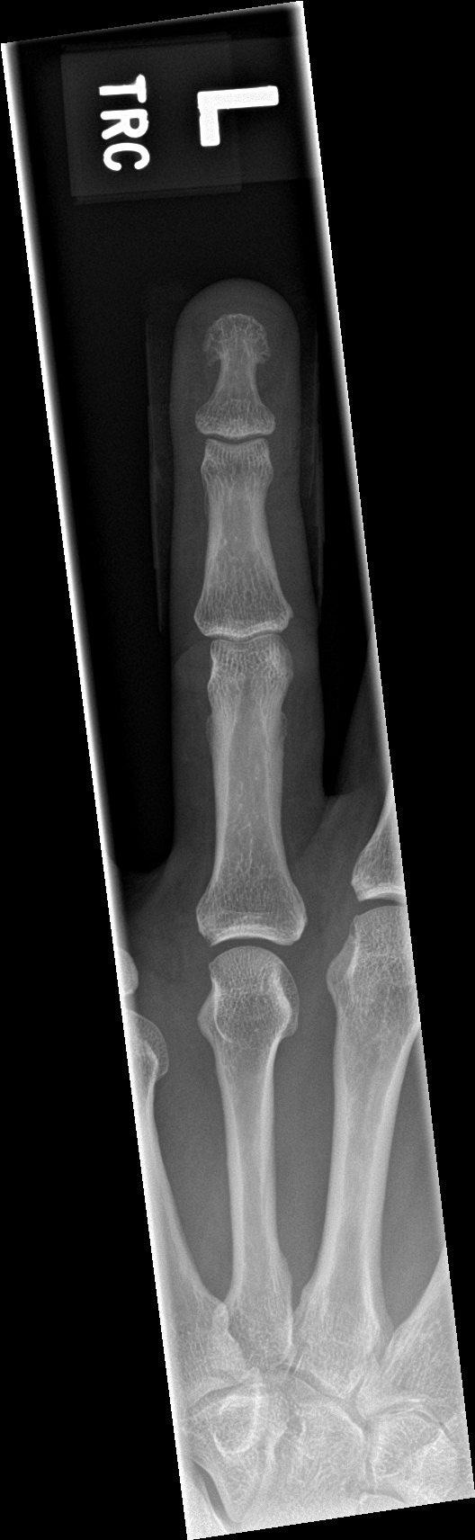
[im 2/3]
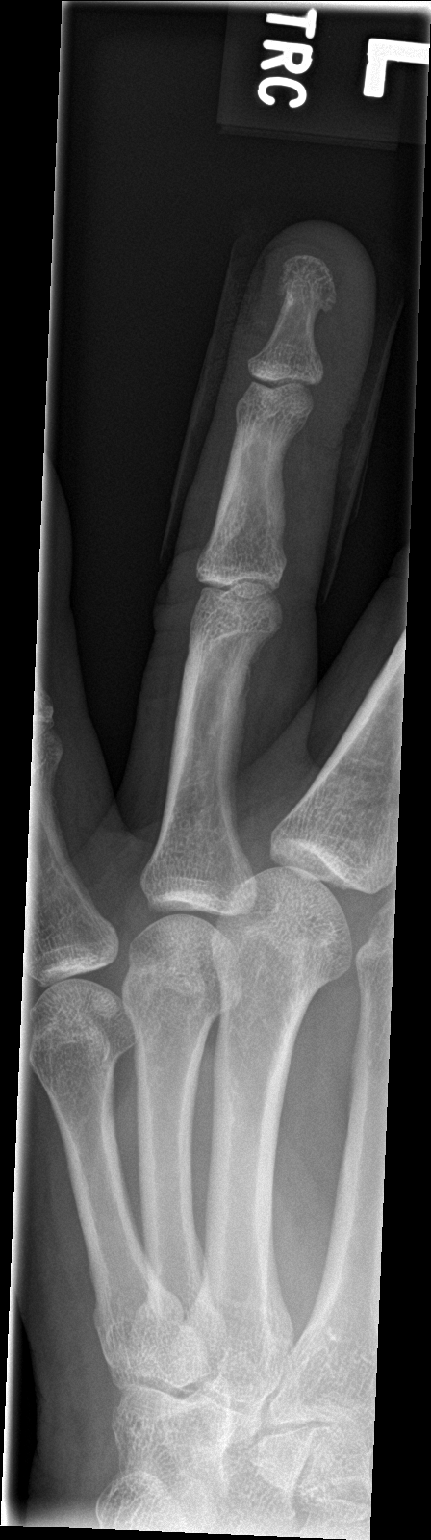
[im 3/3]
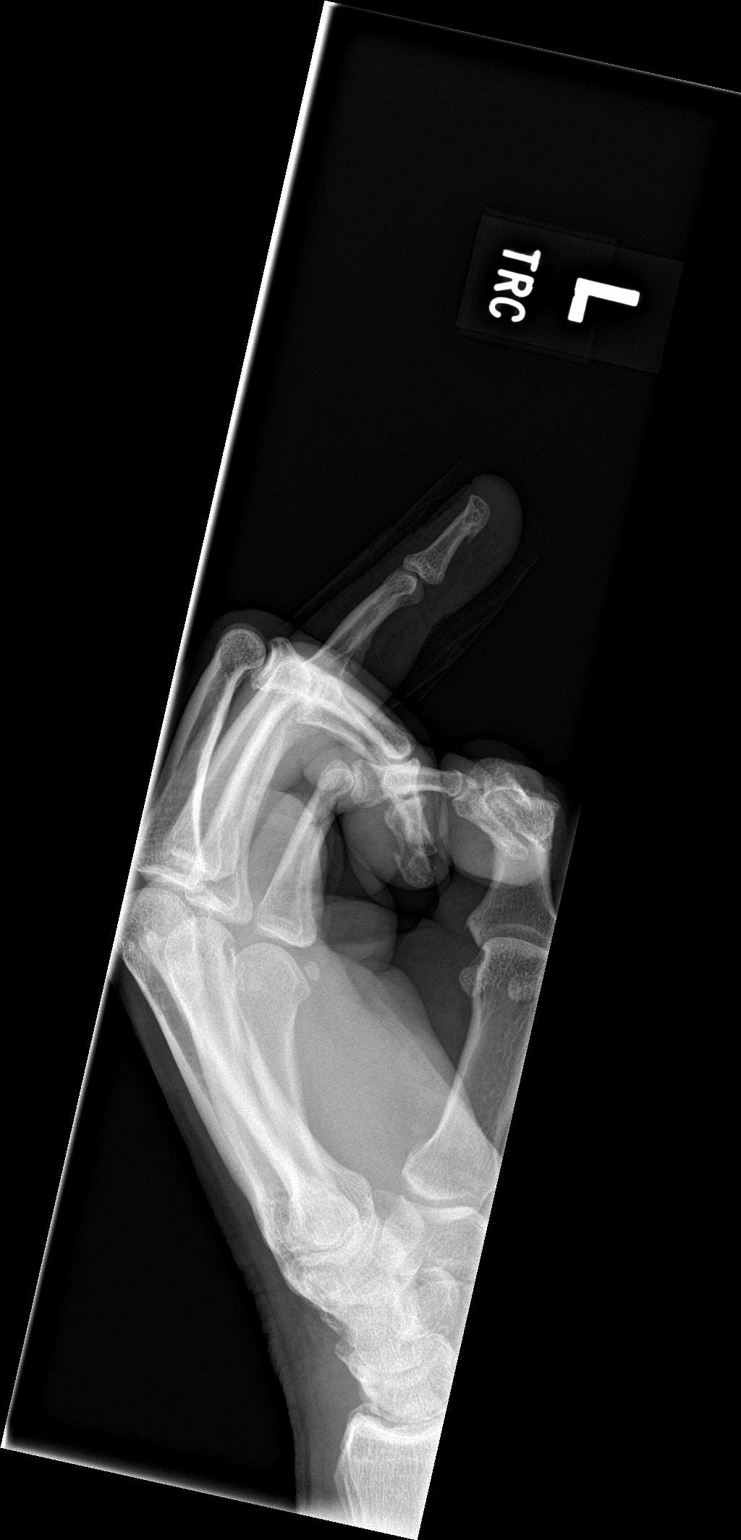

[3 of 3 positions shown; findings below may reference images not displayed]

FINDINGS: No acute fracture or dislocation. The bones are well mineralized. No
arthritic changes. The soft tissues are unremarkable. Dressing noted
over the fourth digit. No radiopaque foreign object or soft tissue
gas.
IMPRESSION: Negative.

## 2022-10-13 ENCOUNTER — Other Ambulatory Visit: Payer: BC Managed Care – PPO

## 2022-11-30 ENCOUNTER — Ambulatory Visit: Admit: 2022-11-30 | Discharge: 2022-12-01 | Payer: PRIVATE HEALTH INSURANCE

## 2022-11-30 DIAGNOSIS — K766 Portal hypertension: Principal | ICD-10-CM

## 2022-11-30 DIAGNOSIS — I8510 Secondary esophageal varices without bleeding: Principal | ICD-10-CM

## 2022-11-30 DIAGNOSIS — K7460 Unspecified cirrhosis of liver: Principal | ICD-10-CM

## 2022-11-30 DIAGNOSIS — K7581 Nonalcoholic steatohepatitis (NASH): Principal | ICD-10-CM

## 2023-01-03 ENCOUNTER — Ambulatory Visit: Admit: 2023-01-03 | Discharge: 2023-01-03 | Payer: BLUE CROSS/BLUE SHIELD

## 2023-01-04 DIAGNOSIS — K7460 Unspecified cirrhosis of liver: Principal | ICD-10-CM

## 2023-02-10 ENCOUNTER — Encounter: Payer: Self-pay | Admitting: Hematology and Oncology

## 2023-02-10 NOTE — Telephone Encounter (Signed)
 Error

## 2023-02-16 ENCOUNTER — Other Ambulatory Visit: Payer: Self-pay

## 2023-02-16 ENCOUNTER — Emergency Department (HOSPITAL_COMMUNITY): Payer: BC Managed Care – PPO

## 2023-02-16 ENCOUNTER — Encounter (HOSPITAL_COMMUNITY): Payer: Self-pay | Admitting: Emergency Medicine

## 2023-02-16 ENCOUNTER — Emergency Department (HOSPITAL_COMMUNITY)
Admission: EM | Admit: 2023-02-16 | Discharge: 2023-02-16 | Disposition: A | Discharge status: Home / Self Care | Payer: BC Managed Care – PPO | Attending: Emergency Medicine | Admitting: Emergency Medicine

## 2023-02-16 DIAGNOSIS — Z8719 Personal history of other diseases of the digestive system: Secondary | ICD-10-CM | POA: Insufficient documentation

## 2023-02-16 DIAGNOSIS — I10 Essential (primary) hypertension: Secondary | ICD-10-CM | POA: Diagnosis not present

## 2023-02-16 DIAGNOSIS — R002 Palpitations: Secondary | ICD-10-CM | POA: Diagnosis present

## 2023-02-16 DIAGNOSIS — Z79899 Other long term (current) drug therapy: Secondary | ICD-10-CM | POA: Insufficient documentation

## 2023-02-16 DIAGNOSIS — I4891 Unspecified atrial fibrillation: Secondary | ICD-10-CM | POA: Diagnosis not present

## 2023-02-16 DIAGNOSIS — D696 Thrombocytopenia, unspecified: Secondary | ICD-10-CM | POA: Insufficient documentation

## 2023-02-16 DIAGNOSIS — Z7984 Long term (current) use of oral hypoglycemic drugs: Secondary | ICD-10-CM | POA: Insufficient documentation

## 2023-02-16 DIAGNOSIS — E119 Type 2 diabetes mellitus without complications: Secondary | ICD-10-CM | POA: Diagnosis not present

## 2023-02-16 LAB — BASIC METABOLIC PANEL
Anion gap: 12 (ref 5–15)
BUN: 17 mg/dL (ref 8–23)
CO2: 22 mmol/L (ref 22–32)
Calcium: 9.4 mg/dL (ref 8.9–10.3)
Chloride: 104 mmol/L (ref 98–111)
Creatinine, Ser: 0.88 mg/dL (ref 0.61–1.24)
GFR, Estimated: >60 mL/min (ref >60)
Glucose, Bld: 195 mg/dL — ABNORMAL HIGH (ref 70–99)
Potassium: 3.7 mmol/L (ref 3.5–5.1)
Sodium: 138 mmol/L (ref 135–145)

## 2023-02-16 LAB — CBC
HCT: 48.7 % (ref 39.0–52.0)
Hemoglobin: 15.7 g/dL (ref 13.0–17.0)
MCH: 28.3 pg (ref 26.0–34.0)
MCHC: 32.2 g/dL (ref 30.0–36.0)
MCV: 87.7 fL (ref 80.0–100.0)
Platelets: 57 10*3/uL — ABNORMAL LOW (ref 150–400)
RBC: 5.55 MIL/uL (ref 4.22–5.81)
RDW: 15.7 % — ABNORMAL HIGH (ref 11.5–15.5)
WBC: 4.3 10*3/uL (ref 4.0–10.5)
nRBC: 0 % (ref 0.0–0.2)

## 2023-02-16 LAB — MAGNESIUM: Magnesium: 2 mg/dL (ref 1.7–2.4)

## 2023-02-16 LAB — T4, FREE: Free T4: 0.97 ng/dL (ref 0.61–1.12)

## 2023-02-16 LAB — TSH: TSH: 0.998 u[IU]/mL (ref 0.350–4.500)

## 2023-02-16 NOTE — ED Provider Triage Note (Signed)
 Emergency Medicine Provider Triage Evaluation Note  Juan Garza , a 63 y.o. male  was evaluated in triage.  Pt complains of Chest pain.  Felt like he was in afib.  Resolved in route now feels better.  Review of Systems  Positive: Cp, palpitations Negative:   Physical Exam  BP 133/86 (BP Location: Right Arm)   Pulse 96   Temp 98.2 F (36.8 C) (Oral)   Resp 18   Ht 6' 3 (1.905 m)   Wt 118.8 kg   SpO2 99%   BMI 32.74 kg/m  Gen:   Awake, no distress   Resp:  Normal effort  MSK:   Moves extremities without difficulty  Other:    Medical Decision Making  Medically screening exam initiated at 1:38 PM.  Appropriate orders placed.  Avrian Ram was informed that the remainder of the evaluation will be completed by another provider, this initial triage assessment does not replace that evaluation, and the importance of remaining in the ED until their evaluation is complete.  Patient with hx of afib.  Was in afib with ems, resolved enroute, now feels better.  Triggered by thyroid  in past.     Emil Share, DO 02/16/23 1339

## 2023-02-16 NOTE — Discharge Instructions (Signed)
 You were seen for your elevated heart rate (atrial fibrillation) in the emergency department.   At home, please take your coreg  and other medications.    Check your MyChart online for the results of any tests that had not resulted by the time you left the emergency department.   Follow-up with your primary doctor in 2-3 days regarding your visit.  Follow-up with your cardiologist in 1 week.   Return immediately to the emergency department if you experience any of the following: palpitations lasting more than 5 minutes, chest pain, or any other concerning symptoms.    Thank you for visiting our Emergency Department. It was a pleasure taking care of you today.

## 2023-02-16 NOTE — ED Triage Notes (Signed)
 Pt BIB EMS from work. Pt was having chest tightness. Pt hx of AFIB. HR was 160 upon EMS arrival. While EMS was putting on pt on monitor converted to pulse 90. Pt states he is feeling some better with som chest tightness.   CBG 245 137/78 HR 90 98% RA

## 2023-02-16 NOTE — ED Provider Notes (Signed)
 [place] EMERGENCY DEPARTMENT AT [facility] Provider Note   CSN: [id number] Arrival date & time: 02/16/23  1316     History  Chief Complaint  Patient presents with   Palpitations   Chest Pain    Juan Garza is a 63 y.o. male.  63 year old male history of hypertension, diabetes, cirrhosis, esophageal varices, thrombocytopenia, and paroxysmal atrial fibrillation who presents to the emergency department with palpitations.  Says that he woke up this morning and was feeling palpitations at approximately 4 AM.  Says that it lasted all day and had some chest discomfort that he describes as intermittent twinges and tightness.  Says that he can feel his heart palpating and that it felt like his prior episodes of atrial fibrillation with RVR.  His heart rate was in the 160s for EMS then he spontaneously converted to normal sinus rhythm.  Says he has been compliant with his medications.  No recent alcohol use.  Not on blood thinners because of his thrombocytopenia and liver disease.       Home Medications Prior to Admission medications   Medication Sig Start Date End Date Taking? Authorizing Provider  atorvastatin  (LIPITOR) 20 MG tablet Take 20 mg by mouth daily.   Yes [provider]  carvedilol  (COREG ) 12.5 MG tablet Take 12.5 mg by mouth 2 (two) times daily with a meal. 02/09/22  Yes [provider]  dutasteride  (AVODART ) 0.5 MG capsule Take 0.5 mg by mouth daily.   Yes [provider]  empagliflozin (JARDIANCE) 25 MG TABS tablet Take 25 mg by mouth daily.   Yes [provider]  furosemide  (LASIX ) 20 MG tablet Take 20 mg by mouth daily as needed for fluid or edema.   Yes [provider]  HYDROcodone-acetaminophen  (NORCO/VICODIN) 5-325 MG tablet Take 0.5-1 tablets by mouth 2 (two) times daily as needed for moderate pain (pain score 4-6).   Yes [provider]  lisinopril  (ZESTRIL ) 10 MG tablet Take 10 mg by mouth daily.   Yes  [provider]  methocarbamol  (ROBAXIN ) 750 MG tablet Take 750 mg by mouth every 8 (eight) hours as needed for muscle spasms.   Yes [provider]  omeprazole (PRILOSEC) 20 MG capsule Take 20 mg by mouth 2 (two) times daily before a meal.   Yes [provider]  pioglitazone (ACTOS) 15 MG tablet Take 15 mg by mouth daily.   Yes [provider]  pregabalin  (LYRICA ) 75 MG capsule Take 75 mg by mouth See admin instructions. Take 1 capsule (75mg ) by mouth every morning and take 2 capsules (150mg ) by mouth every night at bedtime   Yes [provider]  ondansetron  (ZOFRAN ) 8 MG tablet Take 8 mg by mouth every 8 (eight) hours as needed for nausea or vomiting.    [provider]  Semaglutide,0.25 or 0.5MG /DOS, (OZEMPIC, 0.25 OR 0.5 MG/DOSE,) 2 MG/1.5ML SOPN Inject 0.5 mg into the skin once a week. Last injection was on 01/11/2023,    [provider]      Allergies    Quetiapine, Sumatriptan, Nsaids, Seroquel [quetiapine fumarate], and Tape    Review of Systems   Review of Systems  Physical Exam Updated Vital Signs BP (!) 157/88   Pulse 79   Temp 98 F (36.7 C) (Oral)   Resp 20   Ht 6' 3 (1.905 m)   Wt 118.8 kg   SpO2 100%   BMI 32.74 kg/m  Physical Exam Vitals and nursing note reviewed.  Constitutional:  General: He is not in acute distress.    Appearance: He is well-developed.  HENT:     Head: Normocephalic and atraumatic.     Right Ear: External ear normal.     Left Ear: External ear normal.     Nose: Nose normal.  Eyes:     Extraocular Movements: Extraocular movements intact.     Conjunctiva/sclera: Conjunctivae normal.     Pupils: Pupils are equal, round, and reactive to light.  Cardiovascular:     Rate and Rhythm: Normal rate and regular rhythm.     Heart sounds: Normal heart sounds.  Pulmonary:     Effort: Pulmonary effort is normal. No respiratory distress.     Breath sounds: Normal breath sounds.   Musculoskeletal:     Cervical back: Normal range of motion and neck supple.     Right lower leg: No edema.     Left lower leg: No edema.  Skin:    General: Skin is warm and dry.  Neurological:     Mental Status: He is alert. Mental status is at baseline.  Psychiatric:        Mood and Affect: Mood normal.        Behavior: Behavior normal.     ED Results / Procedures / Treatments   Labs (all labs ordered are listed, but only abnormal results are displayed) Labs Reviewed  BASIC METABOLIC PANEL - Abnormal; Notable for the following components:      Result Value   Glucose, Bld 195 (*)    All other components within normal limits  CBC - Abnormal; Notable for the following components:   RDW 15.7 (*)    Platelets 57 (*)    All other components within normal limits  TSH  T4, FREE  MAGNESIUM     EKG EKG Interpretation Date/Time:  Wednesday February 16 2023 13:23:59 EST Ventricular Rate:  85 PR Interval:  158 QRS Duration:  86 QT Interval:  382 QTC Calculation: 454 R Axis:   8  Text Interpretation: Normal sinus rhythm Normal ECG No significant change since last tracing Confirmed by Emil Share [phone]) on 02/16/2023 1:25:32 PM  Radiology DG Chest 2 View Result Date: 02/16/2023 CLINICAL DATA:  Chest pain.  Atrial fibrillation. EXAM: CHEST - 2 VIEW COMPARISON:  01/28/2022 FINDINGS: Heart size is normal. Mediastinal shadows are normal. The lungs are clear. No infiltrate, collapse or effusion. No pulmonary edema. No significant bone finding. IMPRESSION: No active cardiopulmonary disease. Electronically Signed   By: Oneil Officer M.D.   On: 02/16/2023 15:35    Procedures Procedures    Medications Ordered in ED Medications - No data to display  ED Course/ Medical Decision Making/ A&P                                 Medical Decision Making Amount and/or Complexity of Data Reviewed Labs: ordered. Radiology: ordered.   Juan Garza is a 63 y.o. male with comorbidities that  complicate the patient evaluation including hypertension, diabetes, cirrhosis, esophageal varices, thrombocytopenia, and paroxysmal atrial fibrillation who presents to the emergency department with palpitations.   Initial Ddx:  Paroxysmal atrial fibrillation, arrhythmia, PE, anemia  MDM/Course:  Patient presents to the emergency department with palpitations today.  When EMS arrived he was tachycardic.  He says that it felt like prior episodes of atrial fibrillation which she has been diagnosed with.  Unfortunately I do not have a twelve-lead EKG that  showed his initial rhythm.  On exam he is overall well-appearing and back in a sinus rhythm currently.  Not having any chest pain or shortness of breath to suggest an MI or PE.  Lab work today showed that he is not anemic.  His electrolytes were all WNL.  He did report that at one point in time he thought his thyroid  functions may have been off so we did check them again today and they were normal as well.  Upon reevaluation patient remained stable.  Did discuss anticoagulation with the patient since he does have paroxysmal atrial fibrillation.  His CHA2DS2-VASc is 2 his has bled score is also 2.  Says that he is discussed this with his outpatient provider and it was felt that he is too high risk for anticoagulation.  Based on his risk factors agree that we will hold off on anticoagulation for coagulation for now coagulation for now.  Will have the patient follow-up with his primary doctor and cardiology.  This patient presents to the ED for concern of complaints listed in HPI, this involves an extensive number of treatment options, and is a complaint that carries with it a high risk of complications and morbidity. Disposition including potential need for admission considered.   Dispo: DC Home. Return precautions discussed including, but not limited to, those listed in the AVS. Allowed pt time to ask questions which were answered fully prior to  dc.  Additional history obtained from EMS Records reviewed Outpatient Clinic Notes The following labs were independently interpreted: Chemistry and show no acute abnormality I independently reviewed the following imaging with scope of interpretation limited to determining acute life threatening conditions related to emergency care: Chest x-ray and agree with the radiologist interpretation with the following exceptions: none I personally reviewed and interpreted cardiac monitoring: normal sinus rhythm  I personally reviewed and interpreted the pt's EKG: see above for interpretation  I have reviewed the patients home medications and made adjustments as needed  Portions of this note were generated with Dragon dictation software. Dictation errors may occur despite best attempts at proofreading.     Final Clinical Impression(s) / ED Diagnoses Final diagnoses:  Atrial fibrillation with RVR (HCC)  History of cirrhosis  Thrombocytopenia (HCC)    Rx / DC Orders ED Discharge Orders     None         Yolande Lamar BROCKS, MD 02/18/23 1401

## 2023-03-10 ENCOUNTER — Other Ambulatory Visit: Payer: BC Managed Care – PPO

## 2023-03-10 ENCOUNTER — Other Ambulatory Visit: Payer: Self-pay

## 2023-03-10 ENCOUNTER — Inpatient Hospital Stay: Payer: BC Managed Care – PPO | Attending: Oncology

## 2023-03-10 DIAGNOSIS — G43009 Migraine without aura, not intractable, without status migrainosus: Secondary | ICD-10-CM

## 2023-03-10 DIAGNOSIS — D696 Thrombocytopenia, unspecified: Secondary | ICD-10-CM | POA: Diagnosis present

## 2023-03-10 DIAGNOSIS — D61818 Other pancytopenia: Secondary | ICD-10-CM

## 2023-03-10 LAB — CBC WITH DIFFERENTIAL/PLATELET
Abs Immature Granulocytes: 0.01 10*3/uL (ref 0.00–0.07)
Basophils Absolute: 0 10*3/uL (ref 0.0–0.1)
Basophils Relative: 1 %
Eosinophils Absolute: 0 10*3/uL (ref 0.0–0.5)
Eosinophils Relative: 1 %
HCT: 41.5 % (ref 39.0–52.0)
Hemoglobin: 13.8 g/dL (ref 13.0–17.0)
Immature Granulocytes: 0 %
Lymphocytes Relative: 25 %
Lymphs Abs: 0.7 10*3/uL (ref 0.7–4.0)
MCH: 29 pg (ref 26.0–34.0)
MCHC: 33.3 g/dL (ref 30.0–36.0)
MCV: 87.2 fL (ref 80.0–100.0)
Monocytes Absolute: 0.3 10*3/uL (ref 0.1–1.0)
Monocytes Relative: 9 %
Neutro Abs: 1.8 10*3/uL (ref 1.7–7.7)
Neutrophils Relative %: 64 %
Platelets: 43 10*3/uL — ABNORMAL LOW (ref 150–400)
RBC: 4.76 MIL/uL (ref 4.22–5.81)
RDW: 15.8 % — ABNORMAL HIGH (ref 11.5–15.5)
WBC: 2.9 10*3/uL — ABNORMAL LOW (ref 4.0–10.5)
nRBC: 0 % (ref 0.0–0.2)

## 2023-03-11 LAB — AFP TUMOR MARKER: AFP, Serum, Tumor Marker: 2.4 ng/mL (ref 0.0–8.4)

## 2023-04-12 ENCOUNTER — Other Ambulatory Visit: Payer: BC Managed Care – PPO

## 2023-04-12 ENCOUNTER — Ambulatory Visit: Payer: BC Managed Care – PPO | Admitting: Oncology

## 2023-05-03 ENCOUNTER — Emergency Department

## 2023-05-03 ENCOUNTER — Other Ambulatory Visit: Payer: Self-pay

## 2023-05-03 ENCOUNTER — Emergency Department
Admission: EM | Admit: 2023-05-03 | Discharge: 2023-05-03 | Disposition: A | Discharge status: Home / Self Care | Attending: Emergency Medicine | Admitting: Emergency Medicine

## 2023-05-03 ENCOUNTER — Encounter: Payer: Self-pay | Admitting: Emergency Medicine

## 2023-05-03 DIAGNOSIS — R1030 Lower abdominal pain, unspecified: Secondary | ICD-10-CM | POA: Diagnosis not present

## 2023-05-03 DIAGNOSIS — R300 Dysuria: Secondary | ICD-10-CM | POA: Insufficient documentation

## 2023-05-03 DIAGNOSIS — R0789 Other chest pain: Secondary | ICD-10-CM | POA: Insufficient documentation

## 2023-05-03 LAB — URINALYSIS, ROUTINE W REFLEX MICROSCOPIC
Bacteria, UA: NONE SEEN
Bilirubin Urine: NEGATIVE
Glucose, UA: >=500 mg/dL — AB
Hgb urine dipstick: NEGATIVE
Ketones, ur: NEGATIVE mg/dL
Leukocytes,Ua: NEGATIVE
Nitrite: NEGATIVE
Protein, ur: NEGATIVE mg/dL
Specific Gravity, Urine: 1.04 — ABNORMAL HIGH (ref 1.005–1.030)
Squamous Epithelial / HPF: 0 /HPF (ref 0–5)
pH: 6 (ref 5.0–8.0)

## 2023-05-03 LAB — LIPASE, BLOOD: Lipase: 38 U/L (ref 11–51)

## 2023-05-03 LAB — CBC
HCT: 42.9 % (ref 39.0–52.0)
Hemoglobin: 15.1 g/dL (ref 13.0–17.0)
MCH: 30.2 pg (ref 26.0–34.0)
MCHC: 35.2 g/dL (ref 30.0–36.0)
MCV: 85.8 fL (ref 80.0–100.0)
Platelets: 47 10*3/uL — ABNORMAL LOW (ref 150–400)
RBC: 5 MIL/uL (ref 4.22–5.81)
RDW: 14.7 % (ref 11.5–15.5)
WBC: 4.1 10*3/uL (ref 4.0–10.5)
nRBC: 0 % (ref 0.0–0.2)

## 2023-05-03 LAB — HEPATIC FUNCTION PANEL
ALT: 38 U/L (ref 0–44)
AST: 47 U/L — ABNORMAL HIGH (ref 15–41)
Albumin: 3.6 g/dL (ref 3.5–5.0)
Alkaline Phosphatase: 51 U/L (ref 38–126)
Bilirubin, Direct: 0.5 mg/dL — ABNORMAL HIGH (ref 0.0–0.2)
Indirect Bilirubin: 2.1 mg/dL — ABNORMAL HIGH (ref 0.3–0.9)
Total Bilirubin: 2.6 mg/dL — ABNORMAL HIGH (ref 0.0–1.2)
Total Protein: 6.3 g/dL — ABNORMAL LOW (ref 6.5–8.1)

## 2023-05-03 LAB — BASIC METABOLIC PANEL
Anion gap: 6 (ref 5–15)
BUN: 16 mg/dL (ref 8–23)
CO2: 24 mmol/L (ref 22–32)
Calcium: 9 mg/dL (ref 8.9–10.3)
Chloride: 105 mmol/L (ref 98–111)
Creatinine, Ser: 0.55 mg/dL — ABNORMAL LOW (ref 0.61–1.24)
GFR, Estimated: >60 mL/min (ref >60)
Glucose, Bld: 333 mg/dL — ABNORMAL HIGH (ref 70–99)
Potassium: 4.7 mmol/L (ref 3.5–5.1)
Sodium: 135 mmol/L (ref 135–145)

## 2023-05-03 LAB — TROPONIN I (HIGH SENSITIVITY)
Troponin I (High Sensitivity): 5 ng/L (ref <18)
Troponin I (High Sensitivity): 4 ng/L (ref <18)

## 2023-05-03 MED ORDER — IOHEXOL 350 MG/ML SOLN
100.0000 mL | Freq: Once | INTRAVENOUS | Status: AC | PRN
  Administered 2023-05-03: 100 mL via INTRAVENOUS

## 2023-05-03 NOTE — ED Provider Notes (Signed)
 [facility] Provider Note    Event Date/Time   First MD Initiated Contact with Patient 05/03/23 1015     (approximate)   History   Chest Pain   HPI  Juan Garza is a 63 y.o. male who presents to the ED for evaluation of Chest Pain   I review a cardiology clinic visit from this past January.  History of Nash cirrhosis, A-fib, myelodysplastic syndrome.  Not on anticoagulation due to poor anticoagulant candidate in the setting of pancytopenia and cirrhosis.  GERD.  Patient presents to the ED with multiple complaints.  Merrily reporting sharp left-sided intermittent chest pain over the past month.  He was at work this morning when coworkers noticed him holding his chest and they "made me" come in to get evaluated.  Reports sharp discomfort that lasts minutes-hours nearly every day over the past 1 month.  Also reporting concern that he might have a UTI.  Reports 3 to 4 days of dysuria, lower abdominal pain.  No stool changes.  No emesis.  No fevers or flank pain.   Physical Exam   Triage Vital Signs: ED Triage Vitals [05/03/23 1008]  Encounter Vitals Group     BP 120/64     Systolic BP Percentile      Diastolic BP Percentile      Pulse Rate 62     Resp 18     Temp 98.1 F (36.7 C)     Temp Source Oral     SpO2 99 %     Weight 282 lb (127.9 kg)     Height 6\' 3"  (1.905 m)     Head Circumference      Peak Flow      Pain Score 4     Pain Loc      Pain Education      Exclude from Growth Chart     Most recent vital signs: Vitals:   05/03/23 1330 05/03/23 1443  BP: 126/74 136/85  Pulse: (!) 58 60  Resp: 16   Temp: 98 F (36.7 C)   SpO2: 98% 100%    General: Awake, no distress.  Obese, fairly well-appearing, ambulatory. CV:  Good peripheral perfusion.  Resp:  Normal effort.  Abd:  No distention.  MSK:  No deformity noted.  Neuro:  No focal deficits appreciated. Other:     ED Results / Procedures / Treatments   Labs (all labs ordered  are listed, but only abnormal results are displayed) Labs Reviewed  BASIC METABOLIC PANEL - Abnormal; Notable for the following components:      Result Value   Glucose, Bld 333 (*)    Creatinine, Ser 0.55 (*)    All other components within normal limits  CBC - Abnormal; Notable for the following components:   Platelets 47 (*)    All other components within normal limits  URINALYSIS, ROUTINE W REFLEX MICROSCOPIC - Abnormal; Notable for the following components:   Color, Urine YELLOW (*)    APPearance CLEAR (*)    Specific Gravity, Urine 1.040 (*)    Glucose, UA >=500 (*)    All other components within normal limits  HEPATIC FUNCTION PANEL - Abnormal; Notable for the following components:   Total Protein 6.3 (*)    AST 47 (*)    Total Bilirubin 2.6 (*)    Bilirubin, Direct 0.5 (*)    Indirect Bilirubin 2.1 (*)    All other components within normal limits  LIPASE, BLOOD  TROPONIN  I (HIGH SENSITIVITY)  TROPONIN I (HIGH SENSITIVITY)    EKG Sinus rhythm with a rate of 60 bpm.  Normal axis and intervals.  No evidence of acute ischemia.  RADIOLOGY CTA chest interpreted by me without PE CT abdomen/pelvis interpreted me with gallstones without signs of cholecystitis or other acute pathology.  Official radiology report(s): CT Angio Chest PE W and/or Wo Contrast Result Date: 05/03/2023 CLINICAL DATA:  eval PE. cirrhotic and Afib not on AC, chest pain; lower abd pain, dysuria. hx cirrhosis. tender LLQ. EXAM: CT ANGIOGRAPHY CHEST CT ABDOMEN AND PELVIS WITH CONTRAST TECHNIQUE: Multidetector CT imaging of the chest was performed using the standard protocol during bolus administration of intravenous contrast. Multiplanar CT image reconstructions and MIPs were obtained to evaluate the vascular anatomy. Multidetector CT imaging of the abdomen and pelvis was performed using the standard protocol during bolus administration of intravenous contrast. RADIATION DOSE REDUCTION: This exam was performed  according to the departmental dose-optimization program which includes automated exposure control, adjustment of the mA and/or kV according to patient size and/or use of iterative reconstruction technique. CONTRAST:  OMNIPAQUE IOHEXOL 350 MG/ML SOLN COMPARISON:  CT scan abdomen and pelvis from 10/22/2021 and CT calcium scoring from 03/22/2022. FINDINGS: CTA CHEST FINDINGS Cardiovascular: No evidence of embolism to the proximal subsegmental pulmonary artery level. Normal cardiac size. No pericardial effusion. No aortic aneurysm. Mediastinum/Nodes: Visualized thyroid gland appears grossly unremarkable. No solid / cystic mediastinal masses. The esophagus is nondistended precluding optimal assessment. There is mild circumferential thickening of the lower thoracic esophagus, which is most likely seen in the settings of chronic gastroesophageal reflux disease versus esophagitis. No axillary, mediastinal or hilar lymphadenopathy by size criteria. Lungs/Pleura: The central tracheo-bronchial tree is patent. No mass or consolidation. No pleural effusion or pneumothorax. No suspicious lung nodules. Musculoskeletal: The visualized soft tissues of the chest wall are grossly unremarkable. No suspicious osseous lesions. There are mild multilevel degenerative changes in the visualized spine. Review of the MIP images confirms the above findings. CT ABDOMEN and PELVIS FINDINGS Hepatobiliary: The liver is normal in size. There is liver surface nodularity, compatible with cirrhosis. These is diffuse hepatic steatosis. No suspicious mass. Note is made of a subcentimeter hypoattenuating focus in the right hepatic lobe, segment 5, which is too small to adequately characterize. No intrahepatic or extrahepatic bile duct dilation. There is a single 1.9 cm gallstone without imaging signs of acute cholecystitis. Normal gallbladder wall thickness. No pericholecystic inflammatory changes. Pancreas: Unremarkable. No pancreatic ductal  dilatation or surrounding inflammatory changes. Spleen: Spleen is enlarged measuring upto 11.4 x 15.9 cm orthogonally on coronal plane. No focal lesion. Adrenals/Urinary Tract: Adrenal glands are unremarkable. No suspicious renal mass. No hydronephrosis. No renal or ureteric calculi. Urinary bladder is under distended, precluding optimal assessment. However, no large mass or stones identified. No perivesical fat stranding. Stomach/Bowel: Status post right hemicolectomy with ileocolonic anastomosis in the right mid abdomen. No disproportionate dilation of the small or large bowel loops. No evidence of abnormal bowel wall thickening or inflammatory changes. Vascular/Lymphatic: There is small amount of ascites mainly in the dependent pelvis. No pneumoperitoneum. No abdominal or pelvic lymphadenopathy, by size criteria. No aneurysmal dilation of the major abdominal arteries. There are mild peripheral atherosclerotic vascular calcifications of the aorta and its major branches. There are multiple venous collaterals along the lower thoracic esophagus/GE junction region as well as mild perisplenic venous collaterals, compatible with sequela of portal hypertension. Reproductive: Normal size prostate. Symmetric seminal vesicles. Other: There is small fat  containing periumbilical hernia. There is periumbilical and infraumbilical midline surgical scar. The soft tissues and abdominal wall are otherwise unremarkable. Musculoskeletal: No suspicious osseous lesions. There are mild - moderate multilevel degenerative changes in the visualized spine. L3 through L5 posterior spinal fixation noted. Review of the MIP images confirms the above findings. IMPRESSION: 1. No embolism to the proximal subsegmental pulmonary artery level. 2. No acute inflammatory process identified within the chest, abdomen or pelvis. 3. Multiple other nonacute observations (such as cirrhotic liver, cholelithiasis without acute cholecystitis, status post right  hemicolectomy, small ascites, periesophageal - GE junction and perisplenic venous collaterals, moderate splenomegaly, etc.), As described above. Aortic Atherosclerosis (ICD10-I70.0). Electronically Signed   By: Jules Schick M.D.   On: 05/03/2023 12:59   CT ABDOMEN PELVIS W CONTRAST Result Date: 05/03/2023 CLINICAL DATA:  eval PE. cirrhotic and Afib not on AC, chest pain; lower abd pain, dysuria. hx cirrhosis. tender LLQ. EXAM: CT ANGIOGRAPHY CHEST CT ABDOMEN AND PELVIS WITH CONTRAST TECHNIQUE: Multidetector CT imaging of the chest was performed using the standard protocol during bolus administration of intravenous contrast. Multiplanar CT image reconstructions and MIPs were obtained to evaluate the vascular anatomy. Multidetector CT imaging of the abdomen and pelvis was performed using the standard protocol during bolus administration of intravenous contrast. RADIATION DOSE REDUCTION: This exam was performed according to the departmental dose-optimization program which includes automated exposure control, adjustment of the mA and/or kV according to patient size and/or use of iterative reconstruction technique. CONTRAST:  OMNIPAQUE IOHEXOL 350 MG/ML SOLN COMPARISON:  CT scan abdomen and pelvis from 10/22/2021 and CT calcium scoring from 03/22/2022. FINDINGS: CTA CHEST FINDINGS Cardiovascular: No evidence of embolism to the proximal subsegmental pulmonary artery level. Normal cardiac size. No pericardial effusion. No aortic aneurysm. Mediastinum/Nodes: Visualized thyroid gland appears grossly unremarkable. No solid / cystic mediastinal masses. The esophagus is nondistended precluding optimal assessment. There is mild circumferential thickening of the lower thoracic esophagus, which is most likely seen in the settings of chronic gastroesophageal reflux disease versus esophagitis. No axillary, mediastinal or hilar lymphadenopathy by size criteria. Lungs/Pleura: The central tracheo-bronchial tree is patent. No  mass or consolidation. No pleural effusion or pneumothorax. No suspicious lung nodules. Musculoskeletal: The visualized soft tissues of the chest wall are grossly unremarkable. No suspicious osseous lesions. There are mild multilevel degenerative changes in the visualized spine. Review of the MIP images confirms the above findings. CT ABDOMEN and PELVIS FINDINGS Hepatobiliary: The liver is normal in size. There is liver surface nodularity, compatible with cirrhosis. These is diffuse hepatic steatosis. No suspicious mass. Note is made of a subcentimeter hypoattenuating focus in the right hepatic lobe, segment 5, which is too small to adequately characterize. No intrahepatic or extrahepatic bile duct dilation. There is a single 1.9 cm gallstone without imaging signs of acute cholecystitis. Normal gallbladder wall thickness. No pericholecystic inflammatory changes. Pancreas: Unremarkable. No pancreatic ductal dilatation or surrounding inflammatory changes. Spleen: Spleen is enlarged measuring upto 11.4 x 15.9 cm orthogonally on coronal plane. No focal lesion. Adrenals/Urinary Tract: Adrenal glands are unremarkable. No suspicious renal mass. No hydronephrosis. No renal or ureteric calculi. Urinary bladder is under distended, precluding optimal assessment. However, no large mass or stones identified. No perivesical fat stranding. Stomach/Bowel: Status post right hemicolectomy with ileocolonic anastomosis in the right mid abdomen. No disproportionate dilation of the small or large bowel loops. No evidence of abnormal bowel wall thickening or inflammatory changes. Vascular/Lymphatic: There is small amount of ascites mainly in the dependent pelvis. No pneumoperitoneum.  No abdominal or pelvic lymphadenopathy, by size criteria. No aneurysmal dilation of the major abdominal arteries. There are mild peripheral atherosclerotic vascular calcifications of the aorta and its major branches. There are multiple venous collaterals  along the lower thoracic esophagus/GE junction region as well as mild perisplenic venous collaterals, compatible with sequela of portal hypertension. Reproductive: Normal size prostate. Symmetric seminal vesicles. Other: There is small fat containing periumbilical hernia. There is periumbilical and infraumbilical midline surgical scar. The soft tissues and abdominal wall are otherwise unremarkable. Musculoskeletal: No suspicious osseous lesions. There are mild - moderate multilevel degenerative changes in the visualized spine. L3 through L5 posterior spinal fixation noted. Review of the MIP images confirms the above findings. IMPRESSION: 1. No embolism to the proximal subsegmental pulmonary artery level. 2. No acute inflammatory process identified within the chest, abdomen or pelvis. 3. Multiple other nonacute observations (such as cirrhotic liver, cholelithiasis without acute cholecystitis, status post right hemicolectomy, small ascites, periesophageal - GE junction and perisplenic venous collaterals, moderate splenomegaly, etc.), As described above. Aortic Atherosclerosis (ICD10-I70.0). Electronically Signed   By: Jules Schick M.D.   On: 05/03/2023 12:59   DG Chest 2 View Result Date: 05/03/2023 CLINICAL DATA:  Chest pain. EXAM: CHEST - 2 VIEW COMPARISON:  02/16/2023. FINDINGS: Bilateral lung fields are clear. Bilateral costophrenic angles are clear. Normal cardio-mediastinal silhouette. No acute osseous abnormalities. The soft tissues are within normal limits. IMPRESSION: No active cardiopulmonary disease. Electronically Signed   By: Jules Schick M.D.   On: 05/03/2023 12:45    PROCEDURES and INTERVENTIONS:  .1-3 Lead EKG Interpretation  Performed by: Delton Prairie, MD Authorized by: Delton Prairie, MD     Interpretation: normal     ECG rate:  62   ECG rate assessment: normal     Rhythm: sinus rhythm     Ectopy: none     Conduction: normal     Medications  iohexol (OMNIPAQUE) 350 MG/ML  injection 100 mL (100 mLs Intravenous Contrast Given 05/03/23 1102)     IMPRESSION / MDM / ASSESSMENT AND PLAN / ED COURSE  I reviewed the triage vital signs and the nursing notes.  Differential diagnosis includes, but is not limited to, ACS, PTX, PNA, muscle strain/spasm, PE, dissection, anxiety, pleural effusion  {Patient presents with symptoms of an acute illness or injury that is potentially life-threatening.  Patient presents with atypical chest discomfort and lower abdominal discomfort without evidence of acute pathology and suitable for outpatient management.  Nonischemic EKG and 2 negative troponins.  I did was underlying coagulopathy with cirrhosis, A-fib and thrombocytopenia perform CTA chest evaluate for PE which is negative and delayed follow-through of the abdomen due to his lower abdominal discomfort without evidence of acute pathology.  Chronic LFT derangements from his cirrhosis, urine without infectious features.  Hyperglycemic without signs of acidosis.  Ultimately suitable for outpatient management.  I considered observation admission for this patient.  Clinical Course as of 05/03/23 1522  Tue May 03, 2023  1332 Reassessed.  He reports feeling well.  Discussed plan of care.  Second troponin and hopefully discharge [DS]    Clinical Course User Index [DS] Delton Prairie, MD     FINAL CLINICAL IMPRESSION(S) / ED DIAGNOSES   Final diagnoses:  Other chest pain     Rx / DC Orders   ED Discharge Orders     None        Note:  This document was prepared using Dragon voice recognition software and may include unintentional dictation errors.  Delton Prairie, MD 05/03/23 [phone]

## 2023-05-03 NOTE — ED Triage Notes (Signed)
 Patient to ED via [facility] from work for CP that started around 7am. States pain on left center. Hx afib

## 2023-05-03 NOTE — ED Notes (Signed)
 First Nurse Note: Pt to ED via [facility] from work for chest pain. Pt states that the pain started around 0700 and is a sharp pain on the left side of his chest. Pt has hx/o A. Fib but is not on any medication for it. Pt was given 1 nitroglycerin and pt was given a second nitroglycerin by EMS. Pt cannot take aspirin due low platelet count. Pt has 20 G IV Right forearm. Pt has hx/o DM and did not take his insulin, pts CBG 340.

## 2023-05-23 ENCOUNTER — Inpatient Hospital Stay: Admit: 2023-05-23 | Discharge: 2023-05-24 | Payer: BLUE CROSS/BLUE SHIELD

## 2023-05-23 DIAGNOSIS — K7460 Unspecified cirrhosis of liver: Principal | ICD-10-CM

## 2023-05-31 ENCOUNTER — Ambulatory Visit: Admit: 2023-05-31 | Discharge: 2023-06-01 | Payer: BLUE CROSS/BLUE SHIELD

## 2023-05-31 DIAGNOSIS — K7581 Nonalcoholic steatohepatitis (NASH): Principal | ICD-10-CM

## 2023-05-31 DIAGNOSIS — K766 Portal hypertension: Principal | ICD-10-CM

## 2023-05-31 DIAGNOSIS — K7460 Unspecified cirrhosis of liver: Principal | ICD-10-CM

## 2023-05-31 DIAGNOSIS — I8510 Secondary esophageal varices without bleeding: Principal | ICD-10-CM

## 2023-08-11 ENCOUNTER — Ambulatory Visit: Admitting: Oncology

## 2023-08-11 ENCOUNTER — Other Ambulatory Visit

## 2023-09-07 ENCOUNTER — Ambulatory Visit: Payer: BC Managed Care – PPO | Admitting: Oncology

## 2023-09-07 ENCOUNTER — Other Ambulatory Visit: Payer: BC Managed Care – PPO

## 2023-09-30 DIAGNOSIS — K7460 Unspecified cirrhosis of liver: Principal | ICD-10-CM

## 2023-10-03 ENCOUNTER — Inpatient Hospital Stay: Admit: 2023-10-03 | Discharge: 2023-10-03 | Payer: BLUE CROSS/BLUE SHIELD

## 2023-10-03 DIAGNOSIS — K7460 Unspecified cirrhosis of liver: Principal | ICD-10-CM

## 2023-10-03 DIAGNOSIS — R188 Other ascites: Principal | ICD-10-CM

## 2023-10-03 MED ORDER — SPIRONOLACTONE 100 MG TABLET
ORAL_TABLET | Freq: Every day | ORAL | 3 refills | 30.00000 days | Status: CP
Start: 2023-10-03 — End: 2024-01-31

## 2023-10-04 ENCOUNTER — Other Ambulatory Visit

## 2023-10-04 ENCOUNTER — Ambulatory Visit: Admitting: Oncology

## 2023-10-05 DIAGNOSIS — K7460 Unspecified cirrhosis of liver: Principal | ICD-10-CM

## 2023-10-05 DIAGNOSIS — R188 Other ascites: Principal | ICD-10-CM

## 2023-10-06 ENCOUNTER — Inpatient Hospital Stay: Admit: 2023-10-06 | Discharge: 2023-10-06 | Payer: BLUE CROSS/BLUE SHIELD

## 2023-10-06 DIAGNOSIS — K7460 Unspecified cirrhosis of liver: Principal | ICD-10-CM

## 2023-10-06 DIAGNOSIS — R188 Other ascites: Principal | ICD-10-CM

## 2023-10-11 ENCOUNTER — Ambulatory Visit: Admit: 2023-10-11 | Discharge: 2023-10-12 | Payer: BLUE CROSS/BLUE SHIELD

## 2023-10-11 DIAGNOSIS — R188 Other ascites: Principal | ICD-10-CM

## 2023-10-11 DIAGNOSIS — K7460 Unspecified cirrhosis of liver: Principal | ICD-10-CM

## 2023-10-11 DIAGNOSIS — K7581 Nonalcoholic steatohepatitis (NASH): Principal | ICD-10-CM

## 2023-10-11 DIAGNOSIS — I8510 Secondary esophageal varices without bleeding: Principal | ICD-10-CM

## 2023-10-11 MED ORDER — FUROSEMIDE 20 MG TABLET
ORAL_TABLET | Freq: Every day | ORAL | 5 refills | 30.00000 days | Status: CP
Start: 2023-10-11 — End: 2024-04-08

## 2023-10-17 ENCOUNTER — Other Ambulatory Visit: Payer: Self-pay

## 2023-10-17 DIAGNOSIS — K7469 Other cirrhosis of liver: Secondary | ICD-10-CM

## 2023-10-18 ENCOUNTER — Inpatient Hospital Stay: Attending: Oncology

## 2023-10-18 ENCOUNTER — Encounter: Payer: Self-pay | Admitting: Oncology

## 2023-10-18 ENCOUNTER — Inpatient Hospital Stay: Admitting: Oncology

## 2023-10-18 VITALS — BP 129/70 | HR 69 | Temp 96.4°F | Resp 18 | Ht 75.0 in | Wt 284.8 lb

## 2023-10-18 DIAGNOSIS — Z87891 Personal history of nicotine dependence: Secondary | ICD-10-CM | POA: Insufficient documentation

## 2023-10-18 DIAGNOSIS — D696 Thrombocytopenia, unspecified: Secondary | ICD-10-CM | POA: Insufficient documentation

## 2023-10-18 DIAGNOSIS — D61818 Other pancytopenia: Secondary | ICD-10-CM | POA: Insufficient documentation

## 2023-10-18 DIAGNOSIS — Z79899 Other long term (current) drug therapy: Secondary | ICD-10-CM | POA: Diagnosis not present

## 2023-10-18 DIAGNOSIS — K7469 Other cirrhosis of liver: Secondary | ICD-10-CM

## 2023-10-18 LAB — CBC WITH DIFFERENTIAL (CANCER CENTER ONLY)
Abs Immature Granulocytes: 0 K/uL (ref 0.00–0.07)
Basophils Absolute: 0 K/uL (ref 0.0–0.1)
Basophils Relative: 1 %
Eosinophils Absolute: 0 K/uL (ref 0.0–0.5)
Eosinophils Relative: 1 %
HCT: 42.3 % (ref 39.0–52.0)
Hemoglobin: 14 g/dL (ref 13.0–17.0)
Immature Granulocytes: 0 %
Lymphocytes Relative: 21 %
Lymphs Abs: 0.6 K/uL — ABNORMAL LOW (ref 0.7–4.0)
MCH: 30.8 pg (ref 26.0–34.0)
MCHC: 33.1 g/dL (ref 30.0–36.0)
MCV: 93.2 fL (ref 80.0–100.0)
Monocytes Absolute: 0.4 K/uL (ref 0.1–1.0)
Monocytes Relative: 12 %
Neutro Abs: 2 K/uL (ref 1.7–7.7)
Neutrophils Relative %: 65 %
Platelet Count: 38 K/uL — ABNORMAL LOW (ref 150–400)
RBC: 4.54 MIL/uL (ref 4.22–5.81)
RDW: 16.8 % — ABNORMAL HIGH (ref 11.5–15.5)
WBC Count: 3 K/uL — ABNORMAL LOW (ref 4.0–10.5)
nRBC: 0 % (ref 0.0–0.2)

## 2023-10-18 LAB — CMP (CANCER CENTER ONLY)
ALT: 37 U/L (ref 0–44)
AST: 52 U/L — ABNORMAL HIGH (ref 15–41)
Albumin: 3.6 g/dL (ref 3.5–5.0)
Alkaline Phosphatase: 67 U/L (ref 38–126)
Anion gap: 8 (ref 5–15)
BUN: 15 mg/dL (ref 8–23)
CO2: 25 mmol/L (ref 22–32)
Calcium: 9 mg/dL (ref 8.9–10.3)
Chloride: 102 mmol/L (ref 98–111)
Creatinine: 0.98 mg/dL (ref 0.61–1.24)
GFR, Estimated: >60 mL/min (ref >60)
Glucose, Bld: 220 mg/dL — ABNORMAL HIGH (ref 70–99)
Potassium: 4 mmol/L (ref 3.5–5.1)
Sodium: 135 mmol/L (ref 135–145)
Total Bilirubin: 2.5 mg/dL — ABNORMAL HIGH (ref 0.0–1.2)
Total Protein: 6.2 g/dL — ABNORMAL LOW (ref 6.5–8.1)

## 2023-10-18 NOTE — Progress Notes (Signed)
 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 Rudell for pancytopenia.Bone marrow on 12/17/2019 revealed a hypercellular marrow with dysmegakaryocytopoiesis. The features were suggestive of a low-grade myelodysplastic syndrome. Cytogenetics were normal (46, XY).  Bone marrow review at [facility] did not reveal sufficient dysplasia to meet diagnostic criteria for a myelodysplastic syndrome.  The minimal dysplasia, leukopenia and thrombocytopenia in the marrow may be the result of reactive changes.  Early low-grade myelodysplastic syndrome could not be ruled out.  Abdomen and pelvis CT on 01/02/2020 revealed morphologic changes to the liver raising the possibility of cirrhosis. There were sequelae of portal venous hypertension including splenomegaly (15.5 cm) and upper abdominal collateral vessels. There was cholelithiasis. There was mild gallbladder wall thickening which may be secondary to portal venous hypertension/cirrhosis.  Patient follows up with [facility] and [facility] hepatology for his cirrhosis.     Interval history-chronic liver disease is being managed by [facility].  Denies any gum bleeds nosebleeds or bleeding in stool or urine.  ECOG PS- 2 Pain scale- 0   Review of systems- Review of Systems  Constitutional:  Positive for malaise/fatigue. Negative for chills, fever and weight loss.  HENT:  Negative for congestion, ear discharge and nosebleeds.    Eyes:  Negative for blurred vision.  Respiratory:  Negative for cough, hemoptysis, sputum production, shortness of breath and wheezing.   Cardiovascular:  Negative for chest pain, palpitations, orthopnea and claudication.  Gastrointestinal:  Negative for abdominal pain, blood in stool, constipation, diarrhea, heartburn, melena, nausea and vomiting.  Genitourinary:  Negative for dysuria, flank pain, frequency, hematuria and urgency.  Musculoskeletal:  Negative for back pain, joint pain and myalgias.  Skin:  Negative for rash.  Neurological:  Negative for dizziness, tingling, focal weakness, seizures, weakness and headaches.  Endo/Heme/Allergies:  Does not bruise/bleed easily.  Psychiatric/Behavioral:  Negative for depression and suicidal ideas. The patient does not have insomnia.       Allergies  Allergen Reactions   Quetiapine Other (See Comments)    Uncontrolled leg movements  Other reaction(s): Other (See Comments), Other (See Comments), Other (See Comments)  Leg spasms  uncontrollable leg kicking   Sumatriptan Other (See Comments) and Anxiety    shaking  Shaky, body aches   Nsaids Other (See Comments)    Varices, low platelet count   Seroquel [Quetiapine Fumarate] Other (See Comments)    Leg spasms   Tape Itching, Rash and Other (See Comments)     Past Medical History:  Diagnosis Date   A-fib (HCC)    Arthritis    Chronic back pain    Cirrhosis (HCC)    Colon polyps    Diabetes mellitus without complication (HCC)    Gallstones    GERD (gastroesophageal reflux disease)    GIB (gastrointestinal bleeding)    r/t NSAID use   Gout    Headache    Hyperlipidemia    Hypertension    Right-sided Bell's palsy    Sleep apnea    Thrombocytopenia (HCC)    Varices,  gastric      Past Surgical History:  Procedure Laterality Date   ABDOMINAL SURGERY     APPENDECTOMY     BACK SURGERY     lumbar spine fusion in 2012 and 2017   CARDIAC CATHETERIZATION     colectomy  partial w/anastoamosis     COLON SURGERY     COLONOSCOPY WITH PROPOFOL  N/A 01/22/2020   Procedure: COLONOSCOPY WITH PROPOFOL ;  Surgeon: Maryruth Ole DASEN, MD;  Location: [facility] ENDOSCOPY;  Service: Endoscopy;  Laterality: N/A;   ESOPHAGOGASTRODUODENOSCOPY N/A 01/29/2021   Procedure: ESOPHAGOGASTRODUODENOSCOPY (EGD);  Surgeon: Onita Elspeth Sharper, DO;  Location: [facility] ENDOSCOPY;  Service: Gastroenterology;  Laterality: N/A;  DM   ESOPHAGOGASTRODUODENOSCOPY (EGD) WITH PROPOFOL  N/A 01/22/2020   Procedure: ESOPHAGOGASTRODUODENOSCOPY (EGD) WITH PROPOFOL ;  Surgeon: Maryruth Ole DASEN, MD;  Location: [facility] ENDOSCOPY;  Service: Endoscopy;  Laterality: N/A;   ESOPHAGOGASTRODUODENOSCOPY (EGD) WITH PROPOFOL  N/A 07/12/2022   Procedure: ESOPHAGOGASTRODUODENOSCOPY (EGD) WITH PROPOFOL ;  Surgeon: Onita Elspeth Sharper, DO;  Location: [facility] ENDOSCOPY;  Service: Gastroenterology;  Laterality: N/A;   HERNIA REPAIR     JOINT REPLACEMENT     KNEE SURGERY Left    LUMBAR DISC SURGERY  2021   NASAL SINUS SURGERY      Social History   Socioeconomic History   Marital status: Married    Spouse name: Not on file   Number of children: Not on file   Years of education: Not on file   Highest education level: Not on file  Occupational History   Not on file  Tobacco Use   Smoking status: Former   Smokeless tobacco: Never  Vaping Use   Vaping status: Never Used  Substance and Sexual Activity   Alcohol use: Not Currently    Comment: none last 24hrs   Drug use: Never   Sexual activity: Not on file  Other Topics Concern   Not on file  Social History Narrative   Works third shift ar Systems developer.    Social Drivers of Corporate investment banker Strain: Low Risk  (09/01/2023)   Received from [facility]   Overall Financial Resource Strain (CARDIA)    Difficulty of Paying Living Expenses: Not hard at all  Food Insecurity: No Food Insecurity (09/01/2023)    Received from [facility]   Hunger Vital Sign    Within the past 12 months, you worried that your food would run out before you got the money to buy more.: Never true    Within the past 12 months, the food you bought just didn't last and you didn't have money to get more.: Never true  Transportation Needs: No Transportation Needs (09/01/2023)   Received from [facility] - Transportation    In the past 12 months, has lack of transportation kept you from medical appointments or from getting medications?: No    Lack of Transportation (Non-Medical): No  Physical Activity: Insufficiently Active (09/01/2023)   Received from [facility]   Exercise Vital Sign    On average, how many days per week do you engage in moderate to strenuous exercise (like a brisk walk)?: 3 days    On average, how many minutes do you engage in exercise at this level?: 30 min  Stress: Stress Concern Present (09/01/2023)   Received from [facility] - Occupational Stress Questionnaire    Feeling of Stress : To  some extent  Social Connections: Moderately Isolated (09/01/2023)   Received from [facility]   Social Connection and Isolation Panel    In a typical week, how many times do you talk on the phone with family, friends, or neighbors?: More than three times a week    How often do you get together with friends or relatives?: Once a week    How often do you attend church or religious services?: Never    Do you belong to any clubs or organizations such as church groups, unions, fraternal or athletic groups, or school groups?: No    How often do you attend meetings of the clubs or organizations you belong to?: Never    Are you married, widowed, divorced, separated, never married, or living with a partner?: Married  Intimate Partner Violence: Unknown (04/06/2022)   Received from [facility]   HITS    Physically Hurt: Not on file    Insult or Talk Down To: Not on file    Threaten Physical Harm: Not on file    Scream or Curse: Not on file    Family History  Problem Relation Age of Onset   Atrial fibrillation Mother    Diabetes Father      Current Outpatient Medications:    atorvastatin  (LIPITOR) 20 MG tablet, Take 20 mg by mouth daily. (Patient not taking: Reported on 05/03/2023), Disp: , Rfl:    carvedilol  (COREG ) 12.5 MG tablet, Take 12.5 mg by mouth 2 (two) times daily with a meal., Disp: , Rfl:    dutasteride  (AVODART ) 0.5 MG capsule, Take 0.5 mg by mouth daily. (Patient not taking: Reported on 05/03/2023), Disp: , Rfl:    empagliflozin (JARDIANCE) 25 MG TABS tablet, Take 25 mg by mouth daily., Disp: , Rfl:    furosemide  (LASIX ) 20 MG tablet, Take 20 mg by mouth daily as needed for fluid or edema. (Patient not taking: Reported on 05/03/2023), Disp: , Rfl:    HYDROcodone-acetaminophen  (NORCO/VICODIN) 5-325 MG tablet, Take 0.5-1 tablets by mouth 2 (two) times daily as needed for moderate pain (pain score 4-6)., Disp: , Rfl:    lisinopril  (ZESTRIL ) 10 MG tablet, Take 10 mg by mouth daily., Disp: , Rfl:    methocarbamol  (ROBAXIN ) 750 MG tablet, Take 750 mg by mouth every 8 (eight) hours as needed for muscle spasms. (Patient not taking: Reported on 05/03/2023), Disp: , Rfl:    omeprazole (PRILOSEC) 20 MG capsule, Take 20 mg by mouth 2 (two) times daily before a meal. (Patient not taking: Reported on 05/03/2023), Disp: , Rfl:    ondansetron  (ZOFRAN ) 8 MG tablet, Take 8 mg by mouth every 8 (eight) hours as needed for nausea or vomiting., Disp: , Rfl:    pioglitazone (ACTOS) 15 MG tablet, Take 15 mg by mouth daily., Disp: , Rfl:    pregabalin  (LYRICA ) 75 MG capsule, Take 75 mg by mouth See admin instructions. Take 1 capsule (75mg ) by mouth every morning and take 2 capsules (150mg ) by mouth every night at bedtime, Disp: , Rfl:    Semaglutide,0.25 or 0.5MG /DOS, (OZEMPIC, 0.25 OR  0.5 MG/DOSE,) 2 MG/1.5ML SOPN, Inject 0.5 mg into the skin once a week. Last injection was on 01/11/2023,, Disp: , Rfl:   Physical exam: There were no vitals filed for this visit. Physical Exam Cardiovascular:     Rate and Rhythm: Normal rate and regular rhythm.     Heart sounds: Normal heart sounds.  Pulmonary:     Effort: Pulmonary effort is  normal.     Breath sounds: Normal breath sounds.  Skin:    General: Skin is warm and dry.  Neurological:     Mental Status: He is alert and oriented to person, place, and time.      I have personally reviewed labs listed below:    Latest Ref Rng & Units 05/03/2023   10:10 AM  CMP  Glucose 70 - 99 mg/dL 666   BUN 8 - 23 mg/dL 16   Creatinine 9.38 - 1.24 mg/dL 9.44   Sodium 864 - 854 mmol/L 135   Potassium 3.5 - 5.1 mmol/L 4.7   Chloride 98 - 111 mmol/L 105   CO2 22 - 32 mmol/L 24   Calcium  8.9 - 10.3 mg/dL 9.0   Total Protein 6.5 - 8.1 g/dL 6.3   Total Bilirubin 0.0 - 1.2 mg/dL 2.6   Alkaline Phos 38 - 126 U/L 51   AST 15 - 41 U/L 47   ALT 0 - 44 U/L 38       Latest Ref Rng & Units 10/18/2023    3:03 PM  CBC  WBC 4.0 - 10.5 K/uL 3.0   Hemoglobin 13.0 - 17.0 g/dL 85.9   Hematocrit 60.9 - 52.0 % 42.3   Platelets 150 - 400 K/uL 38       Assessment and plan- Patient is a 64 y.o. 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  Platelet counts have been mostly between 40s to 50s.  Today they are a little lower at 38.  This still can be monitored conservatively and there is no indication for a bone marrow biopsy at this time.  CBC with differential in 4 months in 8 months and I will see him back in 8 months.  I will also check ferritin and iron  studies in 4 months.  HCC surveillance and management of cirrhosis as per [facility]   Visit Diagnosis 1. Pancytopenia (HCC)   2. Other cirrhosis of liver (HCC)       Dr. Annah Skene, MD, MPH [facility] [phone] 10/18/2023 3:00 PM

## 2023-10-19 LAB — AFP TUMOR MARKER: AFP, Serum, Tumor Marker: 2.1 ng/mL (ref 0.0–8.4)

## 2023-10-19 NOTE — Addendum Note (Signed)
 Addended by: FAUSTINO BENCE on: 10/19/2023 11:23 AM   Modules accepted: Orders

## 2023-10-23 ENCOUNTER — Inpatient Hospital Stay: Admit: 2023-10-23 | Discharge: 2023-10-23 | Payer: BLUE CROSS/BLUE SHIELD

## 2023-10-26 DIAGNOSIS — R188 Other ascites: Principal | ICD-10-CM

## 2023-10-26 DIAGNOSIS — K7460 Unspecified cirrhosis of liver: Principal | ICD-10-CM

## 2023-11-08 ENCOUNTER — Ambulatory Visit
Admit: 2023-11-08 | Discharge: 2023-11-08 | Payer: BLUE CROSS/BLUE SHIELD | Attending: Student in an Organized Health Care Education/Training Program | Primary: Student in an Organized Health Care Education/Training Program

## 2023-11-08 ENCOUNTER — Ambulatory Visit: Admit: 2023-11-08 | Discharge: 2023-11-08 | Payer: BLUE CROSS/BLUE SHIELD

## 2023-11-08 DIAGNOSIS — K7581 Nonalcoholic steatohepatitis (NASH): Principal | ICD-10-CM

## 2023-11-08 DIAGNOSIS — R600 Localized edema: Principal | ICD-10-CM

## 2023-11-08 MED ORDER — SPIRONOLACTONE 100 MG TABLET
ORAL_TABLET | Freq: Every day | ORAL | 3 refills | 20.00000 days | Status: CP
Start: 2023-11-08 — End: 2024-03-07

## 2023-11-08 MED ORDER — FUROSEMIDE 40 MG TABLET
ORAL_TABLET | Freq: Two times a day (BID) | ORAL | 5 refills | 30.00000 days | Status: CP
Start: 2023-11-08 — End: 2024-05-06

## 2023-11-10 DIAGNOSIS — R197 Diarrhea, unspecified: Principal | ICD-10-CM

## 2023-11-14 ENCOUNTER — Inpatient Hospital Stay: Admit: 2023-11-14 | Discharge: 2023-11-14 | Payer: BLUE CROSS/BLUE SHIELD

## 2023-11-23 ENCOUNTER — Ambulatory Visit: Admit: 2023-11-23 | Discharge: 2023-11-24 | Payer: BLUE CROSS/BLUE SHIELD

## 2023-12-19 ENCOUNTER — Ambulatory Visit: Admit: 2023-12-19 | Discharge: 2023-12-20 | Payer: BLUE CROSS/BLUE SHIELD

## 2024-01-10 ENCOUNTER — Ambulatory Visit
Admit: 2024-01-10 | Discharge: 2024-01-11 | Payer: BLUE CROSS/BLUE SHIELD | Attending: Gastroenterology | Primary: Gastroenterology

## 2024-01-10 DIAGNOSIS — K7460 Unspecified cirrhosis of liver: Principal | ICD-10-CM

## 2024-01-10 DIAGNOSIS — K7581 Nonalcoholic steatohepatitis (NASH): Principal | ICD-10-CM

## 2024-01-10 DIAGNOSIS — R188 Other ascites: Principal | ICD-10-CM

## 2024-01-10 MED ORDER — LACTULOSE 20 GRAM/30 ML ORAL SOLUTION
Freq: Two times a day (BID) | ORAL | 11 refills | 17.00000 days | Status: CP
Start: 2024-01-10 — End: 2024-01-10

## 2024-01-24 ENCOUNTER — Ambulatory Visit
Admit: 2024-01-24 | Discharge: 2024-01-25 | Payer: BLUE CROSS/BLUE SHIELD | Attending: Student in an Organized Health Care Education/Training Program | Primary: Student in an Organized Health Care Education/Training Program

## 2024-01-24 DIAGNOSIS — E66812 Obesity, Class II, BMI 35-39.9: Principal | ICD-10-CM

## 2024-01-24 DIAGNOSIS — E1165 Type 2 diabetes mellitus with hyperglycemia: Principal | ICD-10-CM

## 2024-01-24 DIAGNOSIS — K7581 Nonalcoholic steatohepatitis (NASH): Principal | ICD-10-CM

## 2024-01-24 MED ORDER — MOUNJARO 2.5 MG/0.5 ML SUBCUTANEOUS PEN INJECTOR
SUBCUTANEOUS | 0 refills | 28.00000 days | Status: CP
Start: 2024-01-24 — End: 2024-02-15

## 2024-02-12 MED ORDER — SPIRONOLACTONE 100 MG TABLET
ORAL_TABLET | Freq: Every day | ORAL | 0 refills | 0.00000 days
Start: 2024-02-12 — End: ?

## 2024-02-13 MED ORDER — SPIRONOLACTONE 100 MG TABLET
ORAL_TABLET | Freq: Every day | ORAL | 3 refills | 30.00000 days | Status: CP
Start: 2024-02-13 — End: ?

## 2024-02-14 ENCOUNTER — Ambulatory Visit
Admit: 2024-02-14 | Discharge: 2024-02-15 | Payer: BLUE CROSS/BLUE SHIELD | Attending: Student in an Organized Health Care Education/Training Program | Primary: Student in an Organized Health Care Education/Training Program

## 2024-02-14 DIAGNOSIS — D696 Thrombocytopenia, unspecified: Principal | ICD-10-CM

## 2024-02-14 DIAGNOSIS — K4090 Unilateral inguinal hernia, without obstruction or gangrene, not specified as recurrent: Principal | ICD-10-CM

## 2024-02-14 DIAGNOSIS — K7460 Unspecified cirrhosis of liver: Principal | ICD-10-CM

## 2024-02-14 MED ORDER — DOPTELET (15 TAB PACK) 20 MG TABLET
ORAL_TABLET | ORAL | 0 refills | 0.00000 days | Status: CP
Start: 2024-02-14 — End: ?

## 2024-02-17 ENCOUNTER — Inpatient Hospital Stay: Attending: Oncology

## 2024-02-17 ENCOUNTER — Other Ambulatory Visit

## 2024-02-17 DIAGNOSIS — D696 Thrombocytopenia, unspecified: Principal | ICD-10-CM

## 2024-02-17 DIAGNOSIS — K7460 Unspecified cirrhosis of liver: Principal | ICD-10-CM

## 2024-02-17 DIAGNOSIS — D61818 Other pancytopenia: Secondary | ICD-10-CM

## 2024-02-17 LAB — CBC WITH DIFFERENTIAL (CANCER CENTER ONLY)
Abs Immature Granulocytes: 0 K/uL (ref 0.00–0.07)
Basophils Absolute: 0 K/uL (ref 0.0–0.1)
Basophils Relative: 1 %
Eosinophils Absolute: 0.1 K/uL (ref 0.0–0.5)
Eosinophils Relative: 2 %
HCT: 45 % (ref 39.0–52.0)
Hemoglobin: 15.4 g/dL (ref 13.0–17.0)
Immature Granulocytes: 0 %
Lymphocytes Relative: 25 %
Lymphs Abs: 0.7 K/uL (ref 0.7–4.0)
MCH: 31.2 pg (ref 26.0–34.0)
MCHC: 34.2 g/dL (ref 30.0–36.0)
MCV: 91.1 fL (ref 80.0–100.0)
Monocytes Absolute: 0.2 K/uL (ref 0.1–1.0)
Monocytes Relative: 8 %
Neutro Abs: 1.8 K/uL (ref 1.7–7.7)
Neutrophils Relative %: 64 %
Platelet Count: 43 K/uL — ABNORMAL LOW (ref 150–400)
RBC: 4.94 MIL/uL (ref 4.22–5.81)
RDW: 15.7 % — ABNORMAL HIGH (ref 11.5–15.5)
WBC Count: 2.9 K/uL — ABNORMAL LOW (ref 4.0–10.5)
nRBC: 0 % (ref 0.0–0.2)

## 2024-02-17 LAB — IRON AND TIBC
Iron: 105 ug/dL (ref 45–182)
Saturation Ratios: 27 % (ref 17.9–39.5)
TIBC: 386 ug/dL (ref 250–450)
UIBC: 281 ug/dL

## 2024-02-17 LAB — FERRITIN: Ferritin: 104 ng/mL (ref 24–336)

## 2024-02-18 ENCOUNTER — Other Ambulatory Visit: Payer: Self-pay

## 2024-02-18 ENCOUNTER — Emergency Department

## 2024-02-18 ENCOUNTER — Emergency Department: Admission: EM | Admit: 2024-02-18 | Discharge: 2024-02-18 | Disposition: A | Discharge status: Home / Self Care

## 2024-02-18 DIAGNOSIS — S0502XA Injury of conjunctiva and corneal abrasion without foreign body, left eye, initial encounter: Secondary | ICD-10-CM | POA: Insufficient documentation

## 2024-02-18 DIAGNOSIS — H1132 Conjunctival hemorrhage, left eye: Secondary | ICD-10-CM | POA: Insufficient documentation

## 2024-02-18 DIAGNOSIS — Y92410 Unspecified street and highway as the place of occurrence of the external cause: Secondary | ICD-10-CM | POA: Diagnosis not present

## 2024-02-18 DIAGNOSIS — I10 Essential (primary) hypertension: Secondary | ICD-10-CM | POA: Diagnosis not present

## 2024-02-18 DIAGNOSIS — E1165 Type 2 diabetes mellitus with hyperglycemia: Secondary | ICD-10-CM | POA: Insufficient documentation

## 2024-02-18 DIAGNOSIS — Z96652 Presence of left artificial knee joint: Secondary | ICD-10-CM | POA: Insufficient documentation

## 2024-02-18 MED ORDER — TETRACAINE HCL 0.5 % OP SOLN
1.0000 [drp] | Freq: Once | OPHTHALMIC | Status: AC
  Administered 2024-02-18: 1 [drp] via OPHTHALMIC
  Filled 2024-02-18: qty 4

## 2024-02-18 MED ORDER — FLUORESCEIN SODIUM 1 MG OP STRP
2.0000 | ORAL_STRIP | Freq: Once | OPHTHALMIC | Status: AC
  Administered 2024-02-18: 2 via OPHTHALMIC
  Filled 2024-02-18: qty 2

## 2024-02-18 MED ORDER — ERYTHROMYCIN 5 MG/GM OP OINT: 1.0000 | TOPICAL_OINTMENT | Freq: Every day | OPHTHALMIC | 0 refills | Status: AC

## 2024-02-18 NOTE — Discharge Instructions (Addendum)
 Your CT scans and x-ray are normal.  You are found to have a scratch on your eye.  Please follow-up with your outpatient provider.  Please return for any new, worsening, or changing symptoms or other concerns.  It was a pleasure caring for you today.
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
## 2024-02-21 MED ORDER — MOUNJARO 5 MG/0.5 ML SUBCUTANEOUS PEN INJECTOR
SUBCUTANEOUS | 0 refills | 28.00000 days | Status: CP
Start: 2024-02-21 — End: 2024-03-14

## 2024-02-29 ENCOUNTER — Encounter: Payer: Self-pay | Admitting: Oncology

## 2024-03-07 DIAGNOSIS — D696 Thrombocytopenia, unspecified: Secondary | ICD-10-CM

## 2024-03-07 DIAGNOSIS — K7460 Unspecified cirrhosis of liver: Principal | ICD-10-CM

## 2024-03-07 MED ORDER — DOPTELET (15 TAB PACK) 20 MG TABLET
ORAL_TABLET | ORAL | 0 refills | 0.00000 days | Status: CP
Start: 2024-03-07 — End: ?

## 2024-06-15 ENCOUNTER — Ambulatory Visit: Admitting: Oncology

## 2024-06-15 ENCOUNTER — Other Ambulatory Visit
